# Patient Record
Sex: Female | Born: 1935
Health system: Southern US, Community
[De-identification: ages and names within clinical notes are randomized; demographics above are authoritative.]

## PROBLEM LIST (undated history)

## (undated) ENCOUNTER — Emergency Department (HOSPITAL_COMMUNITY): Admission: EM | Payer: Medicare Other | Source: Home / Self Care

## (undated) DIAGNOSIS — C801 Malignant (primary) neoplasm, unspecified: Secondary | ICD-10-CM

## (undated) DIAGNOSIS — I639 Cerebral infarction, unspecified: Secondary | ICD-10-CM

## (undated) DIAGNOSIS — K219 Gastro-esophageal reflux disease without esophagitis: Secondary | ICD-10-CM

## (undated) DIAGNOSIS — F419 Anxiety disorder, unspecified: Secondary | ICD-10-CM

## (undated) DIAGNOSIS — I10 Essential (primary) hypertension: Secondary | ICD-10-CM

## (undated) DIAGNOSIS — E079 Disorder of thyroid, unspecified: Secondary | ICD-10-CM

## (undated) HISTORY — PX: CHOLECYSTECTOMY: SHX55

## (undated) HISTORY — PX: MASTECTOMY: SHX3

---

## 1997-10-22 ENCOUNTER — Ambulatory Visit (HOSPITAL_COMMUNITY): Admission: RE | Admit: 1997-10-22 | Discharge: 1997-10-22 | Payer: Self-pay | Admitting: *Deleted

## 1998-02-27 ENCOUNTER — Other Ambulatory Visit: Admission: RE | Admit: 1998-02-27 | Discharge: 1998-02-27 | Payer: Self-pay | Admitting: *Deleted

## 1998-06-30 ENCOUNTER — Emergency Department (HOSPITAL_COMMUNITY): Admission: EM | Admit: 1998-06-30 | Discharge: 1998-06-30 | Payer: Self-pay | Admitting: Emergency Medicine

## 1998-06-30 ENCOUNTER — Encounter: Payer: Self-pay | Admitting: Emergency Medicine

## 2000-09-10 ENCOUNTER — Emergency Department (HOSPITAL_COMMUNITY): Admission: EM | Admit: 2000-09-10 | Discharge: 2000-09-10 | Payer: Self-pay | Admitting: Emergency Medicine

## 2000-10-14 ENCOUNTER — Encounter: Payer: Self-pay | Admitting: Specialist

## 2000-10-14 ENCOUNTER — Ambulatory Visit (HOSPITAL_COMMUNITY): Admission: RE | Admit: 2000-10-14 | Discharge: 2000-10-14 | Payer: Self-pay | Admitting: Specialist

## 2000-10-29 ENCOUNTER — Ambulatory Visit (HOSPITAL_COMMUNITY): Admission: RE | Admit: 2000-10-29 | Discharge: 2000-10-29 | Payer: Self-pay | Admitting: Specialist

## 2000-10-29 ENCOUNTER — Encounter: Payer: Self-pay | Admitting: Specialist

## 2003-10-29 ENCOUNTER — Encounter: Admission: RE | Admit: 2003-10-29 | Discharge: 2003-12-31 | Payer: Self-pay | Admitting: Orthopaedic Surgery

## 2003-11-14 ENCOUNTER — Emergency Department (HOSPITAL_COMMUNITY): Admission: EM | Admit: 2003-11-14 | Discharge: 2003-11-14 | Payer: Self-pay | Admitting: Emergency Medicine

## 2004-04-22 ENCOUNTER — Encounter: Admission: RE | Admit: 2004-04-22 | Discharge: 2004-06-18 | Payer: Self-pay | Admitting: Orthopaedic Surgery

## 2004-05-29 ENCOUNTER — Ambulatory Visit: Payer: Self-pay | Admitting: Psychiatry

## 2004-07-09 ENCOUNTER — Emergency Department (HOSPITAL_COMMUNITY): Admission: EM | Admit: 2004-07-09 | Discharge: 2004-07-09 | Payer: Self-pay | Admitting: Emergency Medicine

## 2004-07-24 ENCOUNTER — Ambulatory Visit: Payer: Self-pay | Admitting: Psychiatry

## 2004-09-09 ENCOUNTER — Encounter: Admission: RE | Admit: 2004-09-09 | Discharge: 2004-09-09 | Payer: Self-pay | Admitting: Family Medicine

## 2004-09-30 ENCOUNTER — Ambulatory Visit (HOSPITAL_COMMUNITY): Admission: RE | Admit: 2004-09-30 | Discharge: 2004-09-30 | Payer: Self-pay | Admitting: Gastroenterology

## 2004-10-21 ENCOUNTER — Ambulatory Visit: Payer: Self-pay | Admitting: Psychiatry

## 2004-12-18 ENCOUNTER — Ambulatory Visit: Payer: Self-pay | Admitting: Psychiatry

## 2005-02-19 ENCOUNTER — Ambulatory Visit: Payer: Self-pay | Admitting: Psychiatry

## 2005-04-29 ENCOUNTER — Emergency Department (HOSPITAL_COMMUNITY): Admission: EM | Admit: 2005-04-29 | Discharge: 2005-04-29 | Payer: Self-pay | Admitting: Emergency Medicine

## 2005-05-24 ENCOUNTER — Ambulatory Visit: Payer: Self-pay | Admitting: Psychiatry

## 2005-07-21 ENCOUNTER — Ambulatory Visit (HOSPITAL_COMMUNITY): Payer: Self-pay | Admitting: Psychiatry

## 2005-08-20 ENCOUNTER — Ambulatory Visit (HOSPITAL_COMMUNITY): Payer: Self-pay | Admitting: Psychiatry

## 2005-10-20 ENCOUNTER — Ambulatory Visit (HOSPITAL_COMMUNITY): Payer: Self-pay | Admitting: Psychiatry

## 2005-12-17 ENCOUNTER — Ambulatory Visit (HOSPITAL_COMMUNITY): Payer: Self-pay | Admitting: Psychiatry

## 2006-01-19 ENCOUNTER — Ambulatory Visit (HOSPITAL_COMMUNITY): Payer: Self-pay | Admitting: Psychiatry

## 2006-06-24 ENCOUNTER — Encounter: Admission: RE | Admit: 2006-06-24 | Discharge: 2006-06-24 | Payer: Self-pay | Admitting: Family Medicine

## 2006-07-06 ENCOUNTER — Ambulatory Visit (HOSPITAL_COMMUNITY): Payer: Self-pay | Admitting: Psychiatry

## 2006-10-07 ENCOUNTER — Ambulatory Visit (HOSPITAL_COMMUNITY): Payer: Self-pay | Admitting: Psychiatry

## 2006-10-25 ENCOUNTER — Encounter: Admission: RE | Admit: 2006-10-25 | Discharge: 2006-10-25 | Payer: Self-pay | Admitting: Orthopedic Surgery

## 2006-11-19 ENCOUNTER — Inpatient Hospital Stay (HOSPITAL_COMMUNITY): Admission: RE | Admit: 2006-11-19 | Discharge: 2006-11-25 | Payer: Self-pay | Admitting: Orthopedic Surgery

## 2007-01-06 ENCOUNTER — Encounter: Admission: RE | Admit: 2007-01-06 | Discharge: 2007-03-01 | Payer: Self-pay | Admitting: Orthopedic Surgery

## 2007-01-11 ENCOUNTER — Ambulatory Visit (HOSPITAL_COMMUNITY): Payer: Self-pay | Admitting: Psychiatry

## 2007-08-05 ENCOUNTER — Encounter
Admission: RE | Admit: 2007-08-05 | Discharge: 2007-11-03 | Payer: Self-pay | Admitting: Physical Medicine & Rehabilitation

## 2007-08-09 ENCOUNTER — Ambulatory Visit: Payer: Self-pay | Admitting: Physical Medicine & Rehabilitation

## 2007-08-09 ENCOUNTER — Ambulatory Visit (HOSPITAL_COMMUNITY)
Admission: RE | Admit: 2007-08-09 | Discharge: 2007-08-09 | Payer: Self-pay | Admitting: Physical Medicine & Rehabilitation

## 2007-09-27 ENCOUNTER — Ambulatory Visit: Payer: Self-pay | Admitting: Physical Medicine & Rehabilitation

## 2007-10-24 ENCOUNTER — Ambulatory Visit: Payer: Self-pay | Admitting: Physical Medicine & Rehabilitation

## 2007-11-22 ENCOUNTER — Encounter
Admission: RE | Admit: 2007-11-22 | Discharge: 2008-02-20 | Payer: Self-pay | Admitting: Physical Medicine & Rehabilitation

## 2007-11-24 ENCOUNTER — Ambulatory Visit: Payer: Self-pay | Admitting: Physical Medicine & Rehabilitation

## 2007-12-22 ENCOUNTER — Ambulatory Visit: Payer: Self-pay | Admitting: Physical Medicine & Rehabilitation

## 2008-01-19 ENCOUNTER — Ambulatory Visit: Payer: Self-pay | Admitting: Physical Medicine & Rehabilitation

## 2008-01-24 ENCOUNTER — Ambulatory Visit (HOSPITAL_COMMUNITY)
Admission: RE | Admit: 2008-01-24 | Discharge: 2008-01-24 | Payer: Self-pay | Admitting: Physical Medicine & Rehabilitation

## 2008-02-29 ENCOUNTER — Encounter
Admission: RE | Admit: 2008-02-29 | Discharge: 2008-04-09 | Payer: Self-pay | Admitting: Physical Medicine & Rehabilitation

## 2008-03-05 ENCOUNTER — Ambulatory Visit: Payer: Self-pay | Admitting: Physical Medicine & Rehabilitation

## 2008-04-09 ENCOUNTER — Encounter
Admission: RE | Admit: 2008-04-09 | Discharge: 2008-04-09 | Payer: Self-pay | Admitting: Physical Medicine & Rehabilitation

## 2008-06-21 ENCOUNTER — Emergency Department (HOSPITAL_COMMUNITY): Admission: EM | Admit: 2008-06-21 | Discharge: 2008-06-21 | Payer: Self-pay | Admitting: Emergency Medicine

## 2008-06-30 ENCOUNTER — Emergency Department (HOSPITAL_COMMUNITY): Admission: EM | Admit: 2008-06-30 | Discharge: 2008-06-30 | Payer: Self-pay | Admitting: Emergency Medicine

## 2008-11-05 ENCOUNTER — Emergency Department (HOSPITAL_COMMUNITY): Admission: EM | Admit: 2008-11-05 | Discharge: 2008-11-05 | Payer: Self-pay | Admitting: Emergency Medicine

## 2009-06-06 ENCOUNTER — Ambulatory Visit (HOSPITAL_COMMUNITY): Admission: RE | Admit: 2009-06-06 | Discharge: 2009-06-06 | Payer: Self-pay | Admitting: Family Medicine

## 2009-06-14 ENCOUNTER — Ambulatory Visit (HOSPITAL_COMMUNITY): Admission: RE | Admit: 2009-06-14 | Discharge: 2009-06-15 | Payer: Self-pay | Admitting: General Surgery

## 2009-06-14 ENCOUNTER — Encounter (INDEPENDENT_AMBULATORY_CARE_PROVIDER_SITE_OTHER): Payer: Self-pay | Admitting: General Surgery

## 2009-10-12 ENCOUNTER — Inpatient Hospital Stay (HOSPITAL_COMMUNITY): Admission: EM | Admit: 2009-10-12 | Discharge: 2009-10-17 | Payer: Self-pay | Admitting: Emergency Medicine

## 2009-10-15 ENCOUNTER — Ambulatory Visit: Payer: Self-pay | Admitting: Psychiatry

## 2009-10-16 ENCOUNTER — Ambulatory Visit: Payer: Self-pay | Admitting: Psychiatry

## 2010-04-27 ENCOUNTER — Encounter: Payer: Self-pay | Admitting: Orthopedic Surgery

## 2010-04-27 ENCOUNTER — Encounter: Payer: Self-pay | Admitting: Family Medicine

## 2010-05-20 ENCOUNTER — Emergency Department (HOSPITAL_COMMUNITY)
Admission: EM | Admit: 2010-05-20 | Discharge: 2010-05-20 | Disposition: A | Discharge status: Home / Self Care | Payer: Medicare Other | Attending: Emergency Medicine | Admitting: Emergency Medicine

## 2010-05-20 DIAGNOSIS — Z79899 Other long term (current) drug therapy: Secondary | ICD-10-CM | POA: Insufficient documentation

## 2010-05-20 DIAGNOSIS — F411 Generalized anxiety disorder: Secondary | ICD-10-CM | POA: Insufficient documentation

## 2010-05-20 DIAGNOSIS — M549 Dorsalgia, unspecified: Secondary | ICD-10-CM | POA: Insufficient documentation

## 2010-05-20 DIAGNOSIS — L089 Local infection of the skin and subcutaneous tissue, unspecified: Secondary | ICD-10-CM | POA: Insufficient documentation

## 2010-05-20 DIAGNOSIS — G8929 Other chronic pain: Secondary | ICD-10-CM | POA: Insufficient documentation

## 2010-06-22 LAB — URINALYSIS, ROUTINE W REFLEX MICROSCOPIC
Glucose, UA: NEGATIVE mg/dL
Hgb urine dipstick: NEGATIVE
Nitrite: NEGATIVE
Protein, ur: NEGATIVE mg/dL
Specific Gravity, Urine: 1.006 (ref 1.005–1.030)

## 2010-06-22 LAB — CARDIAC PANEL(CRET KIN+CKTOT+MB+TROPI)
CK, MB: 0.9 ng/mL (ref 0.3–4.0)
Relative Index: INVALID (ref 0.0–2.5)
Relative Index: INVALID (ref 0.0–2.5)
Total CK: 63 U/L (ref 7–177)
Total CK: 69 U/L (ref 7–177)
Troponin I: <0.01 ng/mL (ref 0.00–0.06)

## 2010-06-22 LAB — POCT I-STAT, CHEM 8
Chloride: 100 mEq/L (ref 96–112)
HCT: 44 % (ref 36.0–46.0)
Hemoglobin: 15 g/dL (ref 12.0–15.0)
Potassium: 3.9 mEq/L (ref 3.5–5.1)
Sodium: 137 mEq/L (ref 135–145)

## 2010-06-22 LAB — BASIC METABOLIC PANEL
Calcium: 9.4 mg/dL (ref 8.4–10.5)
GFR calc non Af Amer: >60 mL/min (ref >60)
Glucose, Bld: 109 mg/dL — ABNORMAL HIGH (ref 70–99)
Sodium: 136 mEq/L (ref 135–145)

## 2010-06-22 LAB — COMPREHENSIVE METABOLIC PANEL
ALT: 13 U/L (ref 0–35)
Albumin: 3.7 g/dL (ref 3.5–5.2)
Alkaline Phosphatase: 50 U/L (ref 39–117)
BUN: 6 mg/dL (ref 6–23)
GFR calc Af Amer: >60 mL/min (ref >60)
Potassium: 3.6 mEq/L (ref 3.5–5.1)
Sodium: 137 mEq/L (ref 135–145)
Total Protein: 6.6 g/dL (ref 6.0–8.3)

## 2010-06-22 LAB — CBC
HCT: 41 % (ref 36.0–46.0)
Hemoglobin: 14 g/dL (ref 12.0–15.0)
Hemoglobin: 14.1 g/dL (ref 12.0–15.0)
MCH: 31.7 pg (ref 26.0–34.0)
MCHC: 33.7 g/dL (ref 30.0–36.0)
MCV: 92.5 fL (ref 78.0–100.0)
Platelets: 170 10*3/uL (ref 150–400)
Platelets: 170 10*3/uL (ref 150–400)
RBC: 4.43 MIL/uL (ref 3.87–5.11)
RDW: 13.1 % (ref 11.5–15.5)
RDW: 13.9 % (ref 11.5–15.5)
WBC: 3.3 10*3/uL — ABNORMAL LOW (ref 4.0–10.5)
WBC: 4.2 10*3/uL (ref 4.0–10.5)

## 2010-06-22 LAB — T3, FREE: T3, Free: 2.8 pg/mL (ref 2.3–4.2)

## 2010-06-22 LAB — DIFFERENTIAL
Basophils Relative: 1 % (ref 0–1)
Eosinophils Absolute: 0.1 10*3/uL (ref 0.0–0.7)
Lymphs Abs: 0.7 10*3/uL (ref 0.7–4.0)
Neutro Abs: 2.3 10*3/uL (ref 1.7–7.7)
Neutrophils Relative %: 70 % (ref 43–77)

## 2010-06-22 LAB — URINE CULTURE: Colony Count: 35000

## 2010-06-22 LAB — POCT CARDIAC MARKERS
CKMB, poc: <1 ng/mL — ABNORMAL LOW (ref 1.0–8.0)
Myoglobin, poc: 68.2 ng/mL (ref 12–200)

## 2010-06-27 LAB — URINALYSIS, ROUTINE W REFLEX MICROSCOPIC
Glucose, UA: NEGATIVE mg/dL
Nitrite: NEGATIVE
pH: 8 (ref 5.0–8.0)

## 2010-06-27 LAB — URINE MICROSCOPIC-ADD ON

## 2010-06-27 LAB — CBC
RBC: 3.76 MIL/uL — ABNORMAL LOW (ref 3.87–5.11)
WBC: 3.3 10*3/uL — ABNORMAL LOW (ref 4.0–10.5)

## 2010-06-27 LAB — GLUCOSE, CAPILLARY
Glucose-Capillary: 156 mg/dL — ABNORMAL HIGH (ref 70–99)
Glucose-Capillary: 88 mg/dL (ref 70–99)
Glucose-Capillary: 91 mg/dL (ref 70–99)

## 2010-06-27 LAB — BASIC METABOLIC PANEL
Calcium: 9.4 mg/dL (ref 8.4–10.5)
Creatinine, Ser: 0.94 mg/dL (ref 0.4–1.2)
GFR calc Af Amer: >60 mL/min (ref >60)
GFR calc non Af Amer: 59 mL/min — ABNORMAL LOW (ref >60)

## 2010-07-12 LAB — BASIC METABOLIC PANEL
CO2: 30 mEq/L (ref 19–32)
Chloride: 98 mEq/L (ref 96–112)
GFR calc Af Amer: >60 mL/min (ref >60)
Sodium: 136 mEq/L (ref 135–145)

## 2010-07-12 LAB — DIFFERENTIAL
Basophils Relative: 1 % (ref 0–1)
Eosinophils Absolute: 0.1 10*3/uL (ref 0.0–0.7)
Eosinophils Relative: 2 % (ref 0–5)
Monocytes Absolute: 0.3 10*3/uL (ref 0.1–1.0)
Monocytes Relative: 8 % (ref 3–12)

## 2010-07-12 LAB — URINALYSIS, ROUTINE W REFLEX MICROSCOPIC
Bilirubin Urine: NEGATIVE
Hgb urine dipstick: NEGATIVE
Protein, ur: NEGATIVE mg/dL
Urobilinogen, UA: 1 mg/dL (ref 0.0–1.0)

## 2010-07-12 LAB — CBC
Hemoglobin: 12 g/dL (ref 12.0–15.0)
MCHC: 32.4 g/dL (ref 30.0–36.0)
MCV: 91.8 fL (ref 78.0–100.0)
RBC: 4.03 MIL/uL (ref 3.87–5.11)

## 2010-07-17 LAB — LIPASE, BLOOD: Lipase: 23 U/L (ref 11–59)

## 2010-07-17 LAB — URINE MICROSCOPIC-ADD ON

## 2010-07-17 LAB — URINALYSIS, ROUTINE W REFLEX MICROSCOPIC
Leukocytes, UA: NEGATIVE
Nitrite: NEGATIVE
Nitrite: POSITIVE — AB
Specific Gravity, Urine: <1.005 — ABNORMAL LOW (ref 1.005–1.030)
Specific Gravity, Urine: <1.005 — ABNORMAL LOW (ref 1.005–1.030)
Urobilinogen, UA: 0.2 mg/dL (ref 0.0–1.0)
pH: 5.5 (ref 5.0–8.0)
pH: 7 (ref 5.0–8.0)

## 2010-07-17 LAB — COMPREHENSIVE METABOLIC PANEL
AST: 19 U/L (ref 0–37)
CO2: 25 mEq/L (ref 19–32)
Calcium: 8.6 mg/dL (ref 8.4–10.5)
Creatinine, Ser: 0.6 mg/dL (ref 0.4–1.2)
GFR calc Af Amer: >60 mL/min (ref >60)
GFR calc non Af Amer: >60 mL/min (ref >60)

## 2010-07-17 LAB — CBC
MCHC: 33.8 g/dL (ref 30.0–36.0)
MCV: 94.3 fL (ref 78.0–100.0)
RBC: 3.34 MIL/uL — ABNORMAL LOW (ref 3.87–5.11)

## 2010-07-17 LAB — DIFFERENTIAL
Eosinophils Relative: 0 % (ref 0–5)
Lymphocytes Relative: 15 % (ref 12–46)
Lymphs Abs: 0.7 10*3/uL (ref 0.7–4.0)
Neutro Abs: 3.5 10*3/uL (ref 1.7–7.7)
Neutrophils Relative %: 78 % — ABNORMAL HIGH (ref 43–77)

## 2010-07-17 LAB — URINE CULTURE

## 2010-07-17 LAB — AMMONIA: Ammonia: 10 umol/L — ABNORMAL LOW (ref 11–35)

## 2010-08-19 NOTE — Procedures (Signed)
Alexis Reese, SAYED                ACCOUNT NO.:  [id number]   MEDICAL RECORD NO.:  [id number]         PATIENT TYPE:  AECP   LOCATION:                                 FACILITY:   PHYSICIAN:  Erick Colace, M.D.DATE OF BIRTH:  April 04, 1935   DATE OF PROCEDURE:  DATE OF DISCHARGE:                               OPERATIVE REPORT   This is a left L3-4 transforaminal lumbar epidural steroid injection  under fluoroscopic guidance.   INDICATIONS:  Left greater than right-sided back, hip, and lower  extremity pain.  Pain is only partially responsive to medication  management and other conservative care.  Has only had partial response  to sacroiliac injection as well as with trochanteric bursa injection.  Pain interferes with self-care mobility.  She had no significant relief  with facet injections either.  Last lumbar imaging study did show  lateral recess stenosis at L2-3, right greater than left, bilateral at  L3-4 and L5-S1   Informed consent was obtained after describing the risks and benefits of  the procedure with the patient.  These include bleeding, bruising, and  infection.  She elects to proceed and has given written consent.  The  patient placed prone on fluoroscopy table.  Betadine prep, sterilely  draped.  A 25-gauge, 1-1/2 inch needle was used to anesthetize the skin  and subcutaneous tissue with 1% lidocaine x2 mL, and 22-gauge 3-1/2-inch  spinal needle was inserted at left L3-4 intervertebral foramen, AP,  lateral, and oblique imaging utilized.  Initially used the more superior  approach, but excess of osteophytes therefore made foraminal approach.  After appropriate fluoroscopic images were obtained, Omnipaque 180  showed good nerve roots as well as epidural spread, followed by  injection of 1 mL of 10 mg/mL dexamethasone and 1.5 mL of 1% MPF  lidocaine.  The patient tolerated the procedure well.  Post-injection  instructions given.      Erick Colace, M.D.  Electronically Signed     AEK/MEDQ  D:  12/22/2007 12:32:51  T:  12/23/2007 00:29:16  Job:  [id number]   cc:   Ursula Beath, MD  Fax: [phone]

## 2010-08-19 NOTE — Discharge Summary (Signed)
NAMELIBORIA, PUTNAM                ACCOUNT NO.:  [id number]   MEDICAL RECORD NO.:  [id number]          PATIENT TYPE:  INP   LOCATION:  1616                         FACILITY:  [facility]   PHYSICIAN:  Marlowe Kays, M.D.  DATE OF BIRTH:  [birth year]   DATE OF ADMISSION:  11/19/2006  DATE OF DISCHARGE:  11/25/2006                               DISCHARGE SUMMARY   PREOPERATIVE DIAGNOSIS:  Spinal and foraminal stenosis, L2-3 to sacrum,  with possible disk herniation at L2-3 left.   POSTOPERATIVE DIAGNOSIS:  Spinal stenosis, L2 to sacrum.   OTHER DIAGNOSIS:  Psychosocial disorder with depression and anxiety.   OPERATION:  On November 19, 2006, central and foraminal decompression, L2  to sacrum, with inspection of L2-3 disk left.   HOSPITAL COURSE:  This 76 year old female who lives with her brother was  admitted at this time following the above surgery for back and bilateral  leg pain.  The surgery went uneventfully; however, she has been very  slow to ambulate and has become a management problem here in the  hospital because of inability to be rehabbed.  She lives with her  brother, who will be unable to take care of her.  Because of this, we  are making arrangements for her to spend 30 days or less at a skilled  nursing facility until she is independent enough to return home with her  brother.   DISCHARGE MEDICATIONS:  Basically those that she came in on:   1. Hydrocodone one to two q.4h. p.r.n. pain.  2. Simvastatin 40 mg each morning.  3. Avandamet 07/998 mg a.m. and p.m.  4. Gabapentin 300 mg t.i.d.  5. Clonazepam 0.5 mg t.i.d.  6. Paroxetine HCl 30 mg a.m. and p.m.  7. Seroquel 200 mg h.s.  8. Seroquel 50 mg one-half to one tablet once during day if needed.  9. Benazepril HCl 20 mg at noon.  10.Etodolac 500 mg noon and h.s.  11.Alphagan P 0.15% one drop each eye a.m. and p.m.  12.Lumigan 0.03% eye drops one drop each eye at bedtime.   DISCHARGE PLAN:  Return to my office 2  weeks from surgery.  She may be  showering at this time and ambulatory as tolerated.   CONDITION AT DISCHARGE:  Stable.           ______________________________  Marlowe Kays, M.D.     JA/MEDQ  D:  11/25/2006  T:  11/25/2006  Job:  [id number]

## 2010-08-19 NOTE — Assessment & Plan Note (Signed)
HISTORY:  A 76 year old female with history of lumbar pain.  She has had  central and foraminal decompression in 2008.  She has had pain post  procedure.  She has had mainly left-sided lumbar and left hip pain.  She  did have left hip x-ray that I ordered performed on Aug 09, 2007, which  was normal.  She has had lumbar spine imaging demonstrating old T8  compression fracture and old L2 compression fracture.  She has  degenerative changes at L5-S1 level with anterior slip of L5 on S1.  She  has some osteopenia noted.   She had only about one day relief with the trochanteric bursa injection,  left hip.   She has had no new problems in the interval time.  Taking some Tylenol.  Her sleep is good.  She does need a walker to ambulate, needs some help  with shopping, meal prep, household duties, bathing, toileting, and  dressing.   REVIEW OF SYSTEMS:  Positive for constipation.   SOCIAL HISTORY:  Widow, lives with her brother.   PHYSICAL EXAMINATION:  VITALS:  Blood pressure is 133/63, pulse 99,  respirations 18, and O2 sat 96% on room air.   On reviewing her x-ray of the lumbar spine, she does have wide  laminectomy noted posteriorly at L2, L3, L4, and L5 levels.  She has  facet arthropathy.  SI joints appear sclerotic, right greater than left.   On examination, she has tenderness to palpation at lumbar paraspinals  and over the left trochanteric bursa area.  She has 74% forward flexion  and 50% extension pain with extension on bending towards the left side.  The lower extremity strength is 4/5 bilaterally and symmetric.  She has  some proximal hip weakness, unable to climb a step.   IMPRESSION:  1. Lumbar spondylosis without myelopathy.  2. Lumbar post laminectomy pain syndrome.   PLAN:  1. We will go ahead and do lumbar medial branch blocks at L4-L5 and L5-      S1 levels.  2. Increase tramadol to q.i.d. 50 mg.  She may take up to 500 mg      Tylenol with liquids per  day.      Erick Colace, M.D.  Electronically Signed     AEK/MedQ  D:  09/27/2007 12:18:36  T:  09/29/2007 06:02:37  Job #:  [id number]   cc:   Ursula Beath, MD  Fax: [phone]

## 2010-08-19 NOTE — Group Therapy Note (Signed)
REASON FOR CONSULTATION:  Lumbar disc disease, osteopenia.   HISTORY:  Patient is a 76 year old female who is a rather poor  historian.  She gets off track and I need to ask her repeated questions.  She had her brother help her with the health and history form.  He  indicates the onset of patient's current complaints which are back pain  and left lower extremity pain starting in 2004.  However, Ms. Alexis Reese  stated it started just prior to her lumbar surgery in 2008.  In either  case, she denies a case of trauma to her spine, no falls, no motor  vehicle accidents.  Her primary complaint, as noted above, is back pain,  left hip as well as left lower extremity pain.  She describes the pain  as an aching pain, does not really put a number on it in terms of  severity.  She could walk 5-10 minutes at a time.  She does not climb  steps since her surgery.  She has been using a walker since her surgery  and she does not drive.  She is retired.  She indicates  disability  since 2004 which is basically at age 75.  She needs assistance with  household duties.   REVIEW OF SYSTEMS:  Positive for trouble walking, depression, anxiety,  constipation, poor appetite.   PRIOR STUDIES:  Nerve study, x-ray, CT, MRI, bone scan.  I do not have  copies of all this.  I was able to look some up in the [facility]-chart.   She has an operative port.  Aug 19, 2006, procedure central and  foraminal decompression L2 through the sacrum, Gill procedure at L5.  This is per Dr. Simonne Come with assistance from Dr. Darrelyn Hillock.   Imaging studies reviewed included diagnostic lumbar spine from the OR  November 19, 2006.  She has had a CT of her lumbar spine of her contrast  which is preoperative showing lateral recess stenosis right greater than  left, at L2-3 retrolisthesis 3-4 mm, at L3-4 mild narrowing of both  lateral recesses without definite neural compression at L4-5, mild  narrowing of lateral  recesses but no definite compression, in L5-S1  anterolisthesis a 12 mm stenosis canal and lateral recesses.  She had  compression fractures L2 and L4.  Prior studies also include MRI of the  right hip and chondroma present, no left hip MRI.  She has mild facet  arthropathy bilaterally noted on MRI of lumbar spine in 2002.  She has  had bone density showing osteopenia in lumbar and left hip.  She has had  no fractures in the hip area but as noted per CT, what appeared to be  old fractures at L2 and L4.   CURRENT MEDICATIONS:  1. Amitriptyline 30 nightly.  2. Darvocet-N 100 four tablets per day which helps for 4-5 hours at a      time.  3. Zocor for cholesterol.  4. Avandamet for diabetes.  5. Seroquel for psychiatric issues.  6. Clonazepam for anxiety.  7. Fluoxetine for anxiety/depression.  8. Benazepril for hypertension.   ALLERGIES:  None known.   SOCIAL HISTORY:  Single, lives with her brother.  She is actually  widowed per her report.   FAMILY HISTORY:  Diabetes in father.  Cancer in brother.   PHYSICAL EXAMINATION:  VITAL SIGNS:  Blood pressure 128/58, pulse 100,  respiratory rate 18, O2 saturation 97% on room air.  GENERAL APPEARANCE:  Patient  is an elderly female, appearing somewhat  older than her stated age.  NEUROLOGIC:  Slow to respond to questions, need to repeat but does not  appear to be hard of hearing.  She is anxious.  Her gait is with a  walker but she was able to without the walker within the exam room short  distance, short step length, widened base of support.  Motor strength is  5/5 bilateral deltoid, biceps, triceps, grip as well as hip flexors,  knee extensors, ankle dorsiflexor.  Lumbar range of motion is  essentially nil in terms of extension and 50% flexion.  Her sensation is  normal bilateral upper and lower extremities.  Deep tendon reflexes are  normal bilateral upper and lower extremities.  Strength is normal  bilateral upper and lower  extremities.  She has no pain with hip range  of motion bilaterally, internal and external rotation only mildly  reduced bilaterally.  She has tenderness over the left greater  trochanter but not on the right side.   IMPRESSION:  History of lumbar stenosis with chronic left lower  extremity postoperative pain.  Her exam shows no focal neurologic  deficits.  She does have tenderness to palpation over the trochanteric  bursa and she may have trochanteric bursitis which we will inject today,  which will also serve as a diagnostic clue as well.  Check hip x-ray on  the left as well as low back x-ray to see if she has any new compression  fractures.  Need MRI to further assess age of fractures should pain  persist.   I will see her back in about a month.  We will change her from  amitriptyline to nortriptyline to reduce anticholinergic side effects.  Bump it up to 50 mg.  Change Darvocet to tramadol 50 t.i.d.   Thank you for this interesting consultation.  Will keep you apprised of  her situation.  Additional considerations would be to do a sacroiliac  injection to see if this may be a pain generator versus facet injections  which did show some arthropathy on CT scan.      Erick Colace, M.D.  Electronically Signed     AEK/MedQ  D:  08/09/2007 16:25:01  T:  08/09/2007 16:54:46  Job #:  [id number]   cc:   Ursula Beath, MD  Fax: [phone]   Marlowe Kays, M.D.  Fax: [phone]

## 2010-08-19 NOTE — Procedures (Signed)
[name], Alexis Reese                ACCOUNT NO.:  [id number]   MEDICAL RECORD NO.:  [id number]           PATIENT TYPE:   LOCATION:                                 FACILITY:   PHYSICIAN:  Erick Colace, M.D.DATE OF BIRTH:  09-19-1934   DATE OF PROCEDURE:  02/16/2008  DATE OF DISCHARGE:                               OPERATIVE REPORT   This is an S1 transforaminal lumbar epidural steroid injection under  fluoroscopic guidance.   INDICATIONS:  Lumbosacral radiculitis.  History of postlaminectomy  syndrome with left lower extremity pain and foraminal narrowing at L5  nerve root.   On fluoroscopy view, appears that the foramen would be non-accessible by  transforaminal route.  She has scarring from laminectomy midline, and  therefore S1 route was selected.   Informed consent was obtained after describing risks and benefits of the  procedure with the patient.  These include bleeding, bruising, and  infection.  Her pain is only partially relieved by medication management  including narcotic analgesics and interferes with self-care and  mobility.   The patient placed prone on fluoroscopy table.  Betadine prep, sterile  drape.  A 25-gauge inch and a half needle was used to anesthetize the  skin and subcutaneous tissue with 1% lidocaine x2 mL.  Then, a 22-gauge  3-1/2-inch spinal needle was inserted in the left S1 foramen.  AP and  lateral imaging utilized.  Omnipaque 180 under live fluoro demonstrated  good nerve root spread as well as foraminal spread followed by injection  of 1 mL of 1% MPF lidocaine and 1 mL of 10 mg/mL dexamethasone.  The  patient tolerated the procedure well.  Pre and postinjection vitals  stable.  Good post injection relief ~50%.  Postinjection instructions  given.  She will see me in follow up in 1 month.      Erick Colace, M.D.  Electronically Signed     AEK/MEDQ  D:  03/05/2008 13:45:27  T:  03/06/2008 03:37:45  Job:  [id number]

## 2010-08-19 NOTE — Procedures (Signed)
[name], Alexis Reese                ACCOUNT NO.:  [id number]   MEDICAL RECORD NO.:  [id number]         PATIENT TYPE:  AECP   LOCATION:                                 FACILITY:   PHYSICIAN:  Erick Colace, M.D.DATE OF BIRTH:  1934/06/26   DATE OF PROCEDURE:  01/19/2008  DATE OF DISCHARGE:                               OPERATIVE REPORT   PROCEDURE:  Left L2-L3 transforaminal lumbar epidural steroid injection  under fluoroscopic guidance.   INDICATIONS:  Left lumbar radiculitis with back and left hip pain.  X-  ray of the left hip is negative.   Informed consent was obtained after describing risks and benefits of the  procedure with the patient.  These include bleeding, bruising, and  infection.  She elects to proceed and has given written consent.  The  patient placed prone on fluoroscopy table.  Betadine prep, sterile  drape.  A 25-gauge 1-1/2 inch needle was used to anesthetize skin and  subcu tissue, 1% lidocaine x2 mL.  Then, a 22-gauge 3-1/2-inch spinal  needle was inserted in left L2-3 intervertebral foramen retroneural  approach.  AP, lateral, and oblique images were utilized.  Omnipaque 180  under live fluoro demonstrated good nerve root as well as medial spread.  Then, a solution containing 1 mL of 10 mg/mL dexamethasone and 1 mL of  1% lidocaine injected MPF.  The patient tolerated the procedure well.  Pre- and postinjection vitals stable.  Postinjection instructions given.      Erick Colace, M.D.  Electronically Signed     AEK/MEDQ  D:  01/19/2008 12:10:12  T:  01/20/2008 01:01:23  Job:  [id number]

## 2010-08-19 NOTE — Procedures (Signed)
NAMENOVEMBER, SYPHER                ACCOUNT NO.:  [id number]   MEDICAL RECORD NO.:  [id number]          PATIENT TYPE:  REC   LOCATION:  TPC                          FACILITY:  [facility]   PHYSICIAN:  Erick Colace, M.D.DATE OF BIRTH:  08/07/1934   DATE OF PROCEDURE:  08/09/2007  DATE OF DISCHARGE:                               OPERATIVE REPORT   This is left hip trochanteric bursa injection.   INDICATION:  Trochanteric bursitis with pain only partial responsive to  medication management.  She is diabetic and elderly and poor candidate  for nonsteroidals.   Informed consent was obtained after describing risks and benefits of the  procedure to the patient.  These include bleeding, bruising or  infection.  She elects to proceed and has given written consent.  The  patient placed in the right lateral decubitus position.  Area marked and  prepped with Betadine with assistance of nursing, entered with a 25-  gauge 1-1/2-inch needle inserted to bone, contact slightly withdrawn.  Then, a solution containing 1 mL of 40 mL per mL Depo-Medrol and 4 mL of  1% lidocaine injected.  The patient tolerated the procedure well.  Post  injection structures given.  I will see her back in approximately one  month to see corticosteroid effect.  However, immediate post injection  affect is minimal in terms of alleviating the left hip pain which leads  me to believe it may be more sacroiliac in nature.      Erick Colace, M.D.  Electronically Signed     AEK/MEDQ  D:  08/09/2007 16:27:57  T:  08/09/2007 17:46:44  Job:  [id number]

## 2010-08-19 NOTE — Procedures (Signed)
Alexis Reese, CHAI                ACCOUNT NO.:  [id number]   MEDICAL RECORD NO.:  [id number]           PATIENT TYPE:   LOCATION:                                 FACILITY:   PHYSICIAN:  Erick Colace, M.D.DATE OF BIRTH:  03-27-1935   DATE OF PROCEDURE:  10/24/2007  DATE OF DISCHARGE:                               OPERATIVE REPORT   PROCEDURE:  Left L5 dorsal ramus injection, left L4 medial branch block,  left L3 medial branch block, under fluoroscopic guidance.   INDICATION:  Lumbar post-laminectomy pain syndrome with facet  arthropathy.  Medial branch blocks performed to further assess pain.  She has only had 1-day of relief with trochanteric bursa injection.  She  takes medications for analgesia with only partial relief.  Pain  interferes with self-care mobility.   Informed consent was obtained after describing risks and benefits of the  procedure to the patient.  These include bleeding, bruising, and  infection.  She elects to proceed and has given written consent.  The  patient placed prone on fluoroscopy table.  Betadine prep, sterile  draped.  A 25-gauge 1-1/2-inch needle was used to anesthetize skin and  subcu tissue with 1% lidocaine x2 mL at each of 3 sites.  Then, a 22-  gauge 3-1/2-inch spinal needle was inserted under fluoroscopic guidance  targeting first at the left S1 SAP sacroiliac junction.  Bone contact  made and confirmed with lateral imaging.  Omnipaque 180 x 0.5 mL  demonstrated no intravascular uptake and 0.5 mL of 2% MPF lidocaine  injected.  Then, the left L5 SAP transverse process junction was  targeted.  Bone contact made and confirmed with lateral imaging.  Omnipaque 180 x 0.5 mL demonstrated no intravascular uptake and 0.5 mL  of 2% lidocaine solution injected.  Then, the left L4 SAP transverse  process junction was targeted.  Bone contact made and confirmed with  lateral imaging.  Omnipaque 180 x 0.5 mL demonstrated no intravascular  uptake, 0.5  mL of the 2% lidocaine solution was injected.  The patient  tolerated the procedure well.  Pre- and post-injection, vitals stable.  Post-injection instructions given.  We will schedule for repeat  injection.  If this is not particularly helpful, consider sacroiliac.      Erick Colace, M.D.  Electronically Signed     AEK/MEDQ  D:  10/24/2007 14:24:41  T:  10/25/2007 05:49:38  Job:  [id number]

## 2010-08-19 NOTE — H&P (Signed)
NAMEZAKYA, HALABI                ACCOUNT NO.:  [id number]   MEDICAL RECORD NO.:  [id number]          PATIENT TYPE:  INP   LOCATION:  NA                           FACILITY:  [facility]   PHYSICIAN:  Marlowe Kays, M.D.  DATE OF BIRTH:  Mar 03, 1935   DATE OF ADMISSION:  11/19/2006  DATE OF DISCHARGE:                              HISTORY & PHYSICAL   CHIEF COMPLAINT:  Pain in my legs and back.   PRESENT ILLNESS:  A 76 year old white female seen by [place] for continued  progressive problems concerning pain into her lumbar spine and lower  extremities.  We have been treating her conservatively, but her pain is  so interfering with her lifestyle.  She has difficulty sleeping,  difficulty getting about.  CT scan done on October 25, 2006 shows spinal  stenosis at all levels from L2-3 all the way to the sacrum.  After much  discussion to the risks and benefits with the patient and her brother,  who accompanies her in all visits, it was decided that she would benefit  from surgical intervention with a decompressive foraminotomy and a  lumbar laminectomy from L2-3 to the sacrum.   PAST MEDICAL HISTORY:  This lady has a mild psychosocial disorder, has a  considerable amount of depression as well as anxiety.  With the upcoming  surgery, of course, her anxiety is enhanced.  Dr. Raquel James in  [place] is her family physician.  She had no medical allergies.   CURRENT MEDICATIONS:  1. Simvastatin 40 mg 1 daily.  2. Avandamet 4 mg/1000 mg tab b.i.d.  3. Gabapentin 300 mg caps b.i.d.,  4. Clonazepam 0.5 mg t.i.d.  5. Paroxetine hydrochloride 30 mg tabs b.i.d.  6. Seroquel 200 mg tab at bedtime daily.  7. Benazepril hydrochloride 20 mg daily.  8. Etodolac 500 mg b.i.d., which she will stop prior to surgery.  9. She also has eye drops which presumably are for some mild glaucoma:      Alphagan 15% 1 drop each eye at 12 hours; Lumigan  0.03%, 1 drop      each eye daily.   PAST SURGERIES:  Breast surgery  in 1985 for cancer, which has no  sequelae.   FAMILY HISTORY:  Positive for diabetes in the father, pancreatic cancer  in the brother.   SOCIAL HISTORY:  She is widowed, retired.  No intake of alcohol or  tobacco products.  She lives at home, and the [employer] [facility] will tend for her after surgery.   REVIEW OF SYSTEMS:  CNS: No seizures or paralysis, numbness, or double  vision.  RESPIRATORY:  No productive cough, no hemoptysis, no shortness  of breath.  CARDIOVASCULAR:  No chest pain, no angina or orthopnea.  GASTROINTESTINAL: No nausea, vomiting, melena, or bloody stools.  She  does have intermittent diarrhea.  GENITOURINARY:  No discharge, dysuria,  or hematuria.  MUSCULOSKELETAL:  Primarily in present illness.   PHYSICAL EXAMINATION:  GENERAL:  Alert, cooperative, friendly, very  anxious and tearful 76 year old white female, who is accompanied by her  brother.  VITAL SIGNS: Blood  pressure 140/86, pulse 80, respirations 12.  HEENT:  Normocephalic.  PERRLA.  EOM intact.  Oropharynx is clear.  Pupils are very small.  NECK:  Supple, no lymphadenopathy.  CHEST:  Clear to auscultation.  No rhonchi, no rales.  HEART:  Regular rate and rhythm.  No murmurs are heard.  ABDOMEN:  Soft, nontender.  Liver and spleen not felt.  GENITALIA/RECTAL/PELVIC/BREASTS:  Not done, not pertinent to present  illness.  EXTREMITIES:  Primarily as in present illness.  There are no  deformities.  Negative straight-leg raise.  Minimal numbness on the left  calf area.   ADMISSION DIAGNOSES:  1. Spinal stenosis L2-S1.  2. Hyperlipidemia.  3. Hypertension.  4. Anxiety and depression.  5. Diabetes (?).   PLAN:  The patient will be admitted for central and foraminal  decompressive lumbar laminectomy from L2-S1.      Dooley L. Cherlynn June.    ______________________________  Marlowe Kays, M.D.    DLU/MEDQ  D:  11/12/2006  T:  11/13/2006  Job:  [id number]   cc:   Ursula Beath,  MD  Fax: [phone]

## 2010-08-19 NOTE — Op Note (Signed)
NAMEJAMIELYNN, WIGLEY                ACCOUNT NO.:  [id number]   MEDICAL RECORD NO.:  [id number]          PATIENT TYPE:  INP   LOCATION:  0002                         FACILITY:  [facility]   PHYSICIAN:  Marlowe Kays, M.D.  DATE OF BIRTH:  May 12, 1934   DATE OF PROCEDURE:  11/19/2006  DATE OF DISCHARGE:                               OPERATIVE REPORT   PREOPERATIVE DIAGNOSES:  1. Spinal and foraminal stenosis L2-3, L3-4, L4-5, and L5-S1.  2. Suspected disk herniation with free fragment L2-3 left.  3. Pars defect spondylolisthesis L5 and S1.   POSTOPERATIVE DIAGNOSES:  1. Spinal and foraminal stenosis L2-3, L3-4, L4-5, and L5-S1.  2. No herniated disk found at L2-3.  3. Pars defect spondylolisthesis L5 and S1.   OPERATION:  Central and foraminal decompression L2 to the sacrum with  Gill procedure at L5 and inspection of L2-3 disk left with no disk  herniation or free fragment found.   SURGEON:  Marlowe Kays, M.D.   ASSISTANT:  Georges Lynch. Darrelyn Hillock, M.D.   ANESTHESIA:  General.   INDICATIONS FOR PROCEDURE:  She was having back and bilateral leg pain,  left greater than right.  Myelogram CT scan has demonstrated  particularly lateral recess stenosis at every level from L2 to the  sacrum but also central stenosis as well and what was felt to be on CAT  scan a disk herniation with free fragment caudally at L2-3 on the left.  She was having more left leg pain than right.   PROCEDURE:  Prophylactic antibiotics, satisfactory general anesthesia,  Foley catheter inserted, prone position on the Wilson frame.  Back was  prepped with DuraPrep, draped in sterile field.  IV employed.  Vertical  midline incision down to the spinous processes with initial localizing x-  ray showing the Kocher clamps, apparently beyond L2 and L4.  Based on  this, we continued dissecting soft tissue off the neural arches going  proximal ward and distal ward over the sacrum.  Two self-retaining  McCullough retractors  were placed.  I then began decompressive  procedure, removing the spinous processes and neural arches at L3, 4 and  5.  At L5, we had to perform a Gill procedure because the neural arch  was very loose.  Working proximally, we took a third x-ray with a second  x-ray in between during the case for localization to attempt to localize  the L2-3 disk which was located based on the x-ray with a probe at L3-4,  and we moved more cephalad to the next interspace, removing additional  bone, essentially all of L2.  The L3 nerve root was freed up and the  foramen identified and was easily movable.  There was no encumbrance on  it.  The L2-3 disk appeared to be firm, and there was no free fragment  noted.  At this point, wound was irrigated with sterile saline, Gelfoam  soaked in thrombin was placed over the dura and self-retaining  retractors removed.  I then placed a quarter inch Penrose drain with a  safety pin to the right posterior buttock area and carefully  closed the  wound around it with interrupted #1 Vicryl in the fascia and deep  subcutaneous tissue with 2-0 Vicryl superficial subcutaneous tissue  staples in the skin.  Betadine and dry sterile dressing were applied.  She was  given 15 mg of Toradol IV and was taken to recovery in satisfactory  condition with no known complications.  Estimated blood loss was between  900 and 1000 mL, and she started with a hemoglobin of 11.3, and we  planned to give her 2 units of blood.           ______________________________  Marlowe Kays, M.D.     JA/MEDQ  D:  11/19/2006  T:  11/20/2006  Job:  [id number]

## 2010-08-19 NOTE — Procedures (Signed)
NAMEWILENE, Alexis Reese                ACCOUNT NO.:  [id number]   MEDICAL RECORD NO.:  [id number]          PATIENT TYPE:  REC   LOCATION:  TPC                          FACILITY:  [facility]   PHYSICIAN:  Erick Colace, M.D.DATE OF BIRTH:  1935/04/06   DATE OF PROCEDURE:  DATE OF DISCHARGE:                               OPERATIVE REPORT   PROCEDURE:  This is a left sacroiliac injection under fluoroscopic  guidance.   INDICATIONS:  Left hip and buttock pain did not have much relief with  left L5 dorsal ramus, L4 medial branch, and L3 medial branch performed  October 24, 2007.  Pain persists despite medication management.   Informed consent was obtained after describing risks and benefits of the  procedure with the patient.  These include bleeding, bruising, and  infection.  She elects to proceed and has given written consent.  The  patient placed prone on the fluoroscopy table.  Betadine prep, sterile  drape.  A 25-gauge 1-1/2-inch needle was used to anesthetize the skin  and subcutaneous tissue with 1% lidocaine x2 mL.  Then, a 25-gauge 3-  inch spinal needle was inserted under fluoroscopic guidance starting at  the left sacroiliac joint.  AP, lateral, and oblique imaging utilized.  Omnipaque 180 on live fluoro demonstrated good intravascular uptake.  Then, a solution containing 1 mL of 2% lidocaine and 0.5 mL of 40 mg/mL  Depo-Medrol were injected.  The patient tolerated the procedure well.  Pre and post injection vitals stable.  Post injection instructions  given.  We will see her back in 1 month for re-injection.      Erick Colace, M.D.  Electronically Signed     AEK/MEDQ  D:  11/24/2007 13:33:46  T:  11/25/2007 04:33:33  Job:  [id number]

## 2010-08-22 NOTE — Consult Note (Signed)
Alexis Reese, IVENS NO.:  [id number]   MEDICAL RECORD NO.:  [id number]                   PATIENT TYPE:  EMS   LOCATION:  ED                                   FACILITY:  [facility]   PHYSICIAN:  Vania Rea, M.D.              DATE OF BIRTH:  May 21, 1934   DATE OF CONSULTATION:  11/14/2003  DATE OF DISCHARGE:                                   CONSULTATION   PHYSICIANS:  Primary care physician:  Teena Irani. Arlyce Dice, M.D.  Requesting physician:  Hilario Quarry, M.D.  Consulting physician:  Vania Rea, M.D.   REASON FOR CONSULTATION:  Garbled speech since this morning.   HISTORY OF PRESENT ILLNESS:  This is a 76 year old lady with a history of  unclear psychiatric issues that involves depression.  She is on Seroquel and  Paxil.  She is disabled because of psychiatric reasons.  She is a widow with  two children, lives at home with her brother, and requires a sitter for four  hours every day.  The patient was apparently able to self care until this  morning when she woke up at 10 o'clock, her usual time, and exhibited  garbled speech.  The patient was also confused about what to do, was unable  to wipe herself after using the bathroom.  The patient is prone to attacks  of anxiety by her own admission and also according to her brother.  Currently, in attempting to interview the patient, she does not make sense  most of the time, and she is using either neologisms or is just confused or  is unable to get out the words she really needs to get out.   It is to be noted that, when the nurse was placing a Foley catheter in the  patient, under the stress of the placement of the Foley catheter, her speech  became perfectly clear, and she expressed her desires and feelings with  extreme clarity.  There is no history of fever, cough, cold, or chest pains.  There is no history of focal weakness, syncope, or seizures.  She does have  episodic headaches.  She has no  past history of strokes.   PAST MEDICAL HISTORY:  1. Cancer of the breast in 1995 with no history of recurrence.  2. Chronic depression.  3. Recurrent anxiety at times.   MEDICATIONS:  Neurontin, Fosamax, Paxil, Seroquel, and Xanax.   ALLERGIES:  No known drug allergies.   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  Lives at home with her  brother here in [place].  Denies any history of tobacco, alcohol, or drug  use.   FAMILY HISTORY:  Significant for mother who died at the age of 70 from  Alzheimer's, a father who died at 96 from a stroke.  Eight siblings, five  sisters and three brothers.  A sister died of esophageal cancer, brother  died of pancreatic cancer.  One brother is diabetic.  No psychiatric  problems in the family.   REVIEW OF SYMPTOMS:  Significant only headaches and constipation.  Her  brother denies any abnormality in any other system.   PHYSICAL EXAMINATION:  GENERAL:  Obese, agitated, elderly lady sitting up in  the stretcher.  VITAL SIGNS:  Temperature 99.5, pulse 97, respirations 24, blood pressure  124/90.  Saturating at 98%.  HEENT:  Pupils round, equal, and reactive.  NECK:  There is no lymphadenopathy.  She has a thick neck.  CHEST:  Clear to auscultation bilaterally.  CARDIOVASCULAR:  Regular rhythm without murmur.  ABDOMEN:  Obese, soft, and nontender.  There are no masses.  EXTREMITIES:  Without edema.  She has 2+ pulses bilaterally.  NEUROLOGIC:  She has grade 5 power throughout, and there is loss of  sensation.  She has no focal neurologic deficits.  Her speech remains  garbled.  Her swallowing and gag reflexes are intact.   LABORATORY AND X-RAY DATA:  CBC is completely normal with a white count of  5.5, hematocrit 41.2, and MCV 91, platelets 202.  Likewise, her serum  chemistries are also completely normal with potassium 3.7, sodium 137, BUN  9, creatinine  0.7.  Glucose is somewhat elevated at 188.  Liver function  tests are normal.  Acetaminophen, alcohol, and salicylate were undetectable.   IMPRESSION:  This represents probably an acute anxiety attack.   PLAN:  1. Will get an ABG and give her some Ativan.  2. However, we will check her thyroid function with free T4 and TSH in case     of some abnormality there.  3. We will also do a B12.  4. We will also try to get a STAT MRI to be sure there is no problem there.  5. CT scan of her brain ordered and revealed no acute abnormality.  6. Will also get a STAT consult from the ACT team.  If they consider to be     an acute psychiatric problem, then we will clear her for transfer to a     psychiatric facility.      ___________________________________________                                            Vania Rea, M.D.   LC/MEDQ  D:  11/14/2003  T:  11/14/2003  Job:  [id number]   cc:   Teena Irani. Arlyce Dice, M.D.  [address]  [place]  [place] [address]  Fax: [phone]   Hilario Quarry, M.D.  [address]  [place]  [place] [address]  Fax: [phone]

## 2010-08-22 NOTE — Procedures (Signed)
[name], Alexis Reese                          ACCOUNT NO.:  [id number]   MEDICAL RECORD NO.:  [id number]                   PATIENT TYPE:  EMS   LOCATION:  ED                                   FACILITY:  [facility]   PHYSICIAN:  Edward L. Juanetta Gosling, M.D.             DATE OF BIRTH:  [birth year]   DATE OF PROCEDURE:  DATE OF DISCHARGE:                                EKG INTERPRETATION   DATE AND TIME OF PROCEDURE:  At 1519 on November 14, 2003.   The rhythm is sinus rhythm with a rate in the 90s.  There is a great deal of  artifact in the tracing.  The computer has read PACs and PVCs which I do not  see.  There is an intraventricular conduction delay.  The computer has also  read right and left atrial enlargement.  I can not see the P waves well  enough to be certain about that.  Abnormal electrocardiogram.      ___________________________________________                                            Oneal Deputy. Juanetta Gosling, M.D.   ELH/MEDQ  D:  11/14/2003  T:  11/14/2003  Job:  [id number]

## 2011-01-03 ENCOUNTER — Encounter: Payer: Self-pay | Admitting: *Deleted

## 2011-01-03 ENCOUNTER — Emergency Department (HOSPITAL_COMMUNITY): Payer: Medicare Other

## 2011-01-03 ENCOUNTER — Emergency Department (HOSPITAL_COMMUNITY)
Admission: EM | Admit: 2011-01-03 | Discharge: 2011-01-03 | Disposition: A | Discharge status: Home / Self Care | Payer: Medicare Other | Attending: Emergency Medicine | Admitting: Emergency Medicine

## 2011-01-03 DIAGNOSIS — N760 Acute vaginitis: Secondary | ICD-10-CM | POA: Insufficient documentation

## 2011-01-03 DIAGNOSIS — R1032 Left lower quadrant pain: Secondary | ICD-10-CM | POA: Insufficient documentation

## 2011-01-03 HISTORY — DX: Essential (primary) hypertension: I10

## 2011-01-03 HISTORY — DX: Gastro-esophageal reflux disease without esophagitis: K21.9

## 2011-01-03 HISTORY — DX: Malignant (primary) neoplasm, unspecified: C80.1

## 2011-01-03 LAB — URINALYSIS, ROUTINE W REFLEX MICROSCOPIC
Bilirubin Urine: NEGATIVE
Ketones, ur: NEGATIVE mg/dL
Nitrite: NEGATIVE
Protein, ur: NEGATIVE mg/dL
Urobilinogen, UA: 0.2 mg/dL (ref 0.0–1.0)

## 2011-01-03 LAB — COMPREHENSIVE METABOLIC PANEL
ALT: 14 U/L (ref 0–35)
Alkaline Phosphatase: 76 U/L (ref 39–117)
BUN: 11 mg/dL (ref 6–23)
CO2: 29 mEq/L (ref 19–32)
Chloride: 96 mEq/L (ref 96–112)
GFR calc Af Amer: >60 mL/min (ref >60)
GFR calc non Af Amer: >60 mL/min (ref >60)
Glucose, Bld: 104 mg/dL — ABNORMAL HIGH (ref 70–99)
Potassium: 4.2 mEq/L (ref 3.5–5.1)
Sodium: 134 mEq/L — ABNORMAL LOW (ref 135–145)
Total Bilirubin: 0.4 mg/dL (ref 0.3–1.2)
Total Protein: 7.4 g/dL (ref 6.0–8.3)

## 2011-01-03 LAB — DIFFERENTIAL
Eosinophils Absolute: 0 10*3/uL (ref 0.0–0.7)
Lymphocytes Relative: 18 % (ref 12–46)
Lymphs Abs: 1 10*3/uL (ref 0.7–4.0)
Monocytes Relative: 7 % (ref 3–12)
Neutrophils Relative %: 74 % (ref 43–77)

## 2011-01-03 LAB — CBC
Hemoglobin: 14.7 g/dL (ref 12.0–15.0)
MCH: 30.6 pg (ref 26.0–34.0)
RBC: 4.81 MIL/uL (ref 3.87–5.11)
WBC: 5.5 10*3/uL (ref 4.0–10.5)

## 2011-01-03 LAB — URINE MICROSCOPIC-ADD ON

## 2011-01-03 LAB — LIPASE, BLOOD: Lipase: 33 U/L (ref 11–59)

## 2011-01-03 MED ORDER — SODIUM CHLORIDE 0.9 % IV SOLN
999.0000 mL | INTRAVENOUS | Status: DC
  Administered 2011-01-03: 1000 mL via INTRAVENOUS

## 2011-01-03 MED ORDER — ONDANSETRON HCL 4 MG/2ML IJ SOLN
4.0000 mg | Freq: Once | INTRAMUSCULAR | Status: AC
  Administered 2011-01-03: 4 mg via INTRAVENOUS
  Filled 2011-01-03: qty 2

## 2011-01-03 MED ORDER — FENTANYL CITRATE 0.05 MG/ML IJ SOLN
50.0000 ug | Freq: Once | INTRAMUSCULAR | Status: AC
  Administered 2011-01-03: 50 ug via INTRAVENOUS
  Filled 2011-01-03: qty 2

## 2011-01-03 MED ORDER — HYDROCODONE-ACETAMINOPHEN 5-325 MG PO TABS
2.0000 | ORAL_TABLET | ORAL | Status: AC | PRN
Start: 2011-01-03 — End: 2011-01-13

## 2011-01-03 MED ORDER — HYDROCORTISONE 1 % EX CREA: TOPICAL_CREAM | CUTANEOUS | Status: AC

## 2011-01-03 MED ORDER — LORAZEPAM 1 MG PO TABS
2.0000 mg | ORAL_TABLET | Freq: Once | ORAL | Status: AC
  Administered 2011-01-03: 2 mg via ORAL

## 2011-01-03 MED ORDER — HYDROMORPHONE HCL 1 MG/ML IJ SOLN: 1.0000 mg | Freq: Once | INTRAMUSCULAR | Status: DC

## 2011-01-03 MED ORDER — IOHEXOL 300 MG/ML  SOLN
100.0000 mL | Freq: Once | INTRAMUSCULAR | Status: AC | PRN
  Administered 2011-01-03: 100 mL via INTRAVENOUS

## 2011-01-03 NOTE — ED Notes (Addendum)
Pt c/o lower abd pain described as aching and has been off and on for over a week. Pt denies bowel and urinary difficulties. Pt has belched a couple of times while in triage. Pt also c/o vaginal itching and burning.

## 2011-01-03 NOTE — ED Notes (Signed)
Pt becomes tearful easily, stating she is on nerve pills "but they don't seem to be helping" per pt. Pt states intermittent abdominal pain x 1 week. Brother states she c/o of discomfort when wiping after urinating. NAD at this time.

## 2011-01-03 NOTE — ED Notes (Signed)
Pt left er  No noted distress no stated needs

## 2011-01-03 NOTE — ED Notes (Signed)
Redness noted to vaginal area during in and out cath procedure. No discharge noted. Dr. Radford Pax aware.

## 2011-01-03 NOTE — ED Provider Notes (Addendum)
History     CSN: [id number] Arrival date & time: 01/03/2011  5:57 PM  Chief Complaint  Patient presents with  . Abdominal Pain     HPI 76 year old female comes in with complaints to the left lower abdomen which been off and on for over a week. Denies bowel or urinary difficulties. Denies fever. Has had increased belching. Also  had some vaginal itching and burning. Patient has long history of back in the hip difficulties. Has had numerous MRI CT scans done.   Past Medical History  Diagnosis Date  . Cancer   . Hypertension   . Diabetes mellitus   . GERD (gastroesophageal reflux disease)     Past Surgical History  Procedure Date  . Mastectomy   . Cholecystectomy     History reviewed. No pertinent family history.  History  Substance Use Topics  . Smoking status: Never Smoker   . Smokeless tobacco: Not on file  . Alcohol Use: No    OB History    Grav Para Term Preterm Abortions TAB SAB Ect Mult Living                  Review of Systems  Constitutional: Negative for fever and chills.  All other systems reviewed and are negative.    Allergies  Review of patient's allergies indicates no known allergies.  Home Medications  No current outpatient prescriptions on file.  BP 127/101  Pulse 106  Temp(Src) 98 F (36.7 C) (Oral)  Resp 18  SpO2 98%  Physical Exam  Nursing note and vitals reviewed. Constitutional: She is oriented to person, place, and time. She appears well-developed and well-nourished. No distress.  HENT:  Head: Normocephalic and atraumatic.  Eyes: Pupils are equal, round, and reactive to light.  Neck: Normal range of motion.  Cardiovascular: Normal rate and intact distal pulses.   Pulmonary/Chest: No respiratory distress.  Abdominal: Soft. Normal appearance and bowel sounds are normal. She exhibits no mass. There is no tenderness. There is no rebound and no guarding.  Musculoskeletal: Normal range of motion.  Neurological: She is alert and  oriented to person, place, and time. No cranial nerve deficit.  Skin: Skin is warm and dry. No rash noted.  Psychiatric: She has a normal mood and affect. Her behavior is normal.      ED Course  Procedures (including critical care time)  Labs Reviewed  COMPREHENSIVE METABOLIC PANEL - Abnormal; Notable for the following:    Sodium 134 (*)    Glucose, Bld 104 (*)    All other components within normal limits  URINALYSIS, ROUTINE W REFLEX MICROSCOPIC - Abnormal; Notable for the following:    Hgb urine dipstick TRACE (*)    All other components within normal limits  CBC  DIFFERENTIAL  LIPASE, BLOOD  URINE MICROSCOPIC-ADD ON   Results for orders placed during the hospital encounter of 01/03/11  CBC      Component Value Range   WBC 5.5  4.0 - 10.5 (K/uL)   RBC 4.81  3.87 - 5.11 (MIL/uL)   Hemoglobin 14.7  12.0 - 15.0 (g/dL)   HCT 62.9  52.8 - 41.3 (%)   MCV 91.1  78.0 - 100.0 (fL)   MCH 30.6  26.0 - 34.0 (pg)   MCHC 33.6  30.0 - 36.0 (g/dL)   RDW 24.4  01.0 - 27.2 (%)   Platelets 168  150 - 400 (K/uL)  DIFFERENTIAL      Component Value Range   Neutrophils Relative 74  43 - 77 (%)   Neutro Abs 4.0  1.7 - 7.7 (K/uL)   Lymphocytes Relative 18  12 - 46 (%)   Lymphs Abs 1.0  0.7 - 4.0 (K/uL)   Monocytes Relative 7  3 - 12 (%)   Monocytes Absolute 0.4  0.1 - 1.0 (K/uL)   Eosinophils Relative 1  0 - 5 (%)   Eosinophils Absolute 0.0  0.0 - 0.7 (K/uL)   Basophils Relative 1  0 - 1 (%)   Basophils Absolute 0.0  0.0 - 0.1 (K/uL)  COMPREHENSIVE METABOLIC PANEL      Component Value Range   Sodium 134 (*) 135 - 145 (mEq/L)   Potassium 4.2  3.5 - 5.1 (mEq/L)   Chloride 96  96 - 112 (mEq/L)   CO2 29  19 - 32 (mEq/L)   Glucose, Bld 104 (*) 70 - 99 (mg/dL)   BUN 11  6 - 23 (mg/dL)   Creatinine, Ser 9.56  0.50 - 1.10 (mg/dL)   Calcium 9.8  8.4 - 21.3 (mg/dL)   Total Protein 7.4  6.0 - 8.3 (g/dL)   Albumin 4.1  3.5 - 5.2 (g/dL)   AST 16  0 - 37 (U/L)   ALT 14  0 - 35 (U/L)    Alkaline Phosphatase 76  39 - 117 (U/L)   Total Bilirubin 0.4  0.3 - 1.2 (mg/dL)   GFR calc non Af Amer >60  >60 (mL/min)   GFR calc Af Amer >60  >60 (mL/min)  LIPASE, BLOOD      Component Value Range   Lipase 33  11 - 59 (U/L)  URINALYSIS, ROUTINE W REFLEX MICROSCOPIC      Component Value Range   Color, Urine YELLOW  YELLOW    Appearance CLEAR  CLEAR    Specific Gravity, Urine 1.015  1.005 - 1.030    pH 7.5  5.0 - 8.0    Glucose, UA NEGATIVE  NEGATIVE (mg/dL)   Hgb urine dipstick TRACE (*) NEGATIVE    Bilirubin Urine NEGATIVE  NEGATIVE    Ketones, ur NEGATIVE  NEGATIVE (mg/dL)   Protein, ur NEGATIVE  NEGATIVE (mg/dL)   Urobilinogen, UA 0.2  0.0 - 1.0 (mg/dL)   Nitrite NEGATIVE  NEGATIVE    Leukocytes, UA NEGATIVE  NEGATIVE   URINE MICROSCOPIC-ADD ON      Component Value Range   WBC, UA 3-6  <3 (WBC/hpf)   RBC / HPF 3-6  <3 (RBC/hpf)   Ct Abdomen Pelvis W Contrast  01/03/2011  *RADIOLOGY REPORT*  Clinical Data: Abdominal pain, prior appendectomy, history of breast cancer  CT ABDOMEN AND PELVIS WITH CONTRAST  Technique:  Multidetector CT imaging of the abdomen and pelvis was performed following the standard protocol during bolus administration of intravenous contrast.  Contrast: 100 ml Omnipaque-300 IV  Comparison: Lumbar spine MRI dated 10/12/2009  Findings: Lung bases are essentially clear.  Coronary atherosclerosis.  Liver is notable for a 10 mm probable cyst in the posterior segment right hepatic lobe (series 2/image 25).  Spleen, pancreas, and adrenal glands are within normal limits.  Status post hysterectomy.  No intrahepatic ductal dilatation. Common bile duct tapers normally at the level of the pancreatic head.  Kidneys are notable for small bilateral renal cysts.  No hydronephrosis.  No evidence of bowel obstruction.  No evidence of abdominal aortic aneurysm.  No abdominopelvic ascites.  No suspicious abdominopelvic lymphadenopathy.  Uterus is unremarkable.  No adnexal masses.   Bladder is within normal limits.  Extensive degenerative changes of the visualized thoracolumbar spine.  Stable complete compression at T12 with retropulsion. Stable mild compression at L2 and mild to moderate compression at L4.  Stable grade I retrolisthesis of L2 on L3.  Stable grade III anterolisthesis of L5 on S1 with bilateral pars defects.  IMPRESSION: No evidence of bowel obstruction.  No evidence of metastatic disease in the abdomen/pelvis.  Extensive degenerative changes in the thoracolumbar spine, as described above, stable from prior MRI.  Original Report Authenticated By: Charline Bills, M.D.     Diagnoses:  #1 abdominal pain unknown etiology   MDM          Nelia Shi, MD 01/03/11 2018  Nelia Shi, MD 01/03/11 2020  Nelia Shi, MD 01/03/11 1610  Nelia Shi, MD 01/28/11 [phone]

## 2011-01-16 LAB — TYPE AND SCREEN

## 2011-01-16 LAB — ABO/RH: ABO/RH(D): A NEG

## 2011-01-19 LAB — COMPREHENSIVE METABOLIC PANEL
BUN: 7
CO2: 32
Calcium: 9.3
Chloride: 104
Creatinine, Ser: 0.71
GFR calc non Af Amer: >60
Glucose, Bld: 122 — ABNORMAL HIGH
Total Bilirubin: 0.4

## 2011-01-19 LAB — URINALYSIS, ROUTINE W REFLEX MICROSCOPIC
Glucose, UA: NEGATIVE
Hgb urine dipstick: NEGATIVE
Ketones, ur: NEGATIVE
Protein, ur: NEGATIVE
Urobilinogen, UA: 1

## 2011-01-19 LAB — DIFFERENTIAL
Basophils Absolute: 0
Lymphocytes Relative: 26
Lymphs Abs: 0.8
Neutrophils Relative %: 63

## 2011-01-19 LAB — PROTIME-INR
INR: 0.9
Prothrombin Time: 12.3

## 2011-01-19 LAB — CBC
HCT: 33.4 — ABNORMAL LOW
Hemoglobin: 11.3 — ABNORMAL LOW
MCHC: 33.9
MCV: 93.6
RBC: 3.56 — ABNORMAL LOW

## 2011-10-16 ENCOUNTER — Other Ambulatory Visit (HOSPITAL_COMMUNITY): Payer: Self-pay | Admitting: Adult Health Nurse Practitioner

## 2011-10-16 ENCOUNTER — Ambulatory Visit (HOSPITAL_COMMUNITY)
Admission: RE | Admit: 2011-10-16 | Discharge: 2011-10-16 | Disposition: A | Discharge status: Home / Self Care | Payer: Medicare Other | Source: Ambulatory Visit | Attending: Adult Health Nurse Practitioner | Admitting: Adult Health Nurse Practitioner

## 2011-10-16 DIAGNOSIS — M79609 Pain in unspecified limb: Secondary | ICD-10-CM | POA: Insufficient documentation

## 2011-10-16 DIAGNOSIS — M25569 Pain in unspecified knee: Secondary | ICD-10-CM

## 2012-05-24 ENCOUNTER — Encounter (HOSPITAL_COMMUNITY): Payer: Self-pay | Admitting: *Deleted

## 2012-05-24 ENCOUNTER — Inpatient Hospital Stay (HOSPITAL_COMMUNITY)
Admission: EM | Admit: 2012-05-24 | Discharge: 2012-05-27 | DRG: 690 | Disposition: A | Discharge status: Skilled Nursing Facility | Payer: Medicare Other | Attending: Internal Medicine | Admitting: Internal Medicine

## 2012-05-24 ENCOUNTER — Emergency Department (HOSPITAL_COMMUNITY): Payer: Medicare Other

## 2012-05-24 DIAGNOSIS — F418 Other specified anxiety disorders: Secondary | ICD-10-CM

## 2012-05-24 DIAGNOSIS — Z981 Arthrodesis status: Secondary | ICD-10-CM

## 2012-05-24 DIAGNOSIS — B9620 Unspecified Escherichia coli [E. coli] as the cause of diseases classified elsewhere: Secondary | ICD-10-CM | POA: Diagnosis present

## 2012-05-24 DIAGNOSIS — B999 Unspecified infectious disease: Secondary | ICD-10-CM | POA: Diagnosis present

## 2012-05-24 DIAGNOSIS — Z79899 Other long term (current) drug therapy: Secondary | ICD-10-CM

## 2012-05-24 DIAGNOSIS — E039 Hypothyroidism, unspecified: Secondary | ICD-10-CM | POA: Diagnosis present

## 2012-05-24 DIAGNOSIS — R159 Full incontinence of feces: Secondary | ICD-10-CM

## 2012-05-24 DIAGNOSIS — E119 Type 2 diabetes mellitus without complications: Secondary | ICD-10-CM

## 2012-05-24 DIAGNOSIS — M4150 Other secondary scoliosis, site unspecified: Secondary | ICD-10-CM | POA: Diagnosis present

## 2012-05-24 DIAGNOSIS — L538 Other specified erythematous conditions: Secondary | ICD-10-CM | POA: Diagnosis present

## 2012-05-24 DIAGNOSIS — F329 Major depressive disorder, single episode, unspecified: Secondary | ICD-10-CM | POA: Diagnosis present

## 2012-05-24 DIAGNOSIS — M47817 Spondylosis without myelopathy or radiculopathy, lumbosacral region: Secondary | ICD-10-CM | POA: Diagnosis present

## 2012-05-24 DIAGNOSIS — M47816 Spondylosis without myelopathy or radiculopathy, lumbar region: Secondary | ICD-10-CM

## 2012-05-24 DIAGNOSIS — S3730XA Unspecified injury of urethra, initial encounter: Secondary | ICD-10-CM

## 2012-05-24 DIAGNOSIS — H109 Unspecified conjunctivitis: Secondary | ICD-10-CM

## 2012-05-24 DIAGNOSIS — F3289 Other specified depressive episodes: Secondary | ICD-10-CM | POA: Diagnosis present

## 2012-05-24 DIAGNOSIS — R32 Unspecified urinary incontinence: Secondary | ICD-10-CM | POA: Diagnosis present

## 2012-05-24 DIAGNOSIS — M4156 Other secondary scoliosis, lumbar region: Secondary | ICD-10-CM

## 2012-05-24 DIAGNOSIS — A498 Other bacterial infections of unspecified site: Secondary | ICD-10-CM | POA: Diagnosis present

## 2012-05-24 DIAGNOSIS — F411 Generalized anxiety disorder: Secondary | ICD-10-CM | POA: Diagnosis present

## 2012-05-24 DIAGNOSIS — E669 Obesity, unspecified: Secondary | ICD-10-CM

## 2012-05-24 DIAGNOSIS — L304 Erythema intertrigo: Secondary | ICD-10-CM

## 2012-05-24 DIAGNOSIS — R5381 Other malaise: Secondary | ICD-10-CM | POA: Diagnosis present

## 2012-05-24 DIAGNOSIS — Z9010 Acquired absence of unspecified breast and nipple: Secondary | ICD-10-CM

## 2012-05-24 DIAGNOSIS — K5900 Constipation, unspecified: Secondary | ICD-10-CM

## 2012-05-24 DIAGNOSIS — N390 Urinary tract infection, site not specified: Principal | ICD-10-CM

## 2012-05-24 DIAGNOSIS — I10 Essential (primary) hypertension: Secondary | ICD-10-CM | POA: Diagnosis present

## 2012-05-24 DIAGNOSIS — M549 Dorsalgia, unspecified: Secondary | ICD-10-CM

## 2012-05-24 DIAGNOSIS — R9431 Abnormal electrocardiogram [ECG] [EKG]: Secondary | ICD-10-CM

## 2012-05-24 DIAGNOSIS — Z9181 History of falling: Secondary | ICD-10-CM

## 2012-05-24 DIAGNOSIS — Z6831 Body mass index (BMI) 31.0-31.9, adult: Secondary | ICD-10-CM

## 2012-05-24 DIAGNOSIS — R531 Weakness: Secondary | ICD-10-CM

## 2012-05-24 LAB — URINE MICROSCOPIC-ADD ON

## 2012-05-24 LAB — URINALYSIS, ROUTINE W REFLEX MICROSCOPIC
Glucose, UA: NEGATIVE mg/dL
Specific Gravity, Urine: 1.015 (ref 1.005–1.030)
pH: 8 (ref 5.0–8.0)

## 2012-05-24 LAB — CBC WITH DIFFERENTIAL/PLATELET
Basophils Absolute: 0 10*3/uL (ref 0.0–0.1)
Basophils Relative: 0 % (ref 0–1)
Eosinophils Relative: 1 % (ref 0–5)
HCT: 41.4 % (ref 36.0–46.0)
Lymphocytes Relative: 10 % — ABNORMAL LOW (ref 12–46)
MCHC: 33.6 g/dL (ref 30.0–36.0)
MCV: 93.7 fL (ref 78.0–100.0)
Monocytes Absolute: 0.4 10*3/uL (ref 0.1–1.0)
Platelets: 188 10*3/uL (ref 150–400)
RDW: 13 % (ref 11.5–15.5)
WBC: 7.5 10*3/uL (ref 4.0–10.5)

## 2012-05-24 LAB — COMPREHENSIVE METABOLIC PANEL
ALT: 13 U/L (ref 0–35)
AST: 14 U/L (ref 0–37)
Albumin: 3.5 g/dL (ref 3.5–5.2)
Calcium: 9.5 mg/dL (ref 8.4–10.5)
Creatinine, Ser: 0.8 mg/dL (ref 0.50–1.10)
Sodium: 139 mEq/L (ref 135–145)
Total Protein: 7 g/dL (ref 6.0–8.3)

## 2012-05-24 MED ORDER — ONDANSETRON HCL 4 MG PO TABS: 4.0000 mg | ORAL_TABLET | Freq: Four times a day (QID) | ORAL | Status: DC | PRN

## 2012-05-24 MED ORDER — ENOXAPARIN SODIUM 40 MG/0.4ML ~~LOC~~ SOLN
40.0000 mg | SUBCUTANEOUS | Status: DC
  Administered 2012-05-24 – 2012-05-26 (×3): 40 mg via SUBCUTANEOUS
  Filled 2012-05-24 (×3): qty 0.4

## 2012-05-24 MED ORDER — MORPHINE SULFATE 2 MG/ML IJ SOLN
2.0000 mg | Freq: Once | INTRAMUSCULAR | Status: AC
  Administered 2012-05-24: 2 mg via INTRAVENOUS
  Filled 2012-05-24: qty 1

## 2012-05-24 MED ORDER — ASPIRIN EC 81 MG PO TBEC
81.0000 mg | DELAYED_RELEASE_TABLET | Freq: Every day | ORAL | Status: DC
  Administered 2012-05-25 – 2012-05-27 (×3): 81 mg via ORAL
  Filled 2012-05-24 (×3): qty 1

## 2012-05-24 MED ORDER — PAROXETINE HCL 10 MG PO TABS
15.0000 mg | ORAL_TABLET | Freq: Two times a day (BID) | ORAL | Status: DC
  Administered 2012-05-25 – 2012-05-27 (×4): 15 mg via ORAL
  Filled 2012-05-24 (×10): qty 1.5
  Filled 2012-05-24 (×2): qty 0.5

## 2012-05-24 MED ORDER — BENAZEPRIL HCL 10 MG PO TABS
20.0000 mg | ORAL_TABLET | Freq: Every day | ORAL | Status: DC
  Administered 2012-05-24 – 2012-05-26 (×2): 20 mg via ORAL
  Filled 2012-05-24 (×3): qty 2

## 2012-05-24 MED ORDER — FLUTICASONE PROPIONATE 50 MCG/ACT NA SUSP
2.0000 | Freq: Every day | NASAL | Status: DC
  Administered 2012-05-25 – 2012-05-27 (×3): 2 via NASAL
  Filled 2012-05-24: qty 16

## 2012-05-24 MED ORDER — FLUCONAZOLE IN SODIUM CHLORIDE 200-0.9 MG/100ML-% IV SOLN
INTRAVENOUS | Status: AC
  Filled 2012-05-24: qty 100

## 2012-05-24 MED ORDER — SODIUM CHLORIDE 0.9 % IV BOLUS (SEPSIS)
1000.0000 mL | Freq: Once | INTRAVENOUS | Status: AC
  Administered 2012-05-24: 1000 mL via INTRAVENOUS

## 2012-05-24 MED ORDER — QUETIAPINE FUMARATE 25 MG PO TABS
25.0000 mg | ORAL_TABLET | Freq: Two times a day (BID) | ORAL | Status: DC
  Administered 2012-05-24 – 2012-05-26 (×3): 25 mg via ORAL
  Filled 2012-05-24 (×4): qty 1

## 2012-05-24 MED ORDER — RISPERIDONE 0.5 MG PO TABS
0.2500 mg | ORAL_TABLET | Freq: Three times a day (TID) | ORAL | Status: DC
  Administered 2012-05-24 – 2012-05-26 (×5): 0.25 mg via ORAL
  Filled 2012-05-24 (×6): qty 1

## 2012-05-24 MED ORDER — SORBITOL 70 % SOLN: 30.0000 mL | Freq: Every day | Status: DC | PRN

## 2012-05-24 MED ORDER — NYSTATIN-TRIAMCINOLONE 100000-0.1 UNIT/GM-% EX CREA
TOPICAL_CREAM | Freq: Two times a day (BID) | CUTANEOUS | Status: DC
  Administered 2012-05-25 – 2012-05-27 (×6): via TOPICAL
  Filled 2012-05-24 (×2): qty 15

## 2012-05-24 MED ORDER — FLUCONAZOLE 100 MG PO TABS: 100.0000 mg | ORAL_TABLET | Freq: Every day | ORAL | Status: DC

## 2012-05-24 MED ORDER — LORATADINE 10 MG PO TABS
10.0000 mg | ORAL_TABLET | Freq: Every morning | ORAL | Status: DC
  Administered 2012-05-25 – 2012-05-27 (×3): 10 mg via ORAL
  Filled 2012-05-24 (×3): qty 1

## 2012-05-24 MED ORDER — SENNA 8.6 MG PO TABS
1.0000 | ORAL_TABLET | Freq: Two times a day (BID) | ORAL | Status: DC
  Administered 2012-05-24 – 2012-05-27 (×5): 8.6 mg via ORAL
  Filled 2012-05-24 (×6): qty 1

## 2012-05-24 MED ORDER — SODIUM CHLORIDE 0.9 % IV SOLN
INTRAVENOUS | Status: AC
  Administered 2012-05-24: 20:00:00 via INTRAVENOUS

## 2012-05-24 MED ORDER — FLUCONAZOLE 100 MG PO TABS
100.0000 mg | ORAL_TABLET | Freq: Every day | ORAL | Status: DC
  Administered 2012-05-25 – 2012-05-27 (×3): 100 mg via ORAL
  Filled 2012-05-24 (×3): qty 1

## 2012-05-24 MED ORDER — DEXTROSE 5 % IV SOLN
1.0000 g | Freq: Once | INTRAVENOUS | Status: AC
  Administered 2012-05-24: 1 g via INTRAVENOUS
  Filled 2012-05-24: qty 10

## 2012-05-24 MED ORDER — HYDROCODONE-ACETAMINOPHEN 5-325 MG PO TABS
1.0000 | ORAL_TABLET | ORAL | Status: DC | PRN
  Administered 2012-05-24 – 2012-05-25 (×2): 1 via ORAL
  Administered 2012-05-25 (×2): 2 via ORAL
  Filled 2012-05-24: qty 2
  Filled 2012-05-24: qty 1
  Filled 2012-05-24: qty 2
  Filled 2012-05-24: qty 1

## 2012-05-24 MED ORDER — ALBUTEROL SULFATE (5 MG/ML) 0.5% IN NEBU: 2.5000 mg | INHALATION_SOLUTION | RESPIRATORY_TRACT | Status: DC | PRN

## 2012-05-24 MED ORDER — DEXTROSE 5 % IV SOLN
1.0000 g | INTRAVENOUS | Status: DC
  Administered 2012-05-25 – 2012-05-26 (×2): 1 g via INTRAVENOUS
  Filled 2012-05-24 (×2): qty 10

## 2012-05-24 MED ORDER — ALPRAZOLAM 0.5 MG PO TABS
0.5000 mg | ORAL_TABLET | Freq: Three times a day (TID) | ORAL | Status: DC | PRN
  Administered 2012-05-24 – 2012-05-25 (×2): 0.5 mg via ORAL
  Filled 2012-05-24 (×2): qty 1

## 2012-05-24 MED ORDER — ONDANSETRON HCL 4 MG/2ML IJ SOLN: 4.0000 mg | Freq: Four times a day (QID) | INTRAMUSCULAR | Status: DC | PRN

## 2012-05-24 MED ORDER — NORTRIPTYLINE HCL 25 MG PO CAPS
25.0000 mg | ORAL_CAPSULE | Freq: Every day | ORAL | Status: DC
  Administered 2012-05-24: 25 mg via ORAL
  Filled 2012-05-24 (×2): qty 1

## 2012-05-24 MED ORDER — LEVOTHYROXINE SODIUM 100 MCG PO TABS
100.0000 ug | ORAL_TABLET | Freq: Every evening | ORAL | Status: DC
  Administered 2012-05-25 – 2012-05-27 (×4): 100 ug via ORAL
  Filled 2012-05-24 (×5): qty 1

## 2012-05-24 MED ORDER — ALUM & MAG HYDROXIDE-SIMETH 200-200-20 MG/5ML PO SUSP: 30.0000 mL | Freq: Four times a day (QID) | ORAL | Status: DC | PRN

## 2012-05-24 MED ORDER — THIAMINE HCL 100 MG/ML IJ SOLN
Freq: Once | INTRAVENOUS | Status: AC
  Administered 2012-05-25: 01:00:00 via INTRAVENOUS
  Filled 2012-05-24: qty 1000

## 2012-05-24 MED ORDER — FLUCONAZOLE IN SODIUM CHLORIDE 200-0.9 MG/100ML-% IV SOLN
200.0000 mg | Freq: Once | INTRAVENOUS | Status: AC
  Administered 2012-05-24: 200 mg via INTRAVENOUS
  Filled 2012-05-24: qty 100

## 2012-05-24 NOTE — ED Notes (Signed)
Pt unable to ambulate. 

## 2012-05-24 NOTE — ED Provider Notes (Addendum)
History  This chart was scribed for Hilario Quarry, MD, by Candelaria Stagers, ED Scribe. This patient was seen in room APA12/APA12 and the patient's care was started at 3:15 PM   CSN: [id number]  Arrival date & time 05/24/12  1503   First MD Initiated Contact with Patient 05/24/12 1513      Chief Complaint  Patient presents with  . Diarrhea     The history is provided by the patient and the EMS personnel. No language interpreter was used.   Alexis Reese is a 78 y.o. female who presents to the Emergency Department, BIBA, complaining of sudden onset of diarrhea that started this morning after waking.  She reports she had one episode of diarrhea.  She denies blood in stool, fever, nausea, or vomiting.  Nurse reports that her family called her PCP who advised her to come to the ED.  She has not eaten today, but states that she ate yesterday.  She denies any current pain.  Pt uses a walker at home. She is unable to ambulate today due to generalized weakness.   She has h/o cancer, HTN, diabetes, GERD, mastectomy, and cholecystectomy.  Brother arrived and states he is unable to get her up today.  Her PCP is Dr. Lysbeth Galas.   Past Medical History  Diagnosis Date  . Cancer   . Hypertension   . Diabetes mellitus   . GERD (gastroesophageal reflux disease)     Past Surgical History  Procedure Laterality Date  . Mastectomy    . Cholecystectomy      No family history on file.  History  Substance Use Topics  . Smoking status: Never Smoker   . Smokeless tobacco: Not on file  . Alcohol Use: No    OB History   Grav Para Term Preterm Abortions TAB SAB Ect Mult Living                  Review of Systems  Constitutional: Negative for fever.  HENT: Negative for neck pain.   Cardiovascular: Negative for chest pain.  Gastrointestinal: Positive for diarrhea. Negative for nausea, vomiting and abdominal pain.  Musculoskeletal: Negative for back pain.  All other systems reviewed and are  negative.    Allergies  Review of patient's allergies indicates no known allergies.  Home Medications  No current outpatient prescriptions on file.  BP 155/99  Pulse 93  Temp(Src) 97.8 F (36.6 C) (Oral)  Resp 20  Wt 175 lb (79.379 kg)  SpO2 94%  Physical Exam  Nursing note and vitals reviewed. Constitutional: She appears well-developed and well-nourished. No distress.  Morbidly obese  HENT:  Head: Normocephalic and atraumatic.  Eyes: EOM are normal. Right eye exhibits no discharge. Left eye exhibits no discharge.  Neck: Normal range of motion. Neck supple.  Cardiovascular: Regular rhythm.   Heart rate 90  Pulmonary/Chest: Effort normal. She has no wheezes. She has no rales.  Right sided mastectomy,   Abdominal: There is no tenderness.  Musculoskeletal: Normal range of motion. She exhibits no tenderness.  Moves extremities equally.    Neurological: She is alert. She has normal reflexes. GCS eye subscore is 4. GCS verbal subscore is 5. GCS motor subscore is 6.  Oriented to person and month.   Strength is equal throughout.   Skin: Skin is warm and dry. She is not diaphoretic.  Skin break down lower abdomen with erythema well delineated borders, moist, under pannus  Psychiatric:       ED Course  Procedures  DIAGNOSTIC STUDIES: Oxygen Saturation is 94% on room air, normal by my interpretation.    COORDINATION OF CARE:  3:28 PM Ordered: CBC with Differential; Comprehensive metabolic panel; Lipase, blood, Urinalysis, routine w reflex microscopic 4:14 PM Recheck: Talked with pt's family members who states they are having difficulty caring for her at home due to immobility.  Family expresses desire for rehab.   Labs Reviewed  CBC WITH DIFFERENTIAL - Abnormal; Notable for the following:    Neutrophils Relative 84 (*)    Lymphocytes Relative 10 (*)    All other components within normal limits  COMPREHENSIVE METABOLIC PANEL - Abnormal; Notable for the following:     CO2 33 (*)    Glucose, Bld 151 (*)    GFR calc non Af Amer 70 (*)    GFR calc Af Amer 82 (*)    All other components within normal limits  URINALYSIS, ROUTINE W REFLEX MICROSCOPIC - Abnormal; Notable for the following:    APPearance CLOUDY (*)    Hgb urine dipstick MODERATE (*)    Protein, ur TRACE (*)    Leukocytes, UA SMALL (*)    All other components within normal limits  URINE MICROSCOPIC-ADD ON - Abnormal; Notable for the following:    Bacteria, UA MANY (*)    All other components within normal limits  URINE CULTURE  LIPASE, BLOOD   Dg Chest 1 View  05/24/2012  *RADIOLOGY REPORT*  Clinical Data: Weakness, diarrhea  CHEST - 1 VIEW  Comparison: 10/12/2009  Findings: Previous mastectomy and axillary lymph node dissection on the right.  Low lung volumes with an elevated right hemidiaphragm. Stable heart size and vascularity.  Negative for pneumonia, collapse, consolidation, effusion or pneumothorax.  IMPRESSION: Low volume exam and an elevated right hemidiaphragm.  No acute chest process.   Original Report Authenticated By: Judie Petit. Miles Costain, M.D.      No diagnosis found.    Date: 05/24/2012  Rate: 88  Rhythm: normal sinus rhythm  QRS Axis: left  Intervals: normal  ST/T Wave abnormalities: nsst  Conduction Disutrbances:inferior q waves c.w. prior infarct  Narrative Interpretation:   Old EKG Reviewed: changes noted I personally performed the services described in this documentation, which was scribed in my presence. The recorded information has been reviewed and considered.   MDM  Patient with uti here.  Urine cultured and iv rocephin one gram infusing.   Attempted to ambulate but patient unable to sit up in bed independently.      Discussed with Dr. Karilyn Cota and plan admit to medsurg bed for antibiotics and ambulation.   Hilario Quarry, MD 05/24/12 1610  Hilario Quarry, MD 05/24/12 9604

## 2012-05-24 NOTE — ED Notes (Signed)
Requests pain medications, MD made aware, no new orders received.

## 2012-05-24 NOTE — ED Notes (Signed)
Pt states that she woke up this morning with diarrhea, no other complaints.

## 2012-05-24 NOTE — ED Notes (Signed)
Pt c/o generalized abdominal pain.  Hospitalist at bedside.  No nausea or vomiting noted at present time.

## 2012-05-24 NOTE — ED Notes (Signed)
Brother states that he is no longer able to handle his sister at home, she is unable to walk by herself, states that she has fell multiple times in the last few months and he can not take care of her by himself.  Pt states that she has sore areas in her groin and on the right side of her abdomen.

## 2012-05-24 NOTE — ED Notes (Signed)
Attempted to call report to department.  RN unavailable.  Left message for return call.

## 2012-05-24 NOTE — ED Notes (Signed)
The patient is a poor historian regarding her past medical history.  States that she only had diarrhea 1 time today and reports that Dr. Aura Camps office told her to come to the emergency department.

## 2012-05-25 ENCOUNTER — Inpatient Hospital Stay (HOSPITAL_COMMUNITY): Payer: Medicare Other

## 2012-05-25 DIAGNOSIS — N390 Urinary tract infection, site not specified: Principal | ICD-10-CM

## 2012-05-25 DIAGNOSIS — M549 Dorsalgia, unspecified: Secondary | ICD-10-CM

## 2012-05-25 DIAGNOSIS — R9431 Abnormal electrocardiogram [ECG] [EKG]: Secondary | ICD-10-CM

## 2012-05-25 DIAGNOSIS — R5383 Other fatigue: Secondary | ICD-10-CM

## 2012-05-25 DIAGNOSIS — H109 Unspecified conjunctivitis: Secondary | ICD-10-CM

## 2012-05-25 DIAGNOSIS — R5381 Other malaise: Secondary | ICD-10-CM

## 2012-05-25 LAB — BLOOD GAS, ARTERIAL
Acid-Base Excess: 2.2 mmol/L — ABNORMAL HIGH (ref 0.0–2.0)
Drawn by: 21694
O2 Content: 2 L/min
TCO2: 25 mmol/L (ref 0–100)
pCO2 arterial: 52.6 mmHg — ABNORMAL HIGH (ref 35.0–45.0)
pH, Arterial: 7.338 — ABNORMAL LOW (ref 7.350–7.450)
pO2, Arterial: 103 mmHg — ABNORMAL HIGH (ref 80.0–100.0)

## 2012-05-25 LAB — CBC
HCT: 37.4 % (ref 36.0–46.0)
MCH: 31.2 pg (ref 26.0–34.0)
MCHC: 32.9 g/dL (ref 30.0–36.0)
MCV: 94.9 fL (ref 78.0–100.0)
Platelets: 188 10*3/uL (ref 150–400)
RDW: 13.2 % (ref 11.5–15.5)

## 2012-05-25 LAB — GLUCOSE, CAPILLARY
Glucose-Capillary: 149 mg/dL — ABNORMAL HIGH (ref 70–99)
Glucose-Capillary: 226 mg/dL — ABNORMAL HIGH (ref 70–99)

## 2012-05-25 LAB — BASIC METABOLIC PANEL
BUN: 9 mg/dL (ref 6–23)
CO2: 30 mEq/L (ref 19–32)
Calcium: 8.7 mg/dL (ref 8.4–10.5)
Chloride: 103 mEq/L (ref 96–112)
Creatinine, Ser: 0.79 mg/dL (ref 0.50–1.10)
Glucose, Bld: 131 mg/dL — ABNORMAL HIGH (ref 70–99)

## 2012-05-25 LAB — TSH: TSH: 0.265 u[IU]/mL — ABNORMAL LOW (ref 0.350–4.500)

## 2012-05-25 LAB — HEMOGLOBIN A1C: Mean Plasma Glucose: 171 mg/dL — ABNORMAL HIGH (ref <117)

## 2012-05-25 MED ORDER — INSULIN ASPART 100 UNIT/ML ~~LOC~~ SOLN
0.0000 [IU] | Freq: Three times a day (TID) | SUBCUTANEOUS | Status: DC
Start: 2012-05-26 — End: 2012-05-27
  Administered 2012-05-26: 3 [IU] via SUBCUTANEOUS
  Administered 2012-05-26: 2 [IU] via SUBCUTANEOUS
  Administered 2012-05-27 (×2): 3 [IU] via SUBCUTANEOUS
  Administered 2012-05-27: 2 [IU] via SUBCUTANEOUS

## 2012-05-25 MED ORDER — NALOXONE HCL 0.4 MG/ML IJ SOLN
0.2000 mg | INTRAMUSCULAR | Status: DC | PRN
  Administered 2012-05-25 – 2012-05-26 (×2): 0.2 mg via INTRAVENOUS

## 2012-05-25 MED ORDER — EYE WASH OPHTH SOLN
2.0000 [drp] | OPHTHALMIC | Status: DC
  Administered 2012-05-26 – 2012-05-27 (×2): 2 [drp] via OPHTHALMIC
  Filled 2012-05-25 (×2): qty 118

## 2012-05-25 MED ORDER — CIPROFLOXACIN HCL 0.3 % OP SOLN
2.0000 [drp] | OPHTHALMIC | Status: DC
  Administered 2012-05-25 – 2012-05-27 (×14): 2 [drp] via OPHTHALMIC
  Filled 2012-05-25: qty 2.5

## 2012-05-25 MED ORDER — M.V.I. ADULT IV INJ
INJECTION | INTRAVENOUS | Status: AC
  Filled 2012-05-25: qty 10

## 2012-05-25 MED ORDER — INSULIN ASPART 100 UNIT/ML ~~LOC~~ SOLN: 0.0000 [IU] | Freq: Every day | SUBCUTANEOUS | Status: DC

## 2012-05-25 MED ORDER — THIAMINE HCL 100 MG/ML IJ SOLN
INTRAMUSCULAR | Status: AC
  Filled 2012-05-25: qty 2

## 2012-05-25 MED ORDER — FOLIC ACID 5 MG/ML IJ SOLN
INTRAMUSCULAR | Status: AC
  Filled 2012-05-25: qty 0.2

## 2012-05-25 NOTE — Plan of Care (Signed)
Problem: Consults Goal: General Medical Patient Education See Patient Education Module for specific education.  Booklet given patient unable to educate

## 2012-05-25 NOTE — Clinical Social Work Placement (Signed)
Clinical Social Work Department CLINICAL SOCIAL WORK PLACEMENT NOTE 05/25/2012  Patient:  Alexis Reese, Alexis Reese  Account Number:  [id number] Admit date:  05/24/2012  Clinical Social Worker:  Derenda Fennel, LCSW  Date/time:  05/25/2012 10:15 AM  Clinical Social Work is seeking post-discharge placement for this patient at the following level of care:   SKILLED NURSING   (*CSW will update this form in Epic as items are completed)   05/25/2012  Patient/family provided with [facility] Department of Clinical Social Work's list of facilities offering this level of care within the geographic area requested by the patient (or if unable, by the patient's family).  05/25/2012  Patient/family informed of their freedom to choose among providers that offer the needed level of care, that participate in Medicare, Medicaid or managed care program needed by the patient, have an available bed and are willing to accept the patient.  05/25/2012  Patient/family informed of [facility]' ownership interest in [facility], as well as of the fact that they are under no obligation to receive care at this facility.  PASARR submitted to EDS on 05/25/2012 PASARR number received from EDS on   FL2 transmitted to all facilities in geographic area requested by pt/family on  05/25/2012 FL2 transmitted to all facilities within larger geographic area on   Patient informed that his/her managed care company has contracts with or will negotiate with  certain facilities, including the following:     Patient/family informed of bed offers received:   Patient chooses bed at  Physician recommends and patient chooses bed at    Patient to be transferred to  on   Patient to be transferred to facility by   The following physician request were entered in Epic:   Additional Comments:  Derenda Fennel, LCSW [phone]

## 2012-05-25 NOTE — Progress Notes (Signed)
[facility]                                                                                Patient Demographics  Alexis Reese, is a 78 y.o. female, DOB - [birth year], ZOX:[id number], WJX:[id number]  Admit date - 05/24/2012  Admitting Physician Wilson Singer, MD  Outpatient Primary MD for the patient is Josue Hector, MD  LOS - 1   Chief Complaint  Patient presents with  . Diarrhea        Assessment & Plan     #1 UTI, empiric ceftriaxone await cultures , clinically much better.   #2 conjunctivitis with green discharge from bilateral eyes, topical antibiotic eyedrops and eye wash , improving redness.   #3 depression anxiety, unspecified mental disorder, resumed her home meds but I also question if these are contributing to her generalized fatigue, will refer to her outpatient physician for guidance.     #4 of therapy evaluation for gait instability an assisted device needs and consider SNIF placement for rehabilitation . Requested social worker to also evaluate patient.   #5 her urinary incontinence and now fecal incontinence or a major management issue for her given her immobility, she may need long-term care that her brother and son cannot provide, she has had several falls recently.    #6 Will check a TSH it is pending, she has hypothyroidism continue home dose Synthroid.   #7 check a vitamin D level, she is on several doses of vitamins and vitamin D. she may potentially have vitamin D toxicity.     #8 polypharmacy if she goes home at discharge would recommend a home health nurse to evaluate her home medication and home evaluation for safety, her brother reports several duplicate pill bottles     #9 for her intertriginous fungal infection-seems to be very distressing for her and she states she has tried 100s of different creams prescribed by her primary care physician and it will not clear up, she has been counseled on the fact that if she  continues to wear briefs and they remain wet that it is like a severe diaper rash and it is very difficult to go away without very close attention, also puts her at risk for recurrent urinary tract infections which she is being admitted with-give her a one-time dose of IV fluconazole and start her on a 7 day course of oral fluconazole for this fungal infection we'll also continue to use a topical nystatin and steroid cream. Improving with intervention.      Code Status: Full  Family Communication: pantient  Disposition Plan: TBD   Procedures     Consults      DVT Prophylaxis  Lovenox    Lab Results  Component Value Date   PLT 188 05/25/2012    Medications  Scheduled Meds: . aspirin EC  81 mg Oral Daily  . benazepril  20 mg Oral QHS  . cefTRIAXone (ROCEPHIN)  IV  1 g Intravenous Q24H  . ciprofloxacin  2 drop Both Eyes Q4H while awake  . enoxaparin (LOVENOX) injection  40 mg Subcutaneous Q24H  . eye wash  2 drop Both Eyes BH-[id number]  . fluconazole  100 mg Oral Daily  . fluticasone  2 spray Each Nare Daily  . levothyroxine  100 mcg Oral QPM  . loratadine  10 mg Oral q morning - 10a  . nortriptyline  25 mg Oral QHS  . nystatin-triamcinolone   Topical BID  . PARoxetine  15 mg Oral BID  . QUEtiapine  25 mg Oral BID  . risperiDONE  0.25 mg Oral TID  . senna  1 tablet Oral BID   Continuous Infusions:  PRN Meds:.albuterol, ALPRAZolam, alum & mag hydroxide-simeth, HYDROcodone-acetaminophen, ondansetron (ZOFRAN) IV, ondansetron, sorbitol  Antibiotics     Anti-infectives   Start     Dose/Rate Route Frequency Ordered Stop   05/25/12 1800  cefTRIAXone (ROCEPHIN) 1 g in dextrose 5 % 50 mL IVPB     1 g 100 mL/hr over 30 Minutes Intravenous Every 24 hours 05/24/12 2139     05/25/12 1000  fluconazole (DIFLUCAN) tablet 100 mg     100 mg Oral Daily 05/24/12 2105 06/01/12 0959   05/24/12 2130  fluconazole (DIFLUCAN) IVPB 200 mg     200 mg 100 mL/hr over 60 Minutes Intravenous  Once  05/24/12 2101 05/25/12 0012   05/24/12 2115  fluconazole (DIFLUCAN) tablet 100 mg  Status:  Discontinued     100 mg Oral Daily 05/24/12 2101 05/24/12 2105   05/24/12 1715  cefTRIAXone (ROCEPHIN) 1 g in dextrose 5 % 50 mL IVPB     1 g 100 mL/hr over 30 Minutes Intravenous  Once 05/24/12 1712 05/24/12 1835       Time Spent in minutes   35   Nathalia Wismer K M.D on 05/25/2012 at 9:09 AM  Between 7am to 7pm - Pager - [phone]  After 7pm go to [email or web address] - password [email or web address]  And look for the night coverage person covering for me after hours  [facility] Office  [phone]    Subjective:   Alexis Reese today has, No headache, No chest pain, No abdominal pain - No Nausea, No new weakness tingling or numbness, No Cough - SOB. Generalized weakness.  Objective:   Filed Vitals:   05/24/12 1800 05/24/12 2010 05/24/12 2108 05/25/12 0532  BP: 142/103 116/87 138/95 114/75  Pulse: 86 91 86 81  Temp:  97.5 F (36.4 C) 98 F (36.7 C) 97.6 F (36.4 C)  TempSrc:  Oral    Resp:  18 20 20   Height:   5\' 3"  (1.6 m)   Weight:      SpO2: 94% 96% 96% 92%    Wt Readings from Last 3 Encounters:  05/24/12 79.379 kg (175 lb)     Intake/Output Summary (Last 24 hours) at 05/25/12 0909 Last data filed at 05/25/12 0032  Gross per 24 hour  Intake      0 ml  Output    350 ml  Net   -350 ml    Exam Awake Alert, Oriented X 3, No new F.N deficits, Normal affect [place].AT,PERRAL, mild conjunctivitis with minimal discharge Supple Neck,No JVD, No cervical lymphadenopathy appriciated.  Symmetrical Chest wall movement, Good air movement bilaterally, CTAB RRR,No Gallops,Rubs or new Murmurs, No Parasternal Heave +ve B.Sounds, Abd Soft, Non tender, No organomegaly appriciated, No rebound - guarding or rigidity. No Cyanosis, Clubbing or edema, No new Rash or bruise, except erythematous rash in her bilateral intertriginous areas    Data Review   Micro Results No results found for  this or any previous visit (from the past 240 hour(s)).  Radiology Reports Dg Chest  1 View  05/24/2012  *RADIOLOGY REPORT*  Clinical Data: Weakness, diarrhea  CHEST - 1 VIEW  Comparison: 10/12/2009  Findings: Previous mastectomy and axillary lymph node dissection on the right.  Low lung volumes with an elevated right hemidiaphragm. Stable heart size and vascularity.  Negative for pneumonia, collapse, consolidation, effusion or pneumothorax.  IMPRESSION: Low volume exam and an elevated right hemidiaphragm.  No acute chest process.   Original Report Authenticated By: Judie Petit. Shick, M.D.     [facility]  Recent Labs Lab 05/24/12 1613 05/25/12 0536  WBC 7.5 6.1  HGB 13.9 12.3  HCT 41.4 37.4  PLT 188 188  MCV 93.7 94.9  MCH 31.4 31.2  MCHC 33.6 32.9  RDW 13.0 13.2  LYMPHSABS 0.7  --   MONOABS 0.4  --   EOSABS 0.1  --   BASOSABS 0.0  --     Chemistries   Recent Labs Lab 05/24/12 1613 05/25/12 0536  NA 139 139  K 4.2 4.2  CL 98 103  CO2 33* 30  GLUCOSE 151* 131*  BUN 10 9  CREATININE 0.80 0.79  CALCIUM 9.5 8.7  AST 14  --   ALT 13  --   ALKPHOS 64  --   BILITOT 0.4  --    ------------------------------------------------------------------------------------------------------------------ estimated creatinine clearance is 60.6 ml/min (by C-G formula based on Cr of 0.79). ------------------------------------------------------------------------------------------------------------------ No results found for this basename: HGBA1C,  in the last 72 hours ------------------------------------------------------------------------------------------------------------------ No results found for this basename: CHOL, HDL, LDLCALC, TRIG, CHOLHDL, LDLDIRECT,  in the last 72 hours ------------------------------------------------------------------------------------------------------------------ No results found for this basename: TSH, T4TOTAL, FREET3, T3FREE, THYROIDAB,  in the last 72  hours ------------------------------------------------------------------------------------------------------------------ No results found for this basename: VITAMINB12, FOLATE, FERRITIN, TIBC, IRON, RETICCTPCT,  in the last 72 hours  Coagulation profile No results found for this basename: INR, PROTIME,  in the last 168 hours  No results found for this basename: DDIMER,  in the last 72 hours  Cardiac Enzymes  Recent Labs Lab 05/24/12 2127  TROPONINI <0.30   ------------------------------------------------------------------------------------------------------------------ No components found with this basename: POCBNP,

## 2012-05-25 NOTE — Progress Notes (Signed)
UR Chart Review Completed  

## 2012-05-25 NOTE — H&P (Signed)
[facility] History and Physical  [name]:[id number] DOB: 10-Aug-1934 DOA: 05/24/2012  Referring physician: Marice Potter PCP: Josue Hector, MD  Specialists: none  Chief Complaint: Generalized weakness, Dysuria, diarrhea  HPI: Alexis Reese is a 78 y.o. female a past medical history significant for chronic low back pain secondary to severe spinal stenosis s/p failed lumbar fusion resulting in scoliosis and radiculopathy, depression and anxiety, hypothyroidism, and hypertension who was brought to the emergency department by her brother who is her primary caregiver at home, with generalized weakness, fecal incontinence and inability to walk -she at baseline has a very limited functional status due to immobility and her unspecified mental disorder. In the ED the patient complains of lower abdominal pain, dysuria, a skin infection in her groin that is persistent and painful, chronic constipation with episode of fecal incontinence/loose stools today. She has new generalized weakness and is unable to bear weight without full assist, her brother reports that her mental status is confused. Her brother states that he can not care for her in this condition at home. He also states that she has bags of medication at home and he thinks her mental status and weakness is related to "too many pills". She has a history of recurrent urinary tract infections and requires the use of adult briefs for urination due to immobility. She has a chronic skin rash/redness in her groin and on her buttock.   Admission requested for treatment of AMS, UTI and possible skilled nursing facility placement and mobility evaluation.   Review of Systems: The patient denies any chest pain, nausea, vomiting, shortness of breath,or bloody stools. No fevers chills, or recent sick contacts.   Past Medical History  Diagnosis Date  . Cancer   . Hypertension   . Diabetes mellitus   . GERD (gastroesophageal reflux  disease)    Past Surgical History  Procedure Laterality Date  . Mastectomy    . Cholecystectomy     Social History:  reports that she has never smoked. She has never used smokeless tobacco. She reports that she does not drink alcohol or use illicit drugs. The patient lives at home with her brother and her son who also struggles with mental illness.  No Known Allergies  History reviewed. No pertinent family history. positive family history for psychiatric disease, coronary artery disease and hypertension Prior to Admission medications   Medication Sig Start Date End Date Taking? Authorizing Provider  benazepril (LOTENSIN) 20 MG tablet Take 20 mg by mouth at bedtime.   Yes Historical Provider, MD  calcitonin, salmon, (MIACALCIN/FORTICAL) 200 UNIT/ACT nasal spray Place 1 spray into the nose daily. Alternating sids of  Nose daily   Yes Historical Provider, MD  clonazePAM (KLONOPIN) 0.5 MG tablet Take 0.5 mg by mouth 3 (three) times daily as needed for anxiety.   Yes Historical Provider, MD  clotrimazole-betamethasone (LOTRISONE) cream Apply 1 application topically 2 (two) times daily.   Yes Historical Provider, MD  cyanocobalamin (,VITAMIN B-12,) 1000 MCG/ML injection Inject 1,000 mcg into the skin every 30 (thirty) days.   Yes Historical Provider, MD  fluticasone (FLONASE) 50 MCG/ACT nasal spray Place 2 sprays into the nose at bedtime.   Yes Historical Provider, MD  HYDROcodone-acetaminophen (NORCO/VICODIN) 5-325 MG per tablet Take 0.5-1 tablets by mouth every 6 (six) hours as needed for pain.   Yes Historical Provider, MD  levothyroxine (SYNTHROID, LEVOTHROID) 100 MCG tablet Take 100 mcg by mouth every evening.   Yes Historical Provider, MD  loratadine (CLARITIN)  10 MG tablet Take 10 mg by mouth every morning.   Yes Historical Provider, MD  nortriptyline (PAMELOR) 25 MG capsule Take 25 mg by mouth at bedtime.   Yes Historical Provider, MD  PARoxetine (PAXIL) 30 MG tablet Take 15 mg by mouth 2  (two) times daily. Given in the morning and at bedtime   Yes Historical Provider, MD  Pitavastatin Calcium (LIVALO) 2 MG TABS Take 1 mg by mouth every morning.   Yes Historical Provider, MD  QUEtiapine (SEROQUEL) 25 MG tablet Take 25 mg by mouth 2 (two) times daily. Given in the morning and at bedtime   Yes Historical Provider, MD  risperiDONE (RISPERDAL) 0.25 MG tablet Take 0.25 mg by mouth 3 (three) times daily.   Yes Historical Provider, MD  Vitamin D, Ergocalciferol, (DRISDOL) 50000 UNITS CAPS Take 50,000 Units by mouth every 7 (seven) days. Taken on Thursdays   Yes Historical Provider, MD   Physical Exam: Filed Vitals:   05/24/12 1800 05/24/12 2010 05/24/12 2108 05/25/12 0532  BP: 142/103 116/87 138/95 114/75  Pulse: 86 91 86 81  Temp:  97.5 F (36.4 C) 98 F (36.7 C) 97.6 F (36.4 C)  TempSrc:  Oral    Resp:  18 20 20   Height:   5\' 3"  (1.6 m)   Weight:      SpO2: 94% 96% 96% 92%     General:  Disheveled, obese, chronically ill-appearing 78 year old woman, she is tearful and distressed  Eyes: Normal  ENT: Edentulous, Ryan mucous membranes  Neck: Normal  Cardiovascular: Normal, regular rate and rhythm  Respiratory: Decreased breath sounds, no rhonchi or wheezing  Abdomen: Soft nontender  Skin: Red, weeping erythroderma rash in her bilateral intertriginous areas  Musculoskeletal: Significant thoracolumbar curvature and scoliosis, lower extremity muscle atrophy  Psychiatric: Tearful, dependent personality type affect, anxious  Neurologic: Nonfocal  Labs on Admission:  Basic Metabolic Panel:  Recent Labs Lab 05/24/12 1613  NA 139  K 4.2  CL 98  CO2 33*  GLUCOSE 151*  BUN 10  CREATININE 0.80  CALCIUM 9.5   Liver Function Tests:  Recent Labs Lab 05/24/12 1613  AST 14  ALT 13  ALKPHOS 64  BILITOT 0.4  PROT 7.0  ALBUMIN 3.5    Recent Labs Lab 05/24/12 1613  LIPASE 12   No results found for this basename: AMMONIA,  in the last 168  hours CBC:  Recent Labs Lab 05/24/12 1613  WBC 7.5  NEUTROABS 6.3  HGB 13.9  HCT 41.4  MCV 93.7  PLT 188   Cardiac Enzymes:  Recent Labs Lab 05/24/12 2127  TROPONINI <0.30    BNP (last 3 results) No results found for this basename: PROBNP,  in the last 8760 hours CBG: No results found for this basename: GLUCAP,  in the last 168 hours  Radiological Exams on Admission: Dg Chest 1 View  05/24/2012  *RADIOLOGY REPORT*  Clinical Data: Weakness, diarrhea  CHEST - 1 VIEW  Comparison: 10/12/2009  Findings: Previous mastectomy and axillary lymph node dissection on the right.  Low lung volumes with an elevated right hemidiaphragm. Stable heart size and vascularity.  Negative for pneumonia, collapse, consolidation, effusion or pneumothorax.  IMPRESSION: Low volume exam and an elevated right hemidiaphragm.  No acute chest process.   Original Report Authenticated By: Judie Petit. Miles Costain, M.D.     EKG: Independently reviewed.   Assessment/Plan Active Problems:   UTI (lower urinary tract infection)   Conjunctivitis   Back pain   Generalized weakness  Constipation   Urinary and fecal incontinence   Abnormal EKG   Degenerative arthritis of lumbar spine   Other secondary scoliosis, lumbar region   Polypharmacy   Intertrigo   Depression with anxiety   Urethral injury  Plan: #1 UTI, empiric ceftriaxone await cultures #2 conjunctivitis with green discharge from bilateral eyes, topical antibiotic eyedrops and eye wash #3 depression anxiety, unspecified mental disorder, resumed her home meds but I also question if these are contributing to her generalized fatigue, will refer to her outpatient physician for guidance. #4 of therapy evaluation for gait instability an assisted device needs and consider SNIF placement for rehabilitation #5 her urinary incontinence and now fecal incontinence or a major management issue for her given her immobility, she may need long-term care that her brother and son  cannot provide, she has had several falls recently. #6 Will check a TSH, she has hypothyroidism #7 check a vitamin D level, she is on several doses of vitamins and vitamin D. she may potentially have vitamin D toxicity. #8 polypharmacy if she goes home at discharge would recommend a home health nurse to evaluate her home medication and home evaluation for safety, her brother reports several duplicate pill bottles #9 for her intertriginous fungal infection-seems to be very distressing for her and she states she has tried 100s of different creams prescribed by her primary care physician and it will not clear up, I educated her on the fact that if she continues to wear briefs and they remain wet that it is like a severe diaper rash and it is very difficult to go away without very close attention, also puts her at risk for recurrent urinary tract infections which she is being admitted with-give her a one-time dose of IV fluconazole and start her on a 7 day course of oral fluconazole for this fungal infection we'll also continue to use a topical nystatin and steroid cream.  Disposition: Await physical therapy recommendations for rehabilitation placement, and likely discharge in the next 24 hours with oral antibiotics.  Time spent: 70 minutes  [employer] [facility] Pager [phone]  If 7PM-7AM, please contact night-coverage [email or web address] Password [facility] 05/25/2012, 6:05 AM

## 2012-05-25 NOTE — Clinical Social Work Note (Signed)
Clinical Social Work Department BRIEF PSYCHOSOCIAL ASSESSMENT 05/25/2012  Patient:  Alexis Reese, Alexis Reese     Account Number:  [id number]     Admit date:  05/24/2012  Clinical Social Worker:  Nancie Neas  Date/Time:  05/25/2012 10:15 AM  Referred by:  Physician  Date Referred:  05/25/2012 Referred for  SNF Placement   Other Referral:   Interview type:  Patient Other interview type:   brother- John    PSYCHOSOCIAL DATA Living Status:  FAMILY Admitted from facility:   Level of care:   Primary support name:  John Primary support relationship to patient:  SIBLING Degree of support available:   supportive    CURRENT CONCERNS Current Concerns  Post-Acute Placement   Other Concerns:    SOCIAL WORK ASSESSMENT / PLAN CSW met with pt at bedside. Pt alert, but oriented to self only. CSW spoke with pt's brother Jonny Ruiz who reports pt lives with him. He is primary caregiver and recently has had to provide a lot of pt's care. Pt has had more difficulty ambulating and needs assistance with transfers. She has a walker with a seat that her brother uses to transfer her from room to room. John feels he is unable to continue to provide care for pt in current condition. CSW discussed placement and need for PT evaluation that has been ordered. Pt has been to [facility] in the past. CSW to initiate bed search in anticipation of SNF and will follow up with PT recommendation and bed offers.   Assessment/plan status:  Psychosocial Support/Ongoing Assessment of Needs Other assessment/ plan:   Information/referral to community resources:   SNF list    PATIENT'S/FAMILY'S RESPONSE TO PLAN OF CARE: Pt unable to discuss plan of care. Pt's brother is agreeable to SNF.        Derenda Fennel, [place] [phone]

## 2012-05-25 NOTE — Evaluation (Signed)
Physical Therapy Evaluation Patient Details Name: Alexis Reese MRN: [id number] DOB: 1934-08-26 Today's Date: 05/25/2012 Time: 5621-3086 PT Time Calculation (min): 34 min  PT Assessment / Plan / Recommendation Clinical Impression  Pt with decreased mobility and activity tolerance who will benefit from skilled PT to improve I and safety.  Pt will benefit from SNF prior to D/C to return pt to previous functional abilityl    PT Assessment  Patient needs continued PT services    Follow Up Recommendations  SNF    Does the patient have the potential to tolerate intense rehabilitation    no  Barriers to Discharge Decreased caregiver support      Equipment Recommendations  None recommended by PT    Recommendations for Other Services     Frequency Min 3X/week    Precautions / Restrictions Precautions Precautions: Fall Restrictions Weight Bearing Restrictions: No   Pertinent Vitals/Pain 7/10 B legs; nurse notified      Mobility  Bed Mobility Bed Mobility: Rolling Right;Rolling Left;Left Sidelying to Sit Rolling Right: 3: Mod assist Rolling Left: 3: Mod assist Left Sidelying to Sit: 3: Mod assist Transfers Transfers: Sit to Stand;Stand to Sit;Stand Pivot Transfers Sit to Stand: 3: Mod assist Stand to Sit: 4: Min assist;With upper extremity assist Stand Pivot Transfers: 2: Max assist;With armrests Ambulation/Gait Ambulation Distance (Feet): 0 Feet Assistive device: Rolling walker        PT Diagnosis: Difficulty walking;Generalized weakness  PT Problem List: Decreased strength;Decreased activity tolerance;Decreased balance;Decreased mobility;Obesity;Pain PT Treatment Interventions: Gait training;Therapeutic exercise;Therapeutic activities   PT Goals Acute Rehab PT Goals PT Goal Formulation: With patient Time For Goal Achievement: 05/27/12 Potential to Achieve Goals: Fair Pt will Roll Supine to Left Side: with min assist PT Goal: Rolling Supine to Left Side -  Progress: Goal set today Pt will go Supine/Side to Sit: with min assist PT Goal: Supine/Side to Sit - Progress: Goal set today Pt will go Sit to Stand: with min assist PT Goal: Sit to Stand - Progress: Goal set today Pt will Ambulate: 1 - 15 feet Pt will Perform Home Exercise Program: with min assist PT Goal: Perform Home Exercise Program - Progress: Goal set today  Visit Information  Last PT Received On: 05/25/12 Assistance Needed: +1    Subjective Data  Subjective: Pt states that she does not want to go to a SNF. I tried to explain to the pt that she is becoming weaker and weaker and her 3 yo brother does not have the strength to take care of her any longer.  Explained that the patient is going to have to become stronger so she is able to walk on her own again to go home. Patient Stated Goal: Pt wants to go home   Prior Functioning  Home Living Lives With: Other (Comment) (brother) Available Help at Discharge: Skilled Nursing Facility Type of Home: House Home Access: Stairs to enter [employer] of Steps: 2 Entrance Stairs-Rails: Right Home Layout: One level Bathroom Shower/Tub: Engineer, manufacturing systems: Standard Home Adaptive Equipment: Walker - rolling Prior Function Level of Independence: Needs assistance Needs Assistance: Bathing;Dressing;Feeding;Grooming;Toileting;Gait;Transfers;Meal Prep;Light Housekeeping Bath: Maximal Dressing: Maximal Feeding: Minimal Grooming: Moderate Toileting: Maximal Meal Prep: Maximal Light Housekeeping: Maximal Gait Assistance: Per brother pt has needed greater and greater assistance. Driving: No Vocation: Retired Musician: No difficulties    Copywriter, advertising Overall Cognitive Status: Appears within functional limits for tasks assessed/performed Arousal/Alertness: Awake/alert Orientation Level: Appears intact for tasks assessed Behavior During Session: [facility] for  tasks performed     Extremity/Trunk Assessment Right Lower Extremity Assessment RLE ROM/Strength/Tone: Deficits RLE ROM/Strength/Tone Deficits: strength generally 2/5 Left Lower Extremity Assessment LLE ROM/Strength/Tone: Deficits LLE ROM/Strength/Tone Deficits: strength generally 2/5   Balance    End of Session PT - End of Session Equipment Utilized During Treatment: Gait belt Patient left: in chair;with call bell/phone within reach;with family/visitor present  GP     Alexis Reese,CINDY 05/25/2012, 12:21 PM

## 2012-05-26 ENCOUNTER — Inpatient Hospital Stay (HOSPITAL_COMMUNITY): Payer: Medicare Other

## 2012-05-26 DIAGNOSIS — E119 Type 2 diabetes mellitus without complications: Secondary | ICD-10-CM | POA: Insufficient documentation

## 2012-05-26 DIAGNOSIS — R5381 Other malaise: Secondary | ICD-10-CM

## 2012-05-26 DIAGNOSIS — Z79899 Other long term (current) drug therapy: Secondary | ICD-10-CM

## 2012-05-26 LAB — GLUCOSE, CAPILLARY
Glucose-Capillary: 122 mg/dL — ABNORMAL HIGH (ref 70–99)
Glucose-Capillary: 142 mg/dL — ABNORMAL HIGH (ref 70–99)
Glucose-Capillary: 135 mg/dL — ABNORMAL HIGH (ref 70–99)

## 2012-05-26 LAB — URINE CULTURE: Colony Count: >=100000

## 2012-05-26 LAB — VITAMIN D 25 HYDROXY (VIT D DEFICIENCY, FRACTURES): Vit D, 25-Hydroxy: 43 ng/mL (ref 30–89)

## 2012-05-26 MED ORDER — NORTRIPTYLINE HCL 10 MG PO CAPS
ORAL_CAPSULE | ORAL | Status: AC
  Filled 2012-05-26: qty 1

## 2012-05-26 MED ORDER — CEPHALEXIN 500 MG PO CAPS
500.0000 mg | ORAL_CAPSULE | Freq: Two times a day (BID) | ORAL | Status: DC
  Administered 2012-05-27: 500 mg via ORAL
  Filled 2012-05-26: qty 1

## 2012-05-26 MED ORDER — RISPERIDONE 0.5 MG PO TABS: 0.2500 mg | ORAL_TABLET | Freq: Every day | ORAL | Status: DC

## 2012-05-26 MED ORDER — QUETIAPINE FUMARATE 25 MG PO TABS: 25.0000 mg | ORAL_TABLET | Freq: Every day | ORAL | Status: DC

## 2012-05-26 MED ORDER — NORTRIPTYLINE HCL 10 MG PO CAPS
10.0000 mg | ORAL_CAPSULE | Freq: Every day | ORAL | Status: DC
  Administered 2012-05-26: 10 mg via ORAL
  Filled 2012-05-26 (×4): qty 1

## 2012-05-26 MED ORDER — NALOXONE HCL 0.4 MG/ML IJ SOLN
INTRAMUSCULAR | Status: AC
  Filled 2012-05-26: qty 1

## 2012-05-26 MED ORDER — HYDROCODONE-ACETAMINOPHEN 5-325 MG PO TABS: 1.0000 | ORAL_TABLET | Freq: Four times a day (QID) | ORAL | Status: DC | PRN

## 2012-05-26 MED ORDER — ALPRAZOLAM 0.5 MG PO TABS: 0.5000 mg | ORAL_TABLET | Freq: Every evening | ORAL | Status: DC | PRN

## 2012-05-26 NOTE — Progress Notes (Signed)
RN came into patient's room to give night time medications at 2210. RN tried for 20 minutes to try to get patient awake enough to take them, but patient would only open her eyes for a few seconds and patient would only mumble what she was trying to say. Patient's VS were stable 146/86 and HR 85, temp 98.6, oxygen was a little low at 89% RA. 2L were put on and doctor was called. New orders were given for a chest xray and ABG.

## 2012-05-26 NOTE — Progress Notes (Signed)
Physical Therapy Treatment Patient Details Name: Alexis Reese MRN: [id number] DOB: 12-18-1934 Today's Date: 05/26/2012 Time: 5621-3086 PT Time Calculation (min): 37 min  PT Assessment / Plan / Recommendation Comments on Treatment Session  Pt was tearful throughout the entire tx, upset about going to SNF.  She adamantly refused to try to get OOB and would only agree to do ther ex in the bed.  I tried to explain the benefits that she would get from SNF, but no amount of encouragement helped.    Follow Up Recommendations        Does the patient have the potential to tolerate intense rehabilitation     Barriers to Discharge        Equipment Recommendations       Recommendations for Other Services    Frequency     Plan Discharge plan remains appropriate;Frequency remains appropriate    Precautions / Restrictions Precautions Precautions: Fall   Pertinent Vitals/Pain     Mobility  Bed Mobility Rolling Right: Not tested (comment) Rolling Left: Not tested (comment) Left Sidelying to Sit: Not tested (comment) Transfers Transfers: Not assessed    Exercises General Exercises - Lower Extremity Ankle Circles/Pumps: Strengthening;Both;10 reps;Supine Short Arc Quad: Strengthening;Both;10 reps;Supine Heel Slides: AROM;Both;10 reps;Supine Hip ABduction/ADduction: AROM;Both;10 reps;Supine   PT Diagnosis:    PT Problem List:   PT Treatment Interventions:     PT Goals Acute Rehab PT Goals PT Goal: Rolling Supine to Left Side - Progress: Not progressing PT Goal: Supine/Side to Sit - Progress: Not progressing PT Goal: Sit to Stand - Progress: Not progressing PT Goal: Ambulate - Progress: Not progressing PT Goal: Perform Home Exercise Program - Progress: Progressing toward goal  Visit Information  Last PT Received On: 05/26/12    Subjective Data  Subjective: pt is very tearful about going to SNF   Cognition       Balance     End of Session PT - End of Session Activity  Tolerance: Treatment limited secondary to agitation Patient left: in bed;with call bell/phone within reach;with family/visitor present   GP     Konrad Penta 05/26/2012, 4:15 PM

## 2012-05-26 NOTE — Progress Notes (Signed)
RN called doctor back about stating the results from the ABG and chest xray came back, and doctor ordered Narcan to be given. RN gave 0.2mg  and then waited 5 minutes later to give 0.2mg  more. RN saw no change in patient, and so called doctor back. Doctor came up to the floor to see patient and ordered a CT of her head with contrast. RN will bring her down to CT. Will continue to monitor patient.

## 2012-05-26 NOTE — Progress Notes (Signed)
[facility] PROGRESS NOTE  Alexis Reese WUJ:[id number] DOB: [birth year] DOA: 05/24/2012 PCP: Josue Hector, MD  Assessment/Plan: #1UTI, empiric ceftriaxone day #2. Cultures with gm neg rods >100,00. Pt reports recent frequency.  #2 conjunctivitis with green discharge from bilateral eyes, topical antibiotic eyedrops and eye wash. Improved somewhat this am. Continue antibiotic drops.    #3 depression anxiety, unspecified mental disorder. Pt suddenly obtunded last night. CT head neg. ABG's with CO2 54 O2 103. Narcan given. No events on tele. VS remained stable.  This am on rounds sitting up in bed eating breakfast alert oriented to self and place. Home meds adjusted on admission as some concern polypharmacy as well as Vit D toxicity. Vit D hidroxy WNL, HgA1c 7.6. Vit D dihydroxy pending. Monitor    #4 gait instability: brother reports recent decline. Pt evaluated and recommending SNF. Brother agreeable. SW initiating search.    #5 Recent falls. See #3 and #4.    #6 Hypothyroidism: TSH 0.265. Continue synthroid    #8 polypharmacy if she goes home at discharge would recommend a home health nurse to evaluate her home medication and home evaluation for safety, her brother reports several duplicate pill bottles   #9 fungal infection-related to her recent incontinence/UTI and wearing of briefs. Given IV fluconazole on 2/19. Continue  7 day course of oral fluconazole as well as  topical nystatin and steroid cream.   #10. Diabetes: pt reports diet controlled. hgA1c 7.6. CBG range 116-226. Will adjust diet and continue to monitor.   #11. Anxiety/depression: appears stable at baseline. Pt currently on paxil, pamilor and seroquel.    Code Status: full Family Communication: pt at bedside Disposition Plan: facility when ready likely today or tomorrow   Consultants:  none  Procedures: none Antibiotics:  Rocephin 05/25/12>>>  Fluconazole 05/25/12>>>  HPI/Subjective: Awake  alert eating breakfast. Denies pain/discomfort. Complains frequent urination  Objective: Filed Vitals:   05/25/12 0532 05/25/12 1349 05/25/12 2122 05/26/12 0610  BP: 114/75 132/83 137/75 153/90  Pulse: 81 81 80 88  Temp: 97.6 F (36.4 C) 97.9 F (36.6 C) 98.4 F (36.9 C) 97.8 F (36.6 C)  TempSrc:      Resp: 20 18 20 20   Height:      Weight:      SpO2: 92% 94% 92% 97%    Intake/Output Summary (Last 24 hours) at 05/26/12 0937 Last data filed at 05/26/12 0505  Gross per 24 hour  Intake    480 ml  Output    400 ml  Net     80 ml   Filed Weights   05/24/12 1507  Weight: 79.379 kg (175 lb)    Exam:   General:  Awake alert NAD  Cardiovascular: RRR No MGR No LEE PPP  Respiratory: normal effort BSCTAB No wheeze/rhonci  Abdomen: obese soft +BS non-tender to palpation  Head: eyes with small amount thick drainage. Mild conjunctivitis. Poor dentition. Mucus membranes mouth moist pink  Data Reviewed: Basic Metabolic Panel:  Recent Labs Lab 05/24/12 1613 05/25/12 0536  NA 139 139  K 4.2 4.2  CL 98 103  CO2 33* 30  GLUCOSE 151* 131*  BUN 10 9  CREATININE 0.80 0.79  CALCIUM 9.5 8.7   Liver Function Tests:  Recent Labs Lab 05/24/12 1613  AST 14  ALT 13  ALKPHOS 64  BILITOT 0.4  PROT 7.0  ALBUMIN 3.5    Recent Labs Lab 05/24/12 1613  LIPASE 12   No results found for this basename: AMMONIA,  in the last 168 hours CBC:  Recent Labs Lab 05/24/12 1613 05/25/12 0536  WBC 7.5 6.1  NEUTROABS 6.3  --   HGB 13.9 12.3  HCT 41.4 37.4  MCV 93.7 94.9  PLT 188 188   Cardiac Enzymes:  Recent Labs Lab 05/24/12 2127  TROPONINI <0.30   BNP (last 3 results) No results found for this basename: PROBNP,  in the last 8760 hours CBG:  Recent Labs Lab 05/25/12 1134 05/25/12 1703 05/25/12 2040 05/25/12 2230 05/26/12 0730  GLUCAP 149* 226* 116* 122* 142*    Recent Results (from the past 240 hour(s))  URINE CULTURE     Status: None   Collection  Time    05/24/12  3:40 PM      Result Value Range Status   Specimen Description URINE, CATHETERIZED   Final   Special Requests NONE   Final   Culture  Setup Time 05/25/2012 01:21   Final   Colony Count >=100,000 COLONIES/ML   Final   Culture GRAM NEGATIVE RODS   Final   Report Status PENDING   Incomplete     Studies: Dg Chest 1 View  05/24/2012  *RADIOLOGY REPORT*  Clinical Data: Weakness, diarrhea  CHEST - 1 VIEW  Comparison: 10/12/2009  Findings: Previous mastectomy and axillary lymph node dissection on the right.  Low lung volumes with an elevated right hemidiaphragm. Stable heart size and vascularity.  Negative for pneumonia, collapse, consolidation, effusion or pneumothorax.  IMPRESSION: Low volume exam and an elevated right hemidiaphragm.  No acute chest process.   Original Report Authenticated By: Judie Petit. Miles Costain, M.D.    Ct Head Wo Contrast  05/26/2012  *RADIOLOGY REPORT*  Clinical Data: Lethargic  CT HEAD WITHOUT CONTRAST  Technique:  Contiguous axial images were obtained from the base of the skull through the vertex without contrast.  Comparison: 10/11/2009  Findings: Degraded by unconventional positioning. Prominence of the sulci, cisterns, and ventricles, in keeping with volume loss. There are subcortical and periventricular white matter hypodensities, a nonspecific finding most often seen with chronic microangiopathic changes.  There is no evidence for acute hemorrhage, overt hydrocephalus, mass lesion, or abnormal extra-axial fluid collection.  No definite CT evidence for acute cortical based (large artery) infarction.  The visualized paranasal sinuses and mastoid air cells are predominately clear.  IMPRESSION: Degraded by unconventional positioning.  Allowing for this, volume loss and white matter changes are similar to prior.  No definitive CT evidence for acute intracranial abnormality.   Original Report Authenticated By: Jearld Lesch, M.D.    Dg Chest Port 1 View  05/25/2012   *RADIOLOGY REPORT*  Clinical Data: Lethargic  PORTABLE CHEST - 1 VIEW  Comparison: 05/25/2012  Findings: Prominent cardiomediastinal contours, the increased from yesterday may be accentuated by rotation.  Surgical clips right axilla.  Hypoaerated lungs.  No pneumothorax or confluent airspace opacity.  No pleural effusion or pneumothorax.  Surgical clips right upper quadrant.  No acute osseous finding.  IMPRESSION: Prominent cardiomediastinal contours. Aortic pathology not excluded and could be further evaluated with CTA.  Hypoaerated lungs without focal consolidation.   Original Report Authenticated By: Jearld Lesch, M.D.     Scheduled Meds: . aspirin EC  81 mg Oral Daily  . benazepril  20 mg Oral QHS  . cefTRIAXone (ROCEPHIN)  IV  1 g Intravenous Q24H  . ciprofloxacin  2 drop Both Eyes Q4H while awake  . enoxaparin (LOVENOX) injection  40 mg Subcutaneous Q24H  . eye wash  2 drop Both  Eyes BH-[id number]  . fluconazole  100 mg Oral Daily  . fluticasone  2 spray Each Nare Daily  . insulin aspart  0-15 Units Subcutaneous TID WC  . insulin aspart  0-5 Units Subcutaneous QHS  . levothyroxine  100 mcg Oral QPM  . loratadine  10 mg Oral q morning - 10a  . nortriptyline  25 mg Oral QHS  . nystatin-triamcinolone   Topical BID  . PARoxetine  15 mg Oral BID  . QUEtiapine  25 mg Oral BID  . risperiDONE  0.25 mg Oral TID  . senna  1 tablet Oral BID   Continuous Infusions:   Active Problems:   UTI (lower urinary tract infection)   Conjunctivitis   Back pain   Generalized weakness   Constipation   Urinary and fecal incontinence   Abnormal EKG   Degenerative arthritis of lumbar spine   Other secondary scoliosis, lumbar region   Polypharmacy   Intertrigo   Depression with anxiety   Urethral injury    Time spent: 40 minutes  [facility] M  [facility]  If 8PM-8AM, please contact night-coverage at [email or web address], password [facility] 05/26/2012, 9:37 AM  LOS: 2 days   Attending note: Patient  interviewed and examined independently. Agree with above. Will decrease sedating medications in case this contributed to her episode last night. Currently alert, oriented and appropriate. Will change antibiotics to by mouth.  Crista Curb, MD

## 2012-05-26 NOTE — Progress Notes (Signed)
CSW met with patient and her brother today to give bed offers. Brother wants placement at the [facility].  Patient was very tearful during visit and stated that she wants to go home- not to a SNF. She is able to verbalize understanding that she cannot care for herself at home adequately at this time and her brother stated that he cannot manage her either.  Patient reluctantly agreed to short term SNF.  She has been placed in the past at [facility] but does not wish to return there.  Patient's insurance - [employer] requires pre-authorization.  CSW contacted [facility] for [facility] and referral made. Will fax referral information in the am.  Fl2 is on chart and has been signed by MD.  Possible d/c to SNF tomorrow if medically stable per MD and insurance auth can be obtained.  CSW will monitor.  Lorri Frederick. [place]  [phone]

## 2012-05-26 NOTE — Progress Notes (Signed)
UR Chart Review Completed  

## 2012-05-27 ENCOUNTER — Inpatient Hospital Stay
Admission: RE | Admit: 2012-05-27 | Discharge: 2012-07-02 | Disposition: A | Discharge status: Home / Self Care | Payer: Medicare Other | Source: Ambulatory Visit | Attending: Internal Medicine | Admitting: Internal Medicine

## 2012-05-27 DIAGNOSIS — A498 Other bacterial infections of unspecified site: Secondary | ICD-10-CM

## 2012-05-27 DIAGNOSIS — E669 Obesity, unspecified: Secondary | ICD-10-CM

## 2012-05-27 DIAGNOSIS — E119 Type 2 diabetes mellitus without complications: Secondary | ICD-10-CM | POA: Diagnosis present

## 2012-05-27 DIAGNOSIS — L538 Other specified erythematous conditions: Secondary | ICD-10-CM

## 2012-05-27 MED ORDER — SENNA 8.6 MG PO TABS: 1.0000 | ORAL_TABLET | Freq: Two times a day (BID) | ORAL | Status: DC

## 2012-05-27 MED ORDER — BENAZEPRIL HCL 10 MG PO TABS: 40.0000 mg | ORAL_TABLET | Freq: Every day | ORAL | Status: DC

## 2012-05-27 MED ORDER — CLONAZEPAM 0.5 MG PO TABS: 0.5000 mg | ORAL_TABLET | Freq: Three times a day (TID) | ORAL | Status: DC | PRN

## 2012-05-27 MED ORDER — BENAZEPRIL HCL 20 MG PO TABS: 40.0000 mg | ORAL_TABLET | Freq: Every day | ORAL | Status: DC

## 2012-05-27 MED ORDER — NORTRIPTYLINE HCL 10 MG PO CAPS: 10.0000 mg | ORAL_CAPSULE | Freq: Every day | ORAL | Status: DC

## 2012-05-27 MED ORDER — QUETIAPINE FUMARATE 25 MG PO TABS: 25.0000 mg | ORAL_TABLET | Freq: Every day | ORAL | Status: DC

## 2012-05-27 MED ORDER — MAGNESIUM HYDROXIDE 400 MG/5ML PO SUSP
15.0000 mL | Freq: Once | ORAL | Status: AC
  Administered 2012-05-27: 15 mL via ORAL
  Filled 2012-05-27: qty 30

## 2012-05-27 MED ORDER — HYDROCODONE-ACETAMINOPHEN 5-325 MG PO TABS
0.5000 | ORAL_TABLET | Freq: Four times a day (QID) | ORAL | Status: DC | PRN
Start: 2012-05-27 — End: 2012-06-28

## 2012-05-27 MED ORDER — CEPHALEXIN 500 MG PO CAPS: 500.0000 mg | ORAL_CAPSULE | Freq: Two times a day (BID) | ORAL | Status: DC

## 2012-05-27 MED ORDER — NYSTATIN-TRIAMCINOLONE 100000-0.1 UNIT/GM-% EX CREA: TOPICAL_CREAM | Freq: Two times a day (BID) | CUTANEOUS | Status: DC

## 2012-05-27 MED ORDER — RISPERIDONE 0.25 MG PO TABS: 0.2500 mg | ORAL_TABLET | Freq: Every day | ORAL | Status: DC

## 2012-05-27 NOTE — Progress Notes (Signed)
Physical Therapy Treatment Patient Details Name: ODESTER NILSON MRN: [id number] DOB: [birth year] Today's Date: 05/27/2012 Time: 7425-9563 PT Time Calculation (min): 33 min  PT Assessment / Plan / Recommendation Comments on Treatment Session  Pt was very tearful initially upon my arrival, c/o constipation and general malaise.  She did agree to work with me, however and was able to transfer OOB with mod assist.   She ambulated 8' with a walker, very flexed posture.  We initiated ther ex in the chair, but had to stop as she felt the urge to have a BM.  She was instructed in chair to [facility] transfer.  Pt needs a lot of encouragement and praise and she does respond to this.  SNF will provide and excellent environment to help this pt achieve max functional independence.    Follow Up Recommendations        Does the patient have the potential to tolerate intense rehabilitation     Barriers to Discharge        Equipment Recommendations       Recommendations for Other Services    Frequency     Plan Discharge plan remains appropriate;Frequency remains appropriate    Precautions / Restrictions Restrictions Weight Bearing Restrictions: No   Pertinent Vitals/Pain     Mobility  Bed Mobility Rolling Left: 6: Modified independent (Device/Increase time) Left Sidelying to Sit: 4: Min assist Transfers Sit to Stand: 3: Mod assist;With upper extremity assist;From bed Stand to Sit: 4: Min assist;To chair/3-in-1;With upper extremity assist Stand Pivot Transfers: 4: Min assist Ambulation/Gait Ambulation/Gait Assistance: 4: Min assist Ambulation Distance (Feet): 8 Feet Assistive device: Rolling walker Gait Pattern: Trunk flexed;Shuffle Gait velocity: slow and labored General Gait Details: pt had back surgery in 1997 and has ambulated with trunk flexion since , per brother's report Stairs: No Wheelchair Mobility Wheelchair Mobility: No    Exercises General Exercises - Upper Extremity Shoulder  Flexion: AROM;Both;5 reps;Seated General Exercises - Lower Extremity Long Arc Quad: AROM;Both;10 reps;Seated Hip Flexion/Marching: AROM;Both;10 reps;Seated   PT Diagnosis:    PT Problem List:   PT Treatment Interventions:     PT Goals Acute Rehab PT Goals PT Goal: Rolling Supine to Left Side - Progress: Met PT Goal: Supine/Side to Sit - Progress: Progressing toward goal PT Goal: Sit to Stand - Progress: Progressing toward goal PT Goal: Ambulate - Progress: Progressing toward goal PT Goal: Perform Home Exercise Program - Progress: Progressing toward goal  Visit Information  Last PT Received On: 05/27/12    Subjective Data  Subjective: pt tearful, c/o not feeling well, being constipated   Cognition       Balance  Balance Balance Assessed: Yes Static Sitting Balance Static Sitting - Balance Support: No upper extremity supported;Feet supported Static Sitting - Level of Assistance: 7: Independent Dynamic Sitting Balance Dynamic Sitting - Balance Support: No upper extremity supported;Feet supported Dynamic Sitting - Level of Assistance: 7: Independent Static Standing Balance Static Standing - Balance Support: Bilateral upper extremity supported;During functional activity Static Standing - Level of Assistance: 5: Stand by assistance  End of Session PT - End of Session Equipment Utilized During Treatment: Gait belt Activity Tolerance: Patient tolerated treatment well Patient left: in chair;with family/visitor present Nurse Communication: Mobility status   GP     Myrlene Broker L 05/27/2012, 11:18 AM

## 2012-05-27 NOTE — Clinical Social Work Note (Signed)
Call from patient insurance, wanted updated PT notes from today indicating current level of participation in PT.  Notes faxed, B Johnson from [employer] called.  CSW awaiting decision from insurance company on SNF authorization.  Santa Genera, LCSW Clinical Social Worker [phone])

## 2012-05-27 NOTE — Care Management Note (Unsigned)
    Page 1 of 1   05/27/2012     1:46:03 PM   CARE MANAGEMENT NOTE 05/27/2012  Patient:  Alexis Reese, Alexis Reese   Account Number:  [id number]  Date Initiated:  05/27/2012  Documentation initiated by:  Rosemary Holms  Subjective/Objective Assessment:   Pt admitted with UTI. PT has recommended SNF for pt's discharge disposition. Pt is now participating with PT and now awaiting authorization from Ins.     Action/Plan:   Anticipated DC Date:  05/27/2012   Anticipated DC Plan:  SKILLED NURSING FACILITY  In-house referral  Clinical Social Worker      DC Planning Services  CM consult      Choice offered to / List presented to:             Status of service:  In process, will continue to follow Medicare Important Message given?   (If response is "NO", the following Medicare IM given date fields will be blank) Date Medicare IM given:   Date Additional Medicare IM given:    Discharge Disposition:    Per UR Regulation:    If discussed at Long Length of Stay Meetings, dates discussed:    Comments:  05/27/12 Rosemary Holms RN BSN CM Called brother and left message that pt was not participating with PT and arrangements need to be made for DC home. Asked pt to call. Pt now participating with PT and awaiting authorization for SNF placement. have not heard back from brother.

## 2012-05-27 NOTE — Discharge Summary (Addendum)
Physician Discharge Summary  Patient ID: Alexis Reese MRN: [id number] DOB/AGE: 09/17/1934 78 y.o.  Admit date: 05/24/2012 Discharge date: 05/27/2012  Discharge Diagnoses:  Active Problems:   E. coli UTI   Generalized weakness/ deconditioning   DM type 2 (diabetes mellitus, type 2), diet controlled   Conjunctivitis   Constipation   Degenerative arthritis of lumbar spine   Polypharmacy   Intertrigo   Depression with anxiety   Obesity, unspecified   diet should be diabetic, low-salt     Medication List    TAKE these medications       benazepril 20 MG tablet  Commonly known as:  LOTENSIN  Take 2 tablets (40 mg total) by mouth at bedtime.     calcitonin (salmon) 200 UNIT/ACT nasal spray  Commonly known as:  MIACALCIN/FORTICAL  Place 1 spray into the nose daily. Alternating sids of  Nose daily     cephALEXin 500 MG capsule  Commonly known as:  KEFLEX  Take 1 capsule (500 mg total) by mouth every 12 (twelve) hours. Until 2/25, then stop     clonazePAM 0.5 MG tablet  Commonly known as:  KLONOPIN  Take 1 tablet (0.5 mg total) by mouth 3 (three) times daily as needed for anxiety.     clotrimazole-betamethasone cream  Commonly known as:  LOTRISONE  Apply 1 application topically 2 (two) times daily.     cyanocobalamin 1000 MCG/ML injection  Commonly known as:  (VITAMIN B-12)  Inject 1,000 mcg into the skin every 30 (thirty) days.     fluticasone 50 MCG/ACT nasal spray  Commonly known as:  FLONASE  Place 2 sprays into the nose at bedtime.     HYDROcodone-acetaminophen 5-325 MG per tablet  Commonly known as:  NORCO/VICODIN  Take 0.5-1 tablets by mouth every 6 (six) hours as needed for pain.     levothyroxine 100 MCG tablet  Commonly known as:  SYNTHROID, LEVOTHROID  Take 100 mcg by mouth every evening.     LIVALO 2 MG Tabs  Generic drug:  Pitavastatin Calcium  Take 1 mg by mouth every morning.     loratadine 10 MG tablet  Commonly known as:  CLARITIN  Take 10 mg  by mouth every morning.     nortriptyline 10 MG capsule  Commonly known as:  PAMELOR  Take 1 capsule (10 mg total) by mouth at bedtime.     nystatin-triamcinolone cream  Commonly known as:  MYCOLOG II  Apply topically 2 (two) times daily. For 4 days, then prn     PARoxetine 30 MG tablet  Commonly known as:  PAXIL  Take 15 mg by mouth 2 (two) times daily. Given in the morning and at bedtime     QUEtiapine 25 MG tablet  Commonly known as:  SEROQUEL  Take 1 tablet (25 mg total) by mouth at bedtime. Given in the morning and at bedtime     risperiDONE 0.25 MG tablet  Commonly known as:  RISPERDAL  Take 1 tablet (0.25 mg total) by mouth at bedtime.     senna 8.6 MG Tabs  Commonly known as:  SENOKOT  Take 1 tablet (8.6 mg total) by mouth 2 (two) times daily.     Vitamin D (Ergocalciferol) 50000 UNITS Caps  Commonly known as:  DRISDOL  Take 50,000 Units by mouth every 7 (seven) days. Taken on Thursdays              Follow-up Information   Follow up with Josue Hector, MD  In 4 weeks.   Contact information:   [address] [facility] [address] [phone]       Disposition:  SNF  Discharged Condition: stable  Consults:  PT  Labs:    Sample type          ARTERIAL   Sample type   Delivery systems          NASAL CANNULA   Delivery systems   O2 Content          2.0   O2 Content   pH, Arterial          7.338   pH, Arterial   pCO2 arterial          52.6   pCO2 arterial   pO2, Arterial          103.0   pO2, Arterial   Bicarbonate          27.5   Bicarbonate   TCO2          25.0   TCO2   Acid-Base Excess          2.2   Acid-Base Excess   O2 Saturation          98.8   O2 Saturation   Patient temperature          37.0   Patient temperature   Collection site          LEFT RADIAL   Collection site   Allens test (pass/fail)          PASS   Allens test (pass/fail)    CHEM PROFILE     Sodium         139    Sodium   Potassium         4.2    Potassium   Chloride          103    Chloride   CO2         30    CO2   Mean Plasma Glucose         171    Mean Plasma Glucose   BUN         9    BUN   Creatinine, Ser         0.79    Creatinine, Ser   Calcium         8.7    Calcium   GFR calc non Af Amer         79    GFR calc non Af Amer   GFR calc Af Amer         90 mL/min  The eGFR has been calculated using the CKD EPI equation. This calculation has not been validated in all clinical situations. eGFR's persistently <90 mL/min signify possible Chronic Kidney Disease.">9090 mL/min  The eGFR has been calculated using the CKD EPI equation. This calculation has not been validated in all clinical situations. eGFR's persistently <90 mL/min signify possible Chronic Kidney Disease." border=0 src="file:///C:/PROGRAM%20FILES%20(X86)/EPIC/V7.9/EN-US/Images/IP_COMMENT_EXIST.gif" width=5 height=10    GFR calc Af Amer   Glucose, Bld         131    Glucose, Bld   Alkaline Phosphatase         64    Alkaline Phosphatase   Albumin         3.5    Albumin   Lipase         12    Lipase   AST  14    AST   ALT         13    ALT   Total Protein         7.0    Total Protein   Total Bilirubin         0.4    Total Bilirubin    CARDIAC PROFILE     Troponin I         <0.30     Troponin I    OTHER CHEM     Vit D, 25-Hydroxy         43     Vit D, 25-Hydroxy    CBC     WBC         6.1    WBC   RBC         3.94    RBC   Hemoglobin         12.3    Hemoglobin   HCT         37.4    HCT   MCV         94.9    MCV   MCH         31.2    MCH   MCHC         32.9    MCHC   RDW         13.2    RDW   Platelets         188    Platelets    DIFFERENTIAL     Neutrophils Relative         84    Neutrophils Relative   Lymphocytes Relative         10    Lymphocytes Relative   Monocytes Relative         5    Monocytes Relative   Eosinophils Relative         1    Eosinophils Relative   Basophils Relative         0    Basophils Relative   Neutro Abs         6.3    Neutro Abs   Lymphs  Abs         0.7    Lymphs Abs   Monocytes Absolute         0.4    Monocytes Absolute   Eosinophils Absolute         0.1    Eosinophils Absolute   Basophils Absolute         0.0    Basophils Absolute    DIABETES     Hemoglobin A1C         =6.5% Diagnostic of Diabetes Mellitus (if abnormal result is confirmed) 5.7-6.4% Increased risk of developing Diabetes Mellitus References:Diagnosis and Classification of Diabetes Mellitus,Diabetes Care,2011,34(Suppl 1):S62-S69 and Standards of Medical Care in ..."7.6 =6.5% Diagnostic of Diabetes Mellitus (if abnormal result is confirmed) 5.7-6.4% Increased risk of developing Diabetes Mellitus References:Diagnosis and Classification of Diabetes Mellitus,Diabetes Care,2011,34(Suppl 1):S62-S69 and Standards of Medical Care in ..." border=0 src="file:///C:/PROGRAM%20FILES%20(X86)/EPIC/V7.9/EN-US/Images/IP_COMMENT_EXIST.gif" width=5 height=10    Hemoglobin A1C   Glucose, Bld         131    Glucose, Bld    THYROID     TSH         0.265    TSH    URINALYSIS     Color, Urine         YELLOW  Color, Urine   APPearance         CLOUDY    APPearance   Specific Gravity, Urine         1.015    Specific Gravity, Urine   pH         8.0    pH   Glucose, UA         NEGATIVE    Glucose, UA   Bilirubin Urine         NEGATIVE    Bilirubin Urine   Ketones, ur         NEGATIVE    Ketones, ur   Protein, ur         TRACE    Protein, ur   Urobilinogen, UA         0.2    Urobilinogen, UA   Nitrite         NEGATIVE    Nitrite   Leukocytes, UA         SMALL    Leukocytes, UA   Hgb urine dipstick         MODERATE    Hgb urine dipstick   WBC, UA         21-50    WBC, UA   RBC / HPF         3-6    RBC / HPF   Bacteria, UA         MANY    Bacteria, UA    MICROBIOLOGY     Organism ID, Bacteria         ESCHERICHIA COLI   Diagnostics:  Dg Chest 1 View  05/24/2012  *RADIOLOGY REPORT*  Clinical Data: Weakness, diarrhea  CHEST - 1 VIEW  Comparison: 10/12/2009  Findings: Previous  mastectomy and axillary lymph node dissection on the right.  Low lung volumes with an elevated right hemidiaphragm. Stable heart size and vascularity.  Negative for pneumonia, collapse, consolidation, effusion or pneumothorax.  IMPRESSION: Low volume exam and an elevated right hemidiaphragm.  No acute chest process.   Original Report Authenticated By: Judie Petit. Miles Costain, M.D.    Ct Head Wo Contrast  05/26/2012  *RADIOLOGY REPORT*  Clinical Data: Lethargic  CT HEAD WITHOUT CONTRAST  Technique:  Contiguous axial images were obtained from the base of the skull through the vertex without contrast.  Comparison: 10/11/2009  Findings: Degraded by unconventional positioning. Prominence of the sulci, cisterns, and ventricles, in keeping with volume loss. There are subcortical and periventricular white matter hypodensities, a nonspecific finding most often seen with chronic microangiopathic changes.  There is no evidence for acute hemorrhage, overt hydrocephalus, mass lesion, or abnormal extra-axial fluid collection.  No definite CT evidence for acute cortical based (large artery) infarction.  The visualized paranasal sinuses and mastoid air cells are predominately clear.  IMPRESSION: Degraded by unconventional positioning.  Allowing for this, volume loss and white matter changes are similar to prior.  No definitive CT evidence for acute intracranial abnormality.   Original Report Authenticated By: Jearld Lesch, M.D.    Dg Chest Port 1 View  05/25/2012  *RADIOLOGY REPORT*  Clinical Data: Lethargic  PORTABLE CHEST - 1 VIEW  Comparison: 05/25/2012  Findings: Prominent cardiomediastinal contours, the increased from yesterday may be accentuated by rotation.  Surgical clips right axilla.  Hypoaerated lungs.  No pneumothorax or confluent airspace opacity.  No pleural effusion or pneumothorax.  Surgical clips right upper quadrant.  No acute osseous finding.  IMPRESSION: Prominent cardiomediastinal contours. Aortic pathology not  excluded and could be further evaluated with CTA.  Hypoaerated lungs without focal consolidation.   Original Report Authenticated By: Jearld Lesch, M.D.    EKG: Normal sinus rhythm Inferior infarct , age undetermined  Full Code   Hospital Course: See H&P for complete admission details. The patient is a 78 year old white female who presented with weakness, dysuria and loose stools after laxatives. Her brother is her primary caregiver and felt that she was too weak for him to care for at home. He also had complaints of a skin rash in her perineal area. She had normal vital signs on admission. She was noted to be obese with intertrigo in the groin area. She was tearful and anxious. Workup was significant for urinary tract infection. She was admitted and started on Rocephin. She also had evidence of conjunctivitis and was given eyedrops. She was started on Diflucan and topical therapy for her intertrigo. Physical therapy was consulted and recommended skilled nursing facility. She feels stronger and has been working with physical therapy but would benefit from short-term skilled nursing facility. She has been transitioned to oral antibiotics. The Escherichia coli was resistant to fluoroquinolones but sensitive to cephalosporins. She did have an episode of unresponsiveness which was likely secondary to medications. Many of her sedating medications have been decreased. These may need to be adjusted further. Also, her blood pressure was high and her ACE inhibitor was increased from 20 mg to 40 mg. Total time on the day of discharge greater than 30 minutes.  Discharge Exam:  Blood pressure 156/114, pulse 119, temperature 97.3 F (36.3 C), temperature source Oral, resp. rate 20, height 5\' 3"  (1.6 m), weight 79.379 kg (175 lb), SpO2 100.00%.  General: Comfortable. Bright. Working with physical therapy. Exam otherwise unchanged from 2/20  Signed: Sonia Bromell L 05/27/2012, 11:13 AM

## 2012-05-27 NOTE — Clinical Social Work Placement (Signed)
    Clinical Social Work Department CLINICAL SOCIAL WORK PLACEMENT NOTE 05/27/2012  Patient:  Alexis Reese, Alexis Reese  Account Number:  [id number] Admit date:  05/24/2012  Clinical Social Worker:  Sherrlyn Hock  Date/time:  05/25/2012 10:15 AM  Clinical Social Work is seeking post-discharge placement for this patient at the following level of care:   SKILLED NURSING   (*CSW will update this form in Epic as items are completed)   05/25/2012  Patient/family provided with [facility] Department of Clinical Social Work's list of facilities offering this level of care within the geographic area requested by the patient (or if unable, by the patient's family).  05/25/2012  Patient/family informed of their freedom to choose among providers that offer the needed level of care, that participate in Medicare, Medicaid or managed care program needed by the patient, have an available bed and are willing to accept the patient.  05/25/2012  Patient/family informed of [facility]' ownership interest in [facility], as well as of the fact that they are under no obligation to receive care at this facility.  PASARR submitted to [employer] on 05/25/2012 PASARR number received from [employer] on   FL2 transmitted to all facilities in geographic area requested by pt/family on  05/25/2012 FL2 transmitted to all facilities within larger geographic area on   Patient informed that his/her managed care company has contracts with or will negotiate with  certain facilities, including the following:     Patient/family informed of bed offers received:  05/27/2012 Patient chooses bed at [facility] Physician recommends and patient chooses bed at    Patient to be transferred to [facility] on  05/27/2012 Patient to be transferred to facility by [facility] staff via tunnel  The following physician request were entered in Epic:   Additional Comments: [facility] received insurance authorization,  informed that patient has E PASARR and knows they need to submit new screen within 30 days.  Santa Genera, LCSW Clinical Social Worker [phone])

## 2012-05-27 NOTE — Clinical Social Work Note (Signed)
Patient ready for discharge today to [facility].  [facility] received insurance authorization and is agreeable to receiving patient today, family agreeable to placement at [facility] and asked to go to facility to sign preadmission paperwork.  Patient tearful about prospect of SNF placement, but willing to do.  Discharge summary faxed to facility via [facility], FL2 reviewed w RN and updated.  Discharge packet prepared and placed in shadow chart for transport.    Santa Genera, LCSW Clinical Social Worker [phone])

## 2012-05-27 NOTE — Progress Notes (Signed)
Patient report called to [facility]. Patient discharged to [facility] in stable condition.

## 2012-05-30 LAB — GLUCOSE, CAPILLARY
Glucose-Capillary: 142 mg/dL — ABNORMAL HIGH (ref 70–99)
Glucose-Capillary: 204 mg/dL — ABNORMAL HIGH (ref 70–99)
Glucose-Capillary: 226 mg/dL — ABNORMAL HIGH (ref 70–99)
Glucose-Capillary: 135 mg/dL — ABNORMAL HIGH (ref 70–99)
Glucose-Capillary: 148 mg/dL — ABNORMAL HIGH (ref 70–99)
Glucose-Capillary: 147 mg/dL — ABNORMAL HIGH (ref 70–99)
Glucose-Capillary: 151 mg/dL — ABNORMAL HIGH (ref 70–99)

## 2012-05-31 LAB — GLUCOSE, CAPILLARY
Glucose-Capillary: 161 mg/dL — ABNORMAL HIGH (ref 70–99)
Glucose-Capillary: 177 mg/dL — ABNORMAL HIGH (ref 70–99)

## 2012-05-31 LAB — VITAMIN D 1,25 DIHYDROXY
Vitamin D 1, 25 (OH)2 Total: 25 pg/mL (ref 18–72)
Vitamin D2 1, 25 (OH)2: 25 pg/mL
Vitamin D3 1, 25 (OH)2: <8 pg/mL

## 2012-06-01 LAB — GLUCOSE, CAPILLARY
Glucose-Capillary: 173 mg/dL — ABNORMAL HIGH (ref 70–99)
Glucose-Capillary: 138 mg/dL — ABNORMAL HIGH (ref 70–99)

## 2012-06-02 LAB — GLUCOSE, CAPILLARY: Glucose-Capillary: 306 mg/dL — ABNORMAL HIGH (ref 70–99)

## 2012-06-03 LAB — GLUCOSE, CAPILLARY: Glucose-Capillary: 206 mg/dL — ABNORMAL HIGH (ref 70–99)

## 2012-06-04 LAB — GLUCOSE, CAPILLARY
Glucose-Capillary: 159 mg/dL — ABNORMAL HIGH (ref 70–99)
Glucose-Capillary: 249 mg/dL — ABNORMAL HIGH (ref 70–99)

## 2012-06-05 LAB — GLUCOSE, CAPILLARY: Glucose-Capillary: 139 mg/dL — ABNORMAL HIGH (ref 70–99)

## 2012-06-07 LAB — GLUCOSE, CAPILLARY: Glucose-Capillary: 274 mg/dL — ABNORMAL HIGH (ref 70–99)

## 2012-06-08 LAB — GLUCOSE, CAPILLARY
Glucose-Capillary: 276 mg/dL — ABNORMAL HIGH (ref 70–99)
Glucose-Capillary: 147 mg/dL — ABNORMAL HIGH (ref 70–99)

## 2012-06-10 LAB — GLUCOSE, CAPILLARY: Glucose-Capillary: 241 mg/dL — ABNORMAL HIGH (ref 70–99)

## 2012-06-11 LAB — GLUCOSE, CAPILLARY: Glucose-Capillary: 148 mg/dL — ABNORMAL HIGH (ref 70–99)

## 2012-06-13 LAB — GLUCOSE, CAPILLARY
Glucose-Capillary: 143 mg/dL — ABNORMAL HIGH (ref 70–99)
Glucose-Capillary: 156 mg/dL — ABNORMAL HIGH (ref 70–99)
Glucose-Capillary: 263 mg/dL — ABNORMAL HIGH (ref 70–99)

## 2012-06-14 LAB — GLUCOSE, CAPILLARY: Glucose-Capillary: 156 mg/dL — ABNORMAL HIGH (ref 70–99)

## 2012-06-15 LAB — GLUCOSE, CAPILLARY
Glucose-Capillary: 143 mg/dL — ABNORMAL HIGH (ref 70–99)
Glucose-Capillary: 192 mg/dL — ABNORMAL HIGH (ref 70–99)

## 2012-06-16 LAB — GLUCOSE, CAPILLARY: Glucose-Capillary: 153 mg/dL — ABNORMAL HIGH (ref 70–99)

## 2012-06-17 LAB — GLUCOSE, CAPILLARY: Glucose-Capillary: 122 mg/dL — ABNORMAL HIGH (ref 70–99)

## 2012-06-18 LAB — GLUCOSE, CAPILLARY

## 2012-06-19 LAB — GLUCOSE, CAPILLARY: Glucose-Capillary: 172 mg/dL — ABNORMAL HIGH (ref 70–99)

## 2012-06-20 LAB — GLUCOSE, CAPILLARY
Glucose-Capillary: 154 mg/dL — ABNORMAL HIGH (ref 70–99)
Glucose-Capillary: 164 mg/dL — ABNORMAL HIGH (ref 70–99)

## 2012-06-22 LAB — GLUCOSE, CAPILLARY: Glucose-Capillary: 134 mg/dL — ABNORMAL HIGH (ref 70–99)

## 2012-06-23 LAB — GLUCOSE, CAPILLARY: Glucose-Capillary: 160 mg/dL — ABNORMAL HIGH (ref 70–99)

## 2012-06-25 LAB — GLUCOSE, CAPILLARY: Glucose-Capillary: 212 mg/dL — ABNORMAL HIGH (ref 70–99)

## 2012-06-26 LAB — GLUCOSE, CAPILLARY
Glucose-Capillary: 286 mg/dL — ABNORMAL HIGH (ref 70–99)
Glucose-Capillary: 146 mg/dL — ABNORMAL HIGH (ref 70–99)

## 2012-06-28 ENCOUNTER — Other Ambulatory Visit: Payer: Self-pay | Admitting: *Deleted

## 2012-06-28 LAB — GLUCOSE, CAPILLARY: Glucose-Capillary: 159 mg/dL — ABNORMAL HIGH (ref 70–99)

## 2012-06-28 MED ORDER — HYDROCODONE-ACETAMINOPHEN 5-325 MG PO TABS: ORAL_TABLET | ORAL | Status: DC

## 2012-06-30 LAB — GLUCOSE, CAPILLARY: Glucose-Capillary: 139 mg/dL — ABNORMAL HIGH (ref 70–99)

## 2012-07-01 ENCOUNTER — Non-Acute Institutional Stay (SKILLED_NURSING_FACILITY): Payer: Medicare Other | Admitting: Internal Medicine

## 2012-07-01 DIAGNOSIS — N390 Urinary tract infection, site not specified: Secondary | ICD-10-CM

## 2012-07-01 DIAGNOSIS — F418 Other specified anxiety disorders: Secondary | ICD-10-CM

## 2012-07-01 DIAGNOSIS — M47817 Spondylosis without myelopathy or radiculopathy, lumbosacral region: Secondary | ICD-10-CM

## 2012-07-01 DIAGNOSIS — M549 Dorsalgia, unspecified: Secondary | ICD-10-CM

## 2012-07-01 DIAGNOSIS — R531 Weakness: Secondary | ICD-10-CM

## 2012-07-01 DIAGNOSIS — F341 Dysthymic disorder: Secondary | ICD-10-CM

## 2012-07-01 DIAGNOSIS — E119 Type 2 diabetes mellitus without complications: Secondary | ICD-10-CM

## 2012-07-01 DIAGNOSIS — R5383 Other fatigue: Secondary | ICD-10-CM

## 2012-07-01 DIAGNOSIS — M47816 Spondylosis without myelopathy or radiculopathy, lumbar region: Secondary | ICD-10-CM

## 2012-07-01 DIAGNOSIS — I10 Essential (primary) hypertension: Secondary | ICD-10-CM

## 2012-07-01 DIAGNOSIS — A498 Other bacterial infections of unspecified site: Secondary | ICD-10-CM

## 2012-07-01 LAB — GLUCOSE, CAPILLARY: Glucose-Capillary: 131 mg/dL — ABNORMAL HIGH (ref 70–99)

## 2012-07-02 ENCOUNTER — Encounter: Payer: Self-pay | Admitting: Internal Medicine

## 2012-07-02 DIAGNOSIS — I10 Essential (primary) hypertension: Secondary | ICD-10-CM | POA: Insufficient documentation

## 2012-07-02 LAB — GLUCOSE, CAPILLARY: Glucose-Capillary: 150 mg/dL — ABNORMAL HIGH (ref 70–99)

## 2012-07-02 NOTE — Progress Notes (Signed)
Patient ID: Alexis Reese, female   DOB: [birth year], 78 y.o.   MRN: [id number] Chief complaint-discharge note.  History of present illness.  Patient is a pleasant elderly resident who is here secondary to apparently weakness-.  She lives with her brother and son.  Apparently she was brought to the emergency room by her brother with generalized weakness.  Also apparently had significant depression and anxiety.  Her brother felt he could not take care of her at that time.  She apparently had a short hospitalization.  And was discharged here for rehabilitation.  she has gained strength and is able to transfer.apparently  Her other issues appear to be relatively stable--she does have significant anxiety this appears to be controlled on clonazepam.  She also continues on Seroquel.  In regards to depression she does continue on Cymbalta.  Patient also has a history of diabetes type 2-her blood sugars appear well controlled largely in the mid 100s occasional lower 200s later in the day.  She also has a listed history of hypertension she is on them as a preop exam blood pressure is 109/70-122/79 this range.  She also has a history of hypothyroidism she's on Synthroid we will recheck this before discharge.  Currently she has no acute complaints she she really wants to go home--her brother was in the room and he is quite supportive of this-.  Family medical social history reviewed per history and physical 05/30/2010.  Medications have been reviewed.  Review of systems.  Gen. she denies fever or chills.  Head ears eyes nose mouth and throat-denies visual changes nasal discharge or sore throat.  Respiratory denies shortness of breath or cough.  Cardiac-denies chest pain.  GI-complaining of any abdominal pain nausea or vomiting or constipation.  GU-apparently has some history of urinary incontinence denies any dysuria.  Muscle skeletal-did not really complain of back pain or  joint pain today she is receiving Vicodin for pain relief.  Neurologic-denies any dizziness or headache.  Psych-as stated in history of present illness significant history of depression anxiety-this appears to be relatively well controlled although she is in somewhat of a persistent anxious state.  Physical exam.  Temperature is 97.4 pulse95 respirations 20 blood pressure 109/70.  In general this is a pleasant elderly female in no distress sitting comfortably in her wheelchair.  Her skin is warm and dry.  Eyes-pupils equal round reactive light she does have prescription lenses visual acuity appears grossly intact.  Oropharynx is clear mucous membranes are moist  Chest is clear to auscultation without rhonchi rales or wheezes.  Heart regular rate and rhythm actually I did get a pulse in the 90s   Without murmur gallop or rub.  Abdomen soft obese soft nontender positive bowel sounds.  Extremities does ambulate in a wheelchair she has osteoarthritic changes of her knees bilaterally.--Apparently is able to transfer now-and her brother is comfortable with her status in this regard.  Neurologic-grossly intact no focal weaknesses speech is clear.  Psychiatric does appear to be somewhat anxious-but pleasant and appropriate.  Marland Kitchen  05/24/2012.  Sodium 139 potassium 4.2 BUN 10 creatinine 0.8.  Liver function tests within normal limits.  WBC 7.5 hemoglobin 13.9 platelets 188.  Assessment and plan.  #1-weakness-this appears to have improved somewhat-although she will need continued therapy certainly-her brother who had initial concerns prompting hospital admission appears to be much more comfortable with her status he is very supportive-continue to monitor.  #2-diabetes type 2-this appears stable currently diet controlled.  #3-back pain-this  appears stable currently receiving Vicodin as needed which appears to be effective she is also on Cymbalta.  #4-hypertension-this appears  stable she is on benazepril.  #5-hypothyroidism-continues on Synthroid Will update TSH before discharge.  #6-history of anxiety-continues on clonazepam this appears to be fairly effective.  #7-history depression-continue on Cymbalta.--At this point appears relatively stable-I suspect going home will help her mood as well.    Of not update CBC and metabolic panel before discharge as well  [id number]-of note  greater than 30 minutes spent preparing this discharge summary  [phone]    .

## 2012-12-05 ENCOUNTER — Emergency Department (HOSPITAL_COMMUNITY): Payer: Medicare Other

## 2012-12-05 ENCOUNTER — Emergency Department (HOSPITAL_COMMUNITY)
Admission: EM | Admit: 2012-12-05 | Discharge: 2012-12-06 | Disposition: A | Discharge status: Home / Self Care | Payer: Medicare Other | Attending: Emergency Medicine | Admitting: Emergency Medicine

## 2012-12-05 ENCOUNTER — Encounter (HOSPITAL_COMMUNITY): Payer: Self-pay | Admitting: Emergency Medicine

## 2012-12-05 DIAGNOSIS — I10 Essential (primary) hypertension: Secondary | ICD-10-CM | POA: Insufficient documentation

## 2012-12-05 DIAGNOSIS — Z79899 Other long term (current) drug therapy: Secondary | ICD-10-CM | POA: Insufficient documentation

## 2012-12-05 DIAGNOSIS — Z9010 Acquired absence of unspecified breast and nipple: Secondary | ICD-10-CM | POA: Insufficient documentation

## 2012-12-05 DIAGNOSIS — R3915 Urgency of urination: Secondary | ICD-10-CM | POA: Insufficient documentation

## 2012-12-05 DIAGNOSIS — M545 Low back pain, unspecified: Secondary | ICD-10-CM | POA: Insufficient documentation

## 2012-12-05 DIAGNOSIS — IMO0002 Reserved for concepts with insufficient information to code with codable children: Secondary | ICD-10-CM | POA: Insufficient documentation

## 2012-12-05 DIAGNOSIS — G8929 Other chronic pain: Secondary | ICD-10-CM | POA: Insufficient documentation

## 2012-12-05 DIAGNOSIS — Z8719 Personal history of other diseases of the digestive system: Secondary | ICD-10-CM | POA: Insufficient documentation

## 2012-12-05 DIAGNOSIS — Z853 Personal history of malignant neoplasm of breast: Secondary | ICD-10-CM | POA: Insufficient documentation

## 2012-12-05 DIAGNOSIS — E119 Type 2 diabetes mellitus without complications: Secondary | ICD-10-CM | POA: Insufficient documentation

## 2012-12-05 DIAGNOSIS — M549 Dorsalgia, unspecified: Secondary | ICD-10-CM

## 2012-12-05 DIAGNOSIS — Z9889 Other specified postprocedural states: Secondary | ICD-10-CM | POA: Insufficient documentation

## 2012-12-05 DIAGNOSIS — M25559 Pain in unspecified hip: Secondary | ICD-10-CM | POA: Insufficient documentation

## 2012-12-05 LAB — URINALYSIS, ROUTINE W REFLEX MICROSCOPIC
Bilirubin Urine: NEGATIVE
Specific Gravity, Urine: 1.018 (ref 1.005–1.030)
Urobilinogen, UA: 0.2 mg/dL (ref 0.0–1.0)

## 2012-12-05 LAB — URINE MICROSCOPIC-ADD ON

## 2012-12-05 MED ORDER — HYDROCODONE-ACETAMINOPHEN 5-325 MG PO TABS: 2.0000 | ORAL_TABLET | Freq: Four times a day (QID) | ORAL | Status: DC | PRN

## 2012-12-05 MED ORDER — HYDROMORPHONE HCL PF 1 MG/ML IJ SOLN
1.0000 mg | Freq: Once | INTRAMUSCULAR | Status: AC
  Administered 2012-12-05: 1 mg via INTRAMUSCULAR
  Filled 2012-12-05: qty 1

## 2012-12-05 NOTE — ED Notes (Signed)
Pt states that she has lt hip pain that radates to lt leg and hip. Started this am,

## 2012-12-05 NOTE — ED Provider Notes (Signed)
CSN: [id number]     Arrival date & time 12/05/12  1736 History   First MD Initiated Contact with Patient 12/05/12 1844     Chief Complaint  Patient presents with  . Back Pain   (Consider location/radiation/quality/duration/timing/severity/associated sxs/prior Treatment) HPI Comments: Patient presents to emergency department with the chief complaint of back pain and hip pain, which is chronic in nature. She states that she has been doing with these symptoms for the past 7 years, but they have been progressively worsening over the past several months. She denies any recent falls, or mechanism of injury. She states that she is normally seen by her primary care doctor and gets a shot in her hip.  She states the pain as moderate to severe. Her at home pain medicines are no longer controlling her symptoms. She denies any bowel or bladder incontinence, but it states that she has urinary urgency. She denies any saddle anesthesia. She has a past medical history remarkable for multiple back surgeries.   The history is provided by the patient. No language interpreter was used.    Past Medical History  Diagnosis Date  . Cancer   . Hypertension   . Diabetes mellitus   . GERD (gastroesophageal reflux disease)    Past Surgical History  Procedure Laterality Date  . Mastectomy    . Cholecystectomy     No family history on file. History  Substance Use Topics  . Smoking status: Never Smoker   . Smokeless tobacco: Never Used  . Alcohol Use: No   OB History   Grav Para Term Preterm Abortions TAB SAB Ect Mult Living                 Review of Systems  All other systems reviewed and are negative.    Allergies  Review of patient's allergies indicates no known allergies.  Home Medications   Current Outpatient Rx  Name  Route  Sig  Dispense  Refill  . benazepril (LOTENSIN) 20 MG tablet   Oral   Take 2 tablets (40 mg total) by mouth at bedtime.         . calcitonin, salmon,  (MIACALCIN/FORTICAL) 200 UNIT/ACT nasal spray   Nasal   Place 1 spray into the nose daily. Alternating sids of  Nose daily         . cephALEXin (KEFLEX) 500 MG capsule   Oral   Take 1 capsule (500 mg total) by mouth every 12 (twelve) hours. Until 2/25, then stop         . clonazePAM (KLONOPIN) 0.5 MG tablet   Oral   Take 1 tablet (0.5 mg total) by mouth 3 (three) times daily as needed for anxiety.   20 tablet   0   . clotrimazole-betamethasone (LOTRISONE) cream   Topical   Apply 1 application topically 2 (two) times daily.         . cyanocobalamin (,VITAMIN B-12,) 1000 MCG/ML injection   Subcutaneous   Inject 1,000 mcg into the skin every 30 (thirty) days.         . fluticasone (FLONASE) 50 MCG/ACT nasal spray   Nasal   Place 2 sprays into the nose at bedtime.         Marland Kitchen HYDROcodone-acetaminophen (NORCO/VICODIN) 5-325 MG per tablet      Take 1/2 tablet by mouth every 6 hours as needed for mild to moderate pain or Take (2) half tablets every 6 hours as needed for moderate to severe pain. **Note half  tabs   30 tablet   0   . levothyroxine (SYNTHROID, LEVOTHROID) 100 MCG tablet   Oral   Take 100 mcg by mouth every evening.         . loratadine (CLARITIN) 10 MG tablet   Oral   Take 10 mg by mouth every morning.         . nortriptyline (PAMELOR) 10 MG capsule   Oral   Take 1 capsule (10 mg total) by mouth at bedtime.         Marland Kitchen nystatin-triamcinolone (MYCOLOG II) cream   Topical   Apply topically 2 (two) times daily. For 4 days, then prn   30 g      . PARoxetine (PAXIL) 30 MG tablet   Oral   Take 15 mg by mouth 2 (two) times daily. Given in the morning and at bedtime         . Pitavastatin Calcium (LIVALO) 2 MG TABS   Oral   Take 1 mg by mouth every morning.         Marland Kitchen QUEtiapine (SEROQUEL) 25 MG tablet   Oral   Take 1 tablet (25 mg total) by mouth at bedtime. Given in the morning and at bedtime         . risperiDONE (RISPERDAL) 0.25 MG  tablet   Oral   Take 1 tablet (0.25 mg total) by mouth at bedtime.         . senna (SENOKOT) 8.6 MG TABS   Oral   Take 1 tablet (8.6 mg total) by mouth 2 (two) times daily.   120 each      . Vitamin D, Ergocalciferol, (DRISDOL) 50000 UNITS CAPS   Oral   Take 50,000 Units by mouth every 7 (seven) days. Taken on Thursdays          BP 159/99  Pulse 106  Temp(Src) 98.1 F (36.7 C) (Oral)  Resp 22  SpO2 96% Physical Exam  Nursing note and vitals reviewed. Constitutional: She is oriented to person, place, and time. She appears well-developed and well-nourished. No distress.  HENT:  Head: Normocephalic and atraumatic.  Eyes: Conjunctivae and EOM are normal. Right eye exhibits no discharge. Left eye exhibits no discharge. No scleral icterus.  Neck: Normal range of motion. Neck supple. No tracheal deviation present.  Cardiovascular: Normal rate, regular rhythm and normal heart sounds.  Exam reveals no gallop and no friction rub.   No murmur heard. Pulmonary/Chest: Effort normal and breath sounds normal. No respiratory distress. She has no wheezes.  Abdominal: Soft. She exhibits no distension. There is no tenderness.  Musculoskeletal: Normal range of motion.  Left-sided lumbar paraspinal muscles tender to palpation, no bony tenderness, step-offs, or gross abnormality or deformity of spine, patient is able to ambulate, moves all extremities  Neurological: She is alert and oriented to person, place, and time.  Sensation and strength intact bilaterally  Skin: Skin is warm. She is not diaphoretic.  Psychiatric: She has a normal mood and affect. Her behavior is normal. Judgment and thought content normal.    ED Course  Procedures (including critical care time) Results for orders placed during the hospital encounter of 12/05/12  URINALYSIS, ROUTINE W REFLEX MICROSCOPIC      Result Value Range   Color, Urine YELLOW  YELLOW   APPearance CLEAR  CLEAR   Specific Gravity, Urine 1.018   1.005 - 1.030   pH 7.0  5.0 - 8.0   Glucose, UA 100 (*) NEGATIVE mg/dL  Hgb urine dipstick SMALL (*) NEGATIVE   Bilirubin Urine NEGATIVE  NEGATIVE   Ketones, ur NEGATIVE  NEGATIVE mg/dL   Protein, ur NEGATIVE  NEGATIVE mg/dL   Urobilinogen, UA 0.2  0.0 - 1.0 mg/dL   Nitrite NEGATIVE  NEGATIVE   Leukocytes, UA NEGATIVE  NEGATIVE  URINE MICROSCOPIC-ADD ON      Result Value Range   Squamous Epithelial / LPF FEW (*) RARE   WBC, UA 0-2  <3 WBC/hpf   RBC / HPF 11-20  <3 RBC/hpf   Bacteria, UA RARE  RARE   Dg Lumbar Spine Complete  12/05/2012   *RADIOLOGY REPORT*  Clinical Data: Back pain.  No known injury.  LUMBAR SPINE - COMPLETE 4+ VIEW  Comparison: CT abdomen pelvis 01/03/2011  Findings:  Decreased bony mineralization.  There is stable grade III anterolisthesis of L5-1 S1, in this patient with known bilateral pars defect at L5. Stable grade 1 retrolisthesis of L2 on L3.  Severe compression deformity of T12 is stable.  L1 vertebral body height is preserved.  Stable moderate compression fracture of L2. L3 vertebral body height is preserved.  Stable mild to moderate superior endplate compression fracture of L4.  L5 vertebral body appears stable, with mild loss of the inferior endplate height. Mild convex left scoliosis.  No acute fracture is identified.  IMPRESSION: 1. The patient has multiple thoracolumbar compression fractures. These fractures appear stable compared to the CT of 01/03/2011.  No acute fracture deformities are identified. 2.  Stable grade III anterolisthesis of L5 on S1 and stable grade 1 retrolisthesis of L2 on L3.   Original Report Authenticated By: Britta Mccreedy, M.D.   Dg Pelvis 1-2 Views  12/05/2012   *RADIOLOGY REPORT*  Clinical Data: Pain.  No known injury.  PELVIS - 1-2 VIEW  Comparison: CT abdomen pelvis 01/03/2011  Findings: The bones appear osteopenic.  The pelvic ring appears intact.  Small sclerotic area in the intertrochanteric region of the right femur appears stable  compared to a CT of September 2012 and is likely a benign enchondroma.  No acute fracture is identified.  Mild degenerative changes of the pubic symphysis.  IMPRESSION: Decreased bony mineralization.  No acute bony abnormality is identified.  If there is clinical concern for radiographically occult fracture, pelvic MRI or CT could be considered for further evaluation.   Original Report Authenticated By: Britta Mccreedy, M.D.      MDM   1. Back pain   2. Chronic pain    Patient with chronic back pain.  Patient states that her pain has recently worsened.  Will check imaging, as her symptoms have recently worsened.  Patient with back pain.  No neurological deficits and normal neuro exam.  Patient can walk but states is painful.  No loss of bowel or bladder control.  No concern for cauda equina.  No fever, night sweats, weight loss, h/o cancer, IVDU.  RICE protocol and pain medicine indicated and discussed with patient.   Patient is able to ambulate. Patient has been seen by Dr. Estell Harpin.  Will discharge to home with PCP follow-up.   Roxy Horseman, PA-C 12/06/12 0025

## 2012-12-05 NOTE — ED Notes (Signed)
Secretary called [facility] for transport.

## 2012-12-07 NOTE — ED Provider Notes (Signed)
Medical screening examination/treatment/procedure(s) were performed by non-physician practitioner and as supervising physician I was immediately available for consultation/collaboration.   Raphel Stickles L Char Feltman, MD 12/07/12 1514 

## 2012-12-13 ENCOUNTER — Inpatient Hospital Stay (HOSPITAL_COMMUNITY)
Admission: EM | Admit: 2012-12-13 | Discharge: 2012-12-16 | DRG: 640 | Disposition: A | Discharge status: Home Health | Payer: Medicare Other | Attending: Internal Medicine | Admitting: Internal Medicine

## 2012-12-13 ENCOUNTER — Encounter (HOSPITAL_COMMUNITY): Payer: Self-pay | Admitting: Emergency Medicine

## 2012-12-13 DIAGNOSIS — F0390 Unspecified dementia without behavioral disturbance: Secondary | ICD-10-CM | POA: Diagnosis present

## 2012-12-13 DIAGNOSIS — G92 Toxic encephalopathy: Secondary | ICD-10-CM | POA: Diagnosis present

## 2012-12-13 DIAGNOSIS — E039 Hypothyroidism, unspecified: Secondary | ICD-10-CM | POA: Diagnosis present

## 2012-12-13 DIAGNOSIS — F341 Dysthymic disorder: Secondary | ICD-10-CM

## 2012-12-13 DIAGNOSIS — E119 Type 2 diabetes mellitus without complications: Secondary | ICD-10-CM | POA: Diagnosis present

## 2012-12-13 DIAGNOSIS — K219 Gastro-esophageal reflux disease without esophagitis: Secondary | ICD-10-CM | POA: Diagnosis present

## 2012-12-13 DIAGNOSIS — F3289 Other specified depressive episodes: Secondary | ICD-10-CM | POA: Diagnosis present

## 2012-12-13 DIAGNOSIS — G929 Unspecified toxic encephalopathy: Secondary | ICD-10-CM | POA: Diagnosis present

## 2012-12-13 DIAGNOSIS — F418 Other specified anxiety disorders: Secondary | ICD-10-CM | POA: Diagnosis present

## 2012-12-13 DIAGNOSIS — R109 Unspecified abdominal pain: Secondary | ICD-10-CM | POA: Diagnosis present

## 2012-12-13 DIAGNOSIS — E871 Hypo-osmolality and hyponatremia: Principal | ICD-10-CM | POA: Diagnosis present

## 2012-12-13 DIAGNOSIS — N390 Urinary tract infection, site not specified: Secondary | ICD-10-CM | POA: Diagnosis present

## 2012-12-13 DIAGNOSIS — F329 Major depressive disorder, single episode, unspecified: Secondary | ICD-10-CM | POA: Diagnosis present

## 2012-12-13 DIAGNOSIS — I10 Essential (primary) hypertension: Secondary | ICD-10-CM | POA: Diagnosis present

## 2012-12-13 DIAGNOSIS — F411 Generalized anxiety disorder: Secondary | ICD-10-CM | POA: Diagnosis present

## 2012-12-13 LAB — CBC WITH DIFFERENTIAL/PLATELET
Eosinophils Relative: 1 % (ref 0–5)
HCT: 44.3 % (ref 36.0–46.0)
Lymphocytes Relative: 18 % (ref 12–46)
Lymphs Abs: 1 10*3/uL (ref 0.7–4.0)
MCV: 87.7 fL (ref 78.0–100.0)
Monocytes Absolute: 0.5 10*3/uL (ref 0.1–1.0)
Monocytes Relative: 9 % (ref 3–12)
RBC: 5.05 MIL/uL (ref 3.87–5.11)
WBC: 5.7 10*3/uL (ref 4.0–10.5)

## 2012-12-13 LAB — BASIC METABOLIC PANEL
BUN: 7 mg/dL (ref 6–23)
CO2: 30 mEq/L (ref 19–32)
Calcium: 9.7 mg/dL (ref 8.4–10.5)
Creatinine, Ser: 0.72 mg/dL (ref 0.50–1.10)
Glucose, Bld: 126 mg/dL — ABNORMAL HIGH (ref 70–99)

## 2012-12-13 LAB — URINALYSIS, ROUTINE W REFLEX MICROSCOPIC
Glucose, UA: NEGATIVE mg/dL
Hgb urine dipstick: NEGATIVE
Specific Gravity, Urine: 1.016 (ref 1.005–1.030)

## 2012-12-13 LAB — URINE MICROSCOPIC-ADD ON

## 2012-12-13 MED ORDER — SODIUM CHLORIDE 0.9 % IV SOLN: Freq: Once | INTRAVENOUS | Status: DC

## 2012-12-13 MED ORDER — HYDROCODONE-ACETAMINOPHEN 5-325 MG PO TABS
1.0000 | ORAL_TABLET | Freq: Once | ORAL | Status: AC
  Administered 2012-12-13: 1 via ORAL
  Filled 2012-12-13: qty 1

## 2012-12-13 MED ORDER — SODIUM CHLORIDE 0.9 % IV BOLUS (SEPSIS)
250.0000 mL | Freq: Once | INTRAVENOUS | Status: AC
  Administered 2012-12-13: 250 mL via INTRAVENOUS

## 2012-12-13 NOTE — H&P (Signed)
[facility] History and Physical  Alexis Reese JYN:[id number] DOB: 12-05-1934 DOA: 12/13/2012  Referring physician: Onalee Hua working with Dr. Radford Pax PCP: Josue Hector, MD  Specialists: none  Chief Complaint: lower abdominal discomfort, increased confusion  and decreased food intake   HPI: Alexis Reese is a 78 y.o. female  With past h/o DM type 2, HTN, depression with anxiety, mild dementia and UTI who presents to ED with lower abdominal discomfort, increased confusion and decreased PO intake.  Pt. was in ED approximately a week ago with UTI. Pt complains of lower abdominal pain that occurs when urinating, urinary frequency, incomplete emptying, chills and decreased appetite. Pt denies  fever, dysuria and difficulty swallowing. Family reports that patient's confusion has increased over the past week and patient is not eating or drinking well   In ED pt was found to have a low sodium level of 126, chloride level of 88 and leukocytes in UA  Review of Systems: The patient denies fever, chest pain, shortness of breathe, nausea, vomiting,  hematuria and blood in stool. No rashes, no LOC, no allergies, no tinnitis, no dysphagia or odynophagia   Past Medical History  Diagnosis Date  . Cancer   . Hypertension   . Diabetes mellitus   . GERD (gastroesophageal reflux disease)    Past Surgical History  Procedure Laterality Date  . Mastectomy    . Cholecystectomy     Social History:  reports that she has never smoked. She has never used smokeless tobacco. She reports that she does not drink alcohol or use illicit drugs. With family   No Known Allergies  History reviewed. No pertinent family history. Father had stroke, date unknown  Prior to Admission medications   Medication Sig Start Date End Date Taking? Authorizing Provider  benazepril (LOTENSIN) 20 MG tablet Take 20 mg by mouth 2 (two) times daily.   Yes Historical Provider, MD  calcitonin, salmon, (MIACALCIN/FORTICAL) 200  UNIT/ACT nasal spray Place 1 spray into the nose daily. Alternating sids of  Nose daily   Yes Historical Provider, MD  clonazePAM (KLONOPIN) 0.5 MG tablet Take 1 tablet (0.5 mg total) by mouth 3 (three) times daily as needed for anxiety. 05/27/12  Yes Christiane Ha, MD  cyanocobalamin (,VITAMIN B-12,) 1000 MCG/ML injection Inject 1,000 mcg into the skin every 30 (thirty) days.   Yes Historical Provider, MD  DULoxetine (CYMBALTA) 30 MG capsule Take 30 mg by mouth daily.   Yes Historical Provider, MD  fluticasone (FLONASE) 50 MCG/ACT nasal spray Place 2 sprays into the nose every morning.    Yes Historical Provider, MD  HYDROcodone-acetaminophen (NORCO/VICODIN) 5-325 MG per tablet Take 0.5-2 tablets by mouth every 6 (six) hours as needed for pain.   Yes Historical Provider, MD  HYDROcodone-acetaminophen (NORCO/VICODIN) 5-325 MG per tablet Take 2 tablets by mouth every 6 (six) hours as needed for pain. 12/05/12  Yes Roxy Horseman, PA-C  levothyroxine (SYNTHROID, LEVOTHROID) 125 MCG tablet Take 125 mcg by mouth daily before breakfast.   Yes Historical Provider, MD  loratadine (CLARITIN) 10 MG tablet Take 10 mg by mouth every morning.   Yes Historical Provider, MD  nortriptyline (PAMELOR) 10 MG capsule Take 1 capsule (10 mg total) by mouth at bedtime. 05/27/12  Yes Christiane Ha, MD  PARoxetine (PAXIL) 30 MG tablet Take 60 mg by mouth at bedtime.   Yes Historical Provider, MD  Pitavastatin Calcium (LIVALO) 2 MG TABS Take 1 mg by mouth every morning.   Yes Historical Provider, MD  QUEtiapine (SEROQUEL) 50 MG tablet Take 50 mg by mouth at bedtime.   Yes Historical Provider, MD  risperiDONE (RISPERDAL) 0.25 MG tablet Take 0.25 mg by mouth 3 (three) times daily.   Yes Historical Provider, MD  Vitamin D, Ergocalciferol, (DRISDOL) 50000 UNITS CAPS Take 50,000 Units by mouth every 7 (seven) days. Taken on Thursdays   Yes Historical Provider, MD   Physical Exam: Filed Vitals:   12/13/12 2041  BP:    Pulse:   Temp: 99.6 F (37.6 C)  Resp:      General:  Pt alert in NAD, lying supine   Eyes:  Pupils are equal, round and reactive to light, EOMI, non icteric  ENT: external appearance normal with dry MM  Neck:supple with no goiter noted  Cardiovascular: RRR with no murmurs   Respiratory:   Regular, non labored. CTA BL, no wheeze or rhonchi.   Abdomen:  Bowel sounds x 4, soft NT, ND   Skin: warm and dry    Musculoskeletal: no cyanosis or clubbing present, no CVA tenderness   Psychiatric:  Normal mood and affect,  pleasantly confused  Neurologic: alert, answers questions with mild confusion, moves all extremities  Labs on Admission:  Basic Metabolic Panel:  Recent Labs Lab 12/13/12 1939  NA 126*  K 4.4  CL 88*  CO2 30  GLUCOSE 126*  BUN 7  CREATININE 0.72  CALCIUM 9.7   Liver Function Tests: No results found for this basename: AST, ALT, ALKPHOS, BILITOT, PROT, ALBUMIN,  in the last 168 hours No results found for this basename: LIPASE, AMYLASE,  in the last 168 hours No results found for this basename: AMMONIA,  in the last 168 hours CBC:  Recent Labs Lab 12/13/12 1939  WBC 5.7  NEUTROABS 4.1  HGB 15.6*  HCT 44.3  MCV 87.7  PLT 170   Cardiac Enzymes: No results found for this basename: CKTOTAL, CKMB, CKMBINDEX, TROPONINI,  in the last 168 hours  BNP (last 3 results) No results found for this basename: PROBNP,  in the last 8760 hours CBG: No results found for this basename: GLUCAP,  in the last 168 hours  Radiological Exams on Admission: No results found.    Assessment/Plan Principal Problem:   Hyponatremia Active Problems:   Depression with anxiety   DM type 2 (diabetes mellitus, type 2)   HTN (hypertension)   UTI (lower urinary tract infection)   1. Hyponatremia -  NA level 126 and chloride 88, most likely due to poor oral solute intake given history, family mentioned that patient requires dentures but does not have any at home. -   Will start normal saline -Encouraged patient to increase oral intake  -BMP to be checked in AM. - consult dietician   2. UTI -  Will start on Rocephin  -f/u with urine culture -lower abdominal discomfort likely related to UTI   3.  Increased confusion likely related to #2 - pt has underlying dementia - Will check Vit B 12 level  4. Depression with anxiety -currently stable - continue home medications  5. HTN -currently stable  -continue home medications  6. DVT prophylaxis  - start Heparin SQ  7. Hypothyroidism - stable  - continue home medication  8. Type 2 DM - start on SSI -CGB per protocol -Diabetic diet   Code Status: Full   Family Communication: family at bedside Disposition Plan: pending results for BMP in AM, may discharge home   Time spent:>60 minutes  Alexis Reese, [facility]  Pager [phone]  If 7PM-7AM, please contact night-coverage [email or web address] Password [email or web address] 12/13/2012, 11:35 PM

## 2012-12-13 NOTE — ED Notes (Addendum)
Per EMS patient from home with Hx of anxiety and UTI "is burning down there," with dysuria and pain in the lower abdomen, denies odor, denies hematuria. Patient was seen here on 9/1 and catheterization was attempted unsuccessfully twice. Per EMS patient is A&O x 4. On assessment patient is unable to provide consistent history. Patient states that pain is only present in left hip and lower back. When asked about dysuria or pain around her urethra, patient denies pain here. Brother at bedside, states that patient has complained of perineum pain and lower abdominal pain. Patient states that reason she is here today is because she is "burning up inside and sometimes cold outside," unable to pinpoint a location of the burning up sensation, states that it is all over inside. Patient's brother unable to clarify further.

## 2012-12-13 NOTE — ED Notes (Signed)
Bed: ZO10 Expected date:  Expected time:  Means of arrival:  Comments: ems- elderly, dysuria

## 2012-12-13 NOTE — ED Notes (Signed)
Pt unable to void at this time. 

## 2012-12-13 NOTE — ED Provider Notes (Signed)
CSN: [id number]     Arrival date & time 12/13/12  1843 History   First MD Initiated Contact with Patient 12/13/12 1912     Chief Complaint  Patient presents with  . Dysuria   (Consider location/radiation/quality/duration/timing/severity/associated sxs/prior Treatment) Patient is a 78 y.o. female presenting with dysuria. The history is provided by the patient and a relative. No language interpreter was used.  Dysuria Pain quality:  Burning Pain severity:  Moderate Onset quality:  Gradual Timing:  Intermittent Progression:  Waxing and waning Chronicity:  Recurrent Recent urinary tract infections: yes   Associated symptoms: abdominal pain   Associated symptoms: no nausea and no vomiting   Associated symptoms comment:  No documented fever, however, patient reports alternating cold and hot spells.   Past Medical History  Diagnosis Date  . Cancer   . Hypertension   . Diabetes mellitus   . GERD (gastroesophageal reflux disease)    Past Surgical History  Procedure Laterality Date  . Mastectomy    . Cholecystectomy     History reviewed. No pertinent family history. History  Substance Use Topics  . Smoking status: Never Smoker   . Smokeless tobacco: Never Used  . Alcohol Use: No   OB History   Grav Para Term Preterm Abortions TAB SAB Ect Mult Living                 Review of Systems  Respiratory: Negative for cough and shortness of breath.   Cardiovascular: Negative for chest pain.  Gastrointestinal: Positive for abdominal pain. Negative for nausea and vomiting.  Genitourinary: Positive for dysuria.  All other systems reviewed and are negative.    Allergies  Review of patient's allergies indicates no known allergies.  Home Medications   Current Outpatient Rx  Name  Route  Sig  Dispense  Refill  . benazepril (LOTENSIN) 20 MG tablet   Oral   Take 20 mg by mouth 2 (two) times daily.         . calcitonin, salmon, (MIACALCIN/FORTICAL) 200 UNIT/ACT nasal spray  Nasal   Place 1 spray into the nose daily. Alternating sids of  Nose daily         . clonazePAM (KLONOPIN) 0.5 MG tablet   Oral   Take 1 tablet (0.5 mg total) by mouth 3 (three) times daily as needed for anxiety.   20 tablet   0   . cyanocobalamin (,VITAMIN B-12,) 1000 MCG/ML injection   Subcutaneous   Inject 1,000 mcg into the skin every 30 (thirty) days.         . DULoxetine (CYMBALTA) 30 MG capsule   Oral   Take 30 mg by mouth daily.         . fluticasone (FLONASE) 50 MCG/ACT nasal spray   Nasal   Place 2 sprays into the nose every morning.          Marland Kitchen HYDROcodone-acetaminophen (NORCO/VICODIN) 5-325 MG per tablet   Oral   Take 0.5-2 tablets by mouth every 6 (six) hours as needed for pain.         Marland Kitchen HYDROcodone-acetaminophen (NORCO/VICODIN) 5-325 MG per tablet   Oral   Take 2 tablets by mouth every 6 (six) hours as needed for pain.   15 tablet   0   . levothyroxine (SYNTHROID, LEVOTHROID) 125 MCG tablet   Oral   Take 125 mcg by mouth daily before breakfast.         . loratadine (CLARITIN) 10 MG tablet   Oral  Take 10 mg by mouth every morning.         . nortriptyline (PAMELOR) 10 MG capsule   Oral   Take 1 capsule (10 mg total) by mouth at bedtime.         Marland Kitchen PARoxetine (PAXIL) 30 MG tablet   Oral   Take 60 mg by mouth at bedtime.         . Pitavastatin Calcium (LIVALO) 2 MG TABS   Oral   Take 1 mg by mouth every morning.         Marland Kitchen QUEtiapine (SEROQUEL) 50 MG tablet   Oral   Take 50 mg by mouth at bedtime.         . risperiDONE (RISPERDAL) 0.25 MG tablet   Oral   Take 0.25 mg by mouth 3 (three) times daily.         . Vitamin D, Ergocalciferol, (DRISDOL) 50000 UNITS CAPS   Oral   Take 50,000 Units by mouth every 7 (seven) days. Taken on Thursdays          BP 151/92  Pulse 101  Temp(Src) 98.1 F (36.7 C) (Oral)  Resp 18  SpO2 98% Physical Exam  Nursing note and vitals reviewed. Constitutional: She appears well-developed.   HENT:  Head: Normocephalic.  Eyes: Pupils are equal, round, and reactive to light.  Neck: Normal range of motion.  Cardiovascular: Normal rate and regular rhythm.   Pulmonary/Chest: Effort normal and breath sounds normal.  Abdominal: Soft. Bowel sounds are normal. There is tenderness.  Musculoskeletal: Normal range of motion.  Lymphadenopathy:    She has no cervical adenopathy.  Neurological: She is alert.  Skin: Skin is warm and dry.  Psychiatric: She has a normal mood and affect. Her behavior is normal. Judgment and thought content normal.    ED Course  Procedures (including critical care time) Labs Review Labs Reviewed  URINALYSIS, ROUTINE W REFLEX MICROSCOPIC   Imaging Review No results found. Labs reviewed.  Patient with hyponatremia (126).  Initiated saline bolus of 250 cc, followed by maintenance at 125 cc/hr.  Discussed with Dr. Radford Pax.  Spoke with hospitalist Cena Benton) for admission.  MDM   Hyponatremia.    Jimmye Norman, NP 12/14/12 [phone]

## 2012-12-13 NOTE — ED Notes (Signed)
Gave I Stat-CG4 to Dr. Radford Pax.  Within normal limits.

## 2012-12-13 NOTE — ED Notes (Signed)
David Smith, NP at bedside  

## 2012-12-14 ENCOUNTER — Encounter (HOSPITAL_COMMUNITY): Payer: Self-pay

## 2012-12-14 DIAGNOSIS — G92 Toxic encephalopathy: Secondary | ICD-10-CM | POA: Diagnosis present

## 2012-12-14 LAB — BASIC METABOLIC PANEL
BUN: 8 mg/dL (ref 6–23)
CO2: 28 mEq/L (ref 19–32)
Chloride: 92 mEq/L — ABNORMAL LOW (ref 96–112)
Creatinine, Ser: 0.6 mg/dL (ref 0.50–1.10)

## 2012-12-14 LAB — CBC
HCT: 38.2 % (ref 36.0–46.0)
MCHC: 35.6 g/dL (ref 30.0–36.0)
MCV: 88.2 fL (ref 78.0–100.0)
RDW: 12.3 % (ref 11.5–15.5)

## 2012-12-14 LAB — GLUCOSE, CAPILLARY

## 2012-12-14 MED ORDER — HYDROCODONE-ACETAMINOPHEN 5-325 MG PO TABS
1.0000 | ORAL_TABLET | Freq: Four times a day (QID) | ORAL | Status: DC | PRN
  Administered 2012-12-14 – 2012-12-16 (×7): 1 via ORAL
  Filled 2012-12-14 (×8): qty 1

## 2012-12-14 MED ORDER — ATORVASTATIN CALCIUM 10 MG PO TABS
10.0000 mg | ORAL_TABLET | Freq: Every day | ORAL | Status: DC
  Administered 2012-12-14: 10 mg via ORAL
  Filled 2012-12-14 (×3): qty 1

## 2012-12-14 MED ORDER — DEXTROSE 5 % IV SOLN
1.0000 g | Freq: Every day | INTRAVENOUS | Status: DC
  Administered 2012-12-14 (×2): 1 g via INTRAVENOUS
  Filled 2012-12-14 (×3): qty 10

## 2012-12-14 MED ORDER — BENAZEPRIL HCL 20 MG PO TABS
20.0000 mg | ORAL_TABLET | Freq: Two times a day (BID) | ORAL | Status: DC
  Administered 2012-12-14 – 2012-12-16 (×5): 20 mg via ORAL
  Filled 2012-12-14 (×7): qty 1

## 2012-12-14 MED ORDER — PAROXETINE HCL 20 MG PO TABS
60.0000 mg | ORAL_TABLET | Freq: Every day | ORAL | Status: DC
Start: 2012-12-14 — End: 2012-12-14
  Administered 2012-12-14: 60 mg via ORAL
  Filled 2012-12-14 (×2): qty 3

## 2012-12-14 MED ORDER — RISPERIDONE 0.25 MG PO TABS
0.2500 mg | ORAL_TABLET | Freq: Three times a day (TID) | ORAL | Status: DC
  Administered 2012-12-14: 0.25 mg via ORAL
  Filled 2012-12-14 (×3): qty 1

## 2012-12-14 MED ORDER — CLONAZEPAM 0.5 MG PO TABS
0.5000 mg | ORAL_TABLET | Freq: Two times a day (BID) | ORAL | Status: DC | PRN
  Administered 2012-12-15 – 2012-12-16 (×3): 0.5 mg via ORAL
  Filled 2012-12-14 (×3): qty 1

## 2012-12-14 MED ORDER — ENSURE COMPLETE PO LIQD
237.0000 mL | Freq: Every day | ORAL | Status: DC
  Administered 2012-12-14 – 2012-12-16 (×3): 237 mL via ORAL

## 2012-12-14 MED ORDER — SODIUM CHLORIDE 0.9 % IV SOLN
INTRAVENOUS | Status: DC
  Administered 2012-12-14 – 2012-12-16 (×4): via INTRAVENOUS

## 2012-12-14 MED ORDER — QUETIAPINE FUMARATE 50 MG PO TABS
50.0000 mg | ORAL_TABLET | Freq: Every day | ORAL | Status: DC
  Administered 2012-12-14 – 2012-12-15 (×3): 50 mg via ORAL
  Filled 2012-12-14 (×4): qty 1

## 2012-12-14 MED ORDER — INFLUENZA VAC SPLIT QUAD 0.5 ML IM SUSP
0.5000 mL | INTRAMUSCULAR | Status: AC
  Administered 2012-12-15: 0.5 mL via INTRAMUSCULAR
  Filled 2012-12-14 (×2): qty 0.5

## 2012-12-14 MED ORDER — PAROXETINE HCL 20 MG PO TABS
40.0000 mg | ORAL_TABLET | Freq: Every day | ORAL | Status: DC
  Administered 2012-12-14 – 2012-12-15 (×2): 40 mg via ORAL
  Filled 2012-12-14 (×3): qty 2

## 2012-12-14 MED ORDER — HEPARIN SODIUM (PORCINE) 5000 UNIT/ML IJ SOLN
5000.0000 [IU] | Freq: Three times a day (TID) | INTRAMUSCULAR | Status: DC
  Administered 2012-12-14 – 2012-12-16 (×8): 5000 [IU] via SUBCUTANEOUS
  Filled 2012-12-14 (×11): qty 1

## 2012-12-14 MED ORDER — LEVOTHYROXINE SODIUM 125 MCG PO TABS
125.0000 ug | ORAL_TABLET | Freq: Every day | ORAL | Status: DC
  Administered 2012-12-14 – 2012-12-16 (×2): 125 ug via ORAL
  Filled 2012-12-14 (×4): qty 1

## 2012-12-14 MED ORDER — INSULIN ASPART 100 UNIT/ML ~~LOC~~ SOLN
0.0000 [IU] | Freq: Three times a day (TID) | SUBCUTANEOUS | Status: DC
  Administered 2012-12-14 – 2012-12-16 (×4): 3 [IU] via SUBCUTANEOUS

## 2012-12-14 MED ORDER — DULOXETINE HCL 30 MG PO CPEP
30.0000 mg | ORAL_CAPSULE | Freq: Every day | ORAL | Status: DC
  Administered 2012-12-14: 30 mg via ORAL
  Filled 2012-12-14: qty 1

## 2012-12-14 NOTE — Progress Notes (Signed)
INITIAL NUTRITION ASSESSMENT  DOCUMENTATION CODES Per approved criteria  -Obesity Unspecified   INTERVENTION: - Ensure Complete daily - Encouraged increased PO intake - Will continue to monitor   NUTRITION DIAGNOSIS: Inadequate oral intake related to poor appetite as evidenced by pt report.   Goal: Pt to consume >90% of meals/supplements.   Monitor:  Weights, labs, intake  Reason for Assessment: Nutrition risk  78 y.o. female  Admitting Dx: Hyponatremia  ASSESSMENT: Pt discussed during multidisciplinary rounds.   Pt with history of diabetes, hypertension, mild dementia, depression and anxiety was admitted on 12/13/2012 with increased confusion, lower abdominal pain and decreased by mouth intake.   Met with pt who reports poor intake for the past week with pt consuming only 1 meal/day. States she has lost 12 pounds but is unsure of the time frame. Reports drinking 1 Ensure/day. C/o ongoing lack of appetite today however pt ate 95% of breakfast.   Lab Results  Component Value Date   HGBA1C 7.6* 05/24/2012    Height: Ht Readings from Last 1 Encounters:  12/14/12 5\' 2"  (1.575 m)    Weight: Wt Readings from Last 1 Encounters:  12/14/12 184 lb 8.4 oz (83.7 kg)    Ideal Body Weight: 110 lb   % Ideal Body Weight: 167%  Wt Readings from Last 10 Encounters:  12/14/12 184 lb 8.4 oz (83.7 kg)  05/24/12 175 lb (79.379 kg)    Usual Body Weight: 196 lb per pt  % Usual Body Weight: 94%  BMI:  Body mass index is 33.74 kg/(m^2). Class I obesity  Estimated Nutritional Needs: Kcal: 1250-1450 Protein: 55-65g Fluid: 1.2-1.4L/day  Skin: Intact   Diet Order: Dysphagia 3, thin  EDUCATION NEEDS: -No education needs identified at this time   Intake/Output Summary (Last 24 hours) at 12/14/12 1316 Last data filed at 12/14/12 0900  Gross per 24 hour  Intake    360 ml  Output      0 ml  Net    360 ml    Last BM: 9/10  Labs:   Recent Labs Lab 12/13/12 1939  12/14/12 0645  NA 126* 127*  K 4.4 4.1  CL 88* 92*  CO2 30 28  BUN 7 8  CREATININE 0.72 0.60  CALCIUM 9.7 8.9  GLUCOSE 126* 128*    CBG (last 3)   Recent Labs  12/14/12 0141 12/14/12 0750 12/14/12 1234  GLUCAP 121* 120* 156*    Scheduled Meds: . atorvastatin  10 mg Oral q1800  . benazepril  20 mg Oral BID  . cefTRIAXone (ROCEPHIN)  IV  1 g Intravenous QHS  . DULoxetine  30 mg Oral Daily  . heparin  5,000 Units Subcutaneous Q8H  . [START ON 12/15/2012] influenza vac split quadrivalent PF  0.5 mL Intramuscular Tomorrow-1000  . insulin aspart  0-15 Units Subcutaneous TID WC  . levothyroxine  125 mcg Oral QAC breakfast  . PARoxetine  60 mg Oral QHS  . QUEtiapine  50 mg Oral QHS  . risperiDONE  0.25 mg Oral TID    Continuous Infusions: . sodium chloride 100 mL/hr at 12/14/12 0128    Past Medical History  Diagnosis Date  . Cancer   . Hypertension   . Diabetes mellitus   . GERD (gastroesophageal reflux disease)     Past Surgical History  Procedure Laterality Date  . Mastectomy    . Cholecystectomy      Levon Hedger MS, RD, LDN [phone] Pager [phone] After Hours Pager

## 2012-12-14 NOTE — ED Provider Notes (Signed)
Medical screening examination/treatment/procedure(s) were performed by non-physician practitioner and as supervising physician I was immediately available for consultation/collaboration.    Nelia Shi, MD 12/14/12 [phone]

## 2012-12-14 NOTE — Care Management Note (Signed)
Cm spoke with patient at the bedside concerning dc planning. Pt states resides home with adult son and brother who assist in home care. Pt uses [facility] in [place]. PCP: Josue Hector, MD. Pt states ambulates with RW at home. Awaiting MD order for PT/OT eval to further assess home needs.    Roxy Manns Ashtynn Berke,RN,MSN [phone]

## 2012-12-14 NOTE — Progress Notes (Signed)
Patient declines MyChart activation-no computer 

## 2012-12-14 NOTE — Progress Notes (Addendum)
[facility] PROGRESS NOTE  MORNING HALBERG LKG:[id number] DOB: 10-Apr-1934 DOA: 12/13/2012 PCP: Josue Hector, MD  Brief narrative: Alexis Reese is an 78 y.o. female with a PMH of diabetes, hypertension, mild dementia, depression and anxiety was admitted on 12/13/2012 with increased confusion, lower abdominal pain and decreased by mouth intake. Upon initial evaluation emergency department, the patient was noted to have a sodium of 126 and evidence of a UTI with leukocytes in her urine.  Assessment/Plan: Principal Problem:   Toxic and metabolic encephalopathy secondary to Hyponatremia and polypharmacy -Check urine sodium and urine osmolality as well as serum osmolality. -Discontinue Cymbalta and begin to wean Paxil as these are likely contributing to her significant hyponatremia. -Discontinue Risperdal (already on Seroquel). Active Problems:   Depression with anxiety -On multiple psychotropic medications including Paxil, Seroquel, Seroquel and Cymbalta. Discontinue Cymbalta and Risperdal.   DM type 2 (diabetes mellitus, type 2) -On moderate scale SSI Q. A.c. -CBGs 120-156.   HTN (hypertension) -Continue Lotensin.   UTI (lower urinary tract infection) -Continue empiric Rocephin. Followup culture results.  Code Status: Full. Family Communication: Brother Lodema Hong [phone]).  Not at bedside, unable to leave message on phone number provided. Spoke with him later in the afternoon and person at the bedside. Disposition Plan: Home when stable.   Medical Consultants:  None.  Other Consultants:  None.  Anti-infectives:  Rocephin 12/13/2012--->  HPI/Subjective: Alexis Reese tells me that she still has some lower abdominal pain. Thinks she moved her bowels yesterday and denies being constipated. No other pain. No dyspnea or cough.  Objective: Filed Vitals:   12/13/12 2041 12/14/12 0054 12/14/12 0128 12/14/12 0628  BP:  147/96 153/75 115/56  Pulse:  90 86 90  Temp:  99.6 F (37.6 C)  98.4 F (36.9 C) 98 F (36.7 C)  TempSrc: Rectal  Oral Oral  Resp:   20 20  Height:   5\' 2"  (1.575 m)   Weight:   83.7 kg (184 lb 8.4 oz)   SpO2:  94% 97% 93%    Intake/Output Summary (Last 24 hours) at 12/14/12 1250 Last data filed at 12/14/12 0900  Gross per 24 hour  Intake    360 ml  Output      0 ml  Net    360 ml    Exam: Gen:  NAD Cardiovascular:  RRR, No M/R/G Respiratory:  Lungs CTAB Gastrointestinal:  Abdomen soft, NT/ND, + BS Extremities:  No C/E/C  Data Reviewed: Basic Metabolic Panel:  Recent Labs Lab 12/13/12 1939 12/14/12 0645  NA 126* 127*  K 4.4 4.1  CL 88* 92*  CO2 30 28  GLUCOSE 126* 128*  BUN 7 8  CREATININE 0.72 0.60  CALCIUM 9.7 8.9   GFR Estimated Creatinine Clearance: 60 ml/min (by C-G formula based on Cr of 0.6).  CBC:  Recent Labs Lab 12/13/12 1939 12/14/12 0645  WBC 5.7 6.0  NEUTROABS 4.1  --   HGB 15.6* 13.6  HCT 44.3 38.2  MCV 87.7 88.2  PLT 170 172   CBG:  Recent Labs Lab 12/14/12 0141 12/14/12 0750 12/14/12 1234  GLUCAP 121* 120* 156*   Thyroid function studies No results found for this basename: TSH, T4TOTAL, FREET3, T3FREE, THYROIDAB,  in the last 72 hours Microbiology No results found for this or any previous visit (from the past 240 hour(s)).   Procedures and Diagnostic Studies: None.  Scheduled Meds: . atorvastatin  10 mg Oral q1800  . benazepril  20 mg Oral BID  .  cefTRIAXone (ROCEPHIN)  IV  1 g Intravenous QHS  . DULoxetine  30 mg Oral Daily  . heparin  5,000 Units Subcutaneous Q8H  . [START ON 12/15/2012] influenza vac split quadrivalent PF  0.5 mL Intramuscular Tomorrow-1000  . insulin aspart  0-15 Units Subcutaneous TID WC  . levothyroxine  125 mcg Oral QAC breakfast  . PARoxetine  60 mg Oral QHS  . QUEtiapine  50 mg Oral QHS  . risperiDONE  0.25 mg Oral TID   Continuous Infusions: . sodium chloride 100 mL/hr at 12/14/12 0128    Time spent: 25 minutes.   LOS: 1 day    RAMA,CHRISTINA  [facility] Pager [phone].   *Please note that the hospitalists switch teams on Wednesdays. Please call the flow manager at [phone] if you are having difficulty reaching the hospitalist taking care of this patient as she can update you and provide the most up-to-date pager number of provider caring for the patient. If 8PM-8AM, please contact night-coverage at [email or web address], password [facility]  12/14/2012, 12:50 PM

## 2012-12-15 LAB — BASIC METABOLIC PANEL
CO2: 29 mEq/L (ref 19–32)
Calcium: 8.8 mg/dL (ref 8.4–10.5)
Creatinine, Ser: 0.67 mg/dL (ref 0.50–1.10)
Glucose, Bld: 126 mg/dL — ABNORMAL HIGH (ref 70–99)

## 2012-12-15 LAB — URINE CULTURE: Colony Count: 80000

## 2012-12-15 LAB — GLUCOSE, CAPILLARY
Glucose-Capillary: 142 mg/dL — ABNORMAL HIGH (ref 70–99)
Glucose-Capillary: 163 mg/dL — ABNORMAL HIGH (ref 70–99)
Glucose-Capillary: 151 mg/dL — ABNORMAL HIGH (ref 70–99)

## 2012-12-15 MED ORDER — POLYETHYLENE GLYCOL 3350 17 G PO PACK
17.0000 g | PACK | Freq: Every day | ORAL | Status: DC
  Administered 2012-12-16: 17 g via ORAL
  Filled 2012-12-15 (×2): qty 1

## 2012-12-15 NOTE — Progress Notes (Signed)
[facility] PROGRESS NOTE  Alexis Reese MVH:[id number] DOB: [birth year] DOA: 12/13/2012 PCP: Josue Hector, MD  Brief narrative: Alexis Reese is an 78 y.o. female with a PMH of diabetes, hypertension, mild dementia, depression and anxiety was admitted on 12/13/2012 with increased confusion, lower abdominal pain and decreased by mouth intake. Upon initial evaluation emergency department, the patient was noted to have a sodium of 126 and evidence of a UTI with leukocytes in her urine.  Assessment/Plan: Principal Problem:   Toxic and metabolic encephalopathy secondary to Hyponatremia and polypharmacy -Appears to be related to SSRI therapy. Sodium improving with discontinuation of Cymbalta and a lower dose of Paxil. -Risperdal discontinued (already on Seroquel). -PT evaluation pending. Increase mobilization. Active Problems:   Depression with anxiety -On multiple psychotropic medications including Paxil, Seroquel, Seroquel and Cymbalta. Discontinued Cymbalta and Risperdal. Paxil dose being weaned down. -TSH within normal limits.   DM type 2 (diabetes mellitus, type 2) -On moderate scale SSI Q. A.c. -CBGs 142-163.   HTN (hypertension) -Continue Lotensin.   UTI (lower urinary tract infection) -Stop empiric Rocephin. Urine cultures grew 80,000 colonies of multiple bacterial morphotypes.  Code Status: Full. Family Communication: Brother Lodema Hong [phone]) updated at bedside. Disposition Plan: Home when stable.   Medical Consultants:  None.  Other Consultants:  None.  Anti-infectives:  Rocephin 12/13/2012---> 12/15/2012  HPI/Subjective: Alexis Reese seems anxious, complains "I don't feel well "but cannot specify further how this is so. There has been no nausea or vomiting.  Objective: Filed Vitals:   12/14/12 1400 12/14/12 2100 12/14/12 2142 12/15/12 0605  BP: 158/86  149/72 158/85  Pulse: 86 78 87 82  Temp: 97.7 F (36.5 C)  98.3 F (36.8 C) 98 F (36.7  C)  TempSrc: Oral  Oral Oral  Resp: 18  20 20   Height:      Weight:      SpO2: 100%  96% 98%    Intake/Output Summary (Last 24 hours) at 12/15/12 1200 Last data filed at 12/15/12 0802  Gross per 24 hour  Intake 3305.7 ml  Output    700 ml  Net 2605.7 ml    Exam: Gen:  NAD Cardiovascular:  RRR, No M/R/G Respiratory:  Lungs CTAB Gastrointestinal:  Abdomen soft, NT/ND, + BS Extremities:  No C/E/C  Data Reviewed: Basic Metabolic Panel:  Recent Labs Lab 12/13/12 1939 12/14/12 0645 12/15/12 0345  NA 126* 127* 131*  K 4.4 4.1 4.1  CL 88* 92* 95*  CO2 30 28 29   GLUCOSE 126* 128* 126*  BUN 7 8 7   CREATININE 0.72 0.60 0.67  CALCIUM 9.7 8.9 8.8   GFR Estimated Creatinine Clearance: 60 ml/min (by C-G formula based on Cr of 0.67).  CBC:  Recent Labs Lab 12/13/12 1939 12/14/12 0645  WBC 5.7 6.0  NEUTROABS 4.1  --   HGB 15.6* 13.6  HCT 44.3 38.2  MCV 87.7 88.2  PLT 170 172   CBG:  Recent Labs Lab 12/14/12 1234 12/14/12 1724 12/14/12 2053 12/15/12 0726 12/15/12 1147  GLUCAP 156* 152* 152* 142* 163*   Thyroid function studies  Recent Labs  12/14/12 0645  TSH 0.952   Microbiology Recent Results (from the past 240 hour(s))  URINE CULTURE     Status: None   Collection Time    12/13/12  9:35 PM      Result Value Range Status   Specimen Description URINE, CLEAN CATCH   Final   Special Requests NONE   Final   Culture  Setup Time     Final   Value: 12/14/2012 09:32     Performed at [facility] Count     Final   Value: 80,000 COLONIES/ML     Performed at [facility]   Culture     Final   Value: Multiple bacterial morphotypes present, none predominant. Suggest appropriate recollection if clinically indicated.     Performed at [facility]   Report Status 12/15/2012 FINAL   Final     Procedures and Diagnostic Studies: None.  Scheduled Meds: . atorvastatin  10 mg Oral q1800  . benazepril  20 mg Oral BID  .  cefTRIAXone (ROCEPHIN)  IV  1 g Intravenous QHS  . feeding supplement  237 mL Oral Q1500  . heparin  5,000 Units Subcutaneous Q8H  . influenza vac split quadrivalent PF  0.5 mL Intramuscular Tomorrow-1000  . insulin aspart  0-15 Units Subcutaneous TID WC  . levothyroxine  125 mcg Oral QAC breakfast  . PARoxetine  40 mg Oral QHS  . polyethylene glycol  17 g Oral Daily  . QUEtiapine  50 mg Oral QHS   Continuous Infusions: . sodium chloride 100 mL/hr at 12/15/12 1130    Time spent: 25 minutes.   LOS: 2 days   RAMA,CHRISTINA  [facility] Pager [phone].   *Please note that the hospitalists switch teams on Wednesdays. Please call the flow manager at [phone] if you are having difficulty reaching the hospitalist taking care of this patient as she can update you and provide the most up-to-date pager number of provider caring for the patient. If 8PM-8AM, please contact night-coverage at [email or web address], password [facility]  12/15/2012, 12:00 PM

## 2012-12-15 NOTE — Evaluation (Signed)
Physical Therapy Evaluation Patient Details Name: Alexis Reese MRN: [id number] DOB: [birth year] Today's Date: 12/15/2012 Time: 8657-8469 PT Time Calculation (min): 30 min  PT Assessment / Plan / Recommendation History of Present Illness  Alexis Reese is an 78 y.o. female with a PMH of diabetes, hypertension, mild dementia, depression and anxiety was admitted on 12/13/2012 with increased confusion, lower abdominal pain and decreased by mouth intake. Upon initial evaluation emergency department, the patient was noted to have a sodium of 126 and evidence of a UTI with leukocytes in her urine.  Clinical Impression  Pt demonstrates anxiety and deconditioning, but was able to perform functional mobility.  She would benefit from continued PT at discharge if she will agree to decrease burden of care for family at home    PT Assessment  Patient needs continued PT services    Follow Up Recommendations  SNF (HHPT if pt does not agree to SNF)    Does the patient have the potential to tolerate intense rehabilitation      Barriers to Discharge        Equipment Recommendations  None recommended by PT    Recommendations for Other Services     Frequency Min 3X/week    Precautions / Restrictions     Pertinent Vitals/Pain Pt c/o pain in left lower abdomen      Mobility  Bed Mobility Bed Mobility: Rolling Right;Rolling Left;Supine to Sit Rolling Right: 3: Mod assist;With rail Rolling Left: 3: Mod assist;With rail Supine to Sit: 3: Mod assist;HOB elevated Details for Bed Mobility Assistance: assisted upper body into upright. Scooted to edge of bed on pad Transfers Transfers: Sit to Stand;Stand to Sit Sit to Stand: 3: Mod assist Stand to Sit: 3: Mod assist Details for Transfer Assistance: needed assist to lift hips off bed Ambulation/Gait Ambulation/Gait Assistance: 3: Mod assist Ambulation Distance (Feet): 6 Feet Assistive device: Rolling walker Ambulation/Gait Assistance Details:   Shoes applied to pt to help with walking. Pt needed assist to provide encouragement to pt and to guide RW Gait Pattern: Decreased step length - right;Decreased step length - left;Trunk flexed;Lateral trunk lean to right;Narrow base of support;Decreased stride length;Shuffle Gait velocity: decreased General Gait Details: Gait is limited by deconditioning and poor posture. Pt also self limits her distance and would not attempt walking a second time despite max encouagement.  She gave many excuses why she could not walk.  Nursing informed and checking if further medicine is available Stairs: No Wheelchair Mobility Wheelchair Mobility: No    Exercises General Exercises - Lower Extremity Ankle Circles/Pumps: AROM;Both;5 reps;Supine Hip Flexion/Marching: AROM;Both;10 reps;AAROM;Supine   PT Diagnosis: Difficulty walking;Abnormality of gait;Generalized weakness;Acute pain  PT Problem List: Decreased strength;Decreased activity tolerance;Decreased balance;Decreased mobility;Pain;Decreased range of motion PT Treatment Interventions: Gait training;Functional mobility training;Therapeutic activities;Therapeutic exercise;Stair training;Balance training     PT Goals(Current goals can be found in the care plan section) Acute Rehab PT Goals Patient Stated Goal: to go home PT Goal Formulation: With patient/family Time For Goal Achievement: 12/29/12 Potential to Achieve Goals: Fair  Visit Information  Last PT Received On: 12/15/12 Assistance Needed: +2 History of Present Illness: Alexis Reese is an 78 y.o. female with a PMH of diabetes, hypertension, mild dementia, depression and anxiety was admitted on 12/13/2012 with increased confusion, lower abdominal pain and decreased by mouth intake. Upon initial evaluation emergency department, the patient was noted to have a sodium of 126 and evidence of a UTI with leukocytes in her urine.  Prior Functioning  Home Living Family/patient expects to be  discharged to:: Private residence Living Arrangements: Other relatives;Children (son and brother) Available Help at Discharge: Family Type of Home: House Home Access: Stairs to enter Secretary/administrator of Steps: 2 Entrance Stairs-Rails: Right Home Layout: One level Home Equipment: Environmental consultant - 4 wheels Prior Function Level of Independence: Needs assistance Gait / Transfers Assistance Needed: brother reports pt was using a BSC at her bedside and scooting around in the house sitting on the seat of her rollator Brother states she keeps upper body bent over to right side from previous back surgery Communication Communication: No difficulties    Cognition  Cognition Arousal/Alertness: Awake/alert Behavior During Therapy: Anxious (pt tearful, multiple c/o"can't eat" "can't walk" "so sore") Overall Cognitive Status: History of cognitive impairments - at baseline    Extremity/Trunk Assessment Lower Extremity Assessment Lower Extremity Assessment: Generalized weakness Cervical / Trunk Assessment Cervical / Trunk Assessment: Kyphotic;Other exceptions Cervical / Trunk Exceptions: side bending to right, diffuse muscle atrophy   Balance Balance Balance Assessed: Yes Static Sitting Balance Static Sitting - Balance Support: Bilateral upper extremity supported;Feet supported;Feet unsupported Static Sitting - Level of Assistance: 5: Stand by assistance Static Sitting - Comment/# of Minutes: > 5 min Static Standing Balance Static Standing - Balance Support: Bilateral upper extremity supported;During functional activity Static Standing - Comment/# of Minutes: pt unable to stand erect  End of Session PT - End of Session Equipment Utilized During Treatment: Gait belt Activity Tolerance: Patient limited by pain Patient left: in chair;with family/visitor present;with call bell/phone within reach Nurse Communication: Mobility status;Patient requests pain meds  GP    Bayard Hugger. [place], [place] [phone]   12/15/2012, 2:46 PM

## 2012-12-16 DIAGNOSIS — F411 Generalized anxiety disorder: Secondary | ICD-10-CM

## 2012-12-16 LAB — BASIC METABOLIC PANEL
Calcium: 9.1 mg/dL (ref 8.4–10.5)
Creatinine, Ser: 0.55 mg/dL (ref 0.50–1.10)
GFR calc Af Amer: >90 mL/min (ref >90)
GFR calc non Af Amer: 89 mL/min — ABNORMAL LOW (ref >90)

## 2012-12-16 LAB — GLUCOSE, CAPILLARY: Glucose-Capillary: 166 mg/dL — ABNORMAL HIGH (ref 70–99)

## 2012-12-16 MED ORDER — MAGNESIUM HYDROXIDE 400 MG/5ML PO SUSP: 30.0000 mL | Freq: Every day | ORAL | Status: DC | PRN

## 2012-12-16 MED ORDER — CLONAZEPAM 0.5 MG PO TABS: 0.5000 mg | ORAL_TABLET | Freq: Three times a day (TID) | ORAL | Status: DC

## 2012-12-16 MED ORDER — PAROXETINE HCL 40 MG PO TABS: 40.0000 mg | ORAL_TABLET | Freq: Every day | ORAL | Status: DC

## 2012-12-16 MED ORDER — HYDROCODONE-ACETAMINOPHEN 5-325 MG PO TABS
1.0000 | ORAL_TABLET | Freq: Once | ORAL | Status: AC
  Administered 2012-12-16: 1 via ORAL

## 2012-12-16 NOTE — Progress Notes (Signed)
Physical Therapy Treatment Patient Details Name: Alexis Reese MRN: [id number] DOB: [birth year] Today's Date: 12/16/2012 Time: 4098-1191 PT Time Calculation (min): 17 min  PT Assessment / Plan / Recommendation  History of Present Illness Alexis Reese is an 78 y.o. female with a PMH of diabetes, hypertension, mild dementia, depression and anxiety was admitted on 12/13/2012 with increased confusion, lower abdominal pain and decreased by mouth intake. Upon initial evaluation emergency department, the patient was noted to have a sodium of 126 and evidence of a UTI with leukocytes in her urine.   PT Comments   Pt need to void and required MAX encouragement to get OOB to [facility].  Pt wanted to use bed pan.  Advised her we could use getting OOB to [facility] as part of her PT session.  Pt demon MAX anxiety and repeated "what do I do?" in a near panic.  Pt very distraught.  Required MAX positive reinforcement to participate.  Pt also repeated, "I can't walk".  Was able to amb her 45 feet however pt's anxiety limited her progression.   Follow Up Recommendations  SNF     Does the patient have the potential to tolerate intense rehabilitation     Barriers to Discharge        Equipment Recommendations       Recommendations for Other Services    Frequency Min 3X/week   Progress towards PT Goals Progress towards PT goals: Progressing toward goals  Plan      Precautions / Restrictions Precautions Precaution Comments: anxiety Restrictions Weight Bearing Restrictions: No    Pertinent Vitals/Pain No c/o pain    Mobility  Bed Mobility Bed Mobility: Supine to Sit Supine to Sit: 3: Mod assist Details for Bed Mobility Assistance: MAX encouragement to increase self assist. Transfers Transfers: Sit to Stand;Stand to Sit Sit to Stand: 3: Mod assist;4: Min assist;From bed;From chair/3-in-1 Stand to Sit: 4: Min assist;3: Mod assist;To chair/3-in-1;To toilet Details for Transfer Assistance: 50% VC's on proper  hand placement and MAX encouragement to participate. Ambulation/Gait Ambulation/Gait Assistance: 3: Mod assist Ambulation Distance (Feet): 45 Feet Assistive device: Rolling walker Ambulation/Gait Assistance Details: MAX encouragement to participate.  Pt repeats "I can't walk'. Pt also exhibits MAX anxiety and requires MAX positive reinforcement to paticipate.  #rd assist followed with chair for safety.  Gait Pattern: Decreased step length - right;Decreased step length - left;Trunk flexed;Lateral trunk lean to right;Narrow base of support;Decreased stride length;Shuffle Gait velocity: decreased     PT Goals (current goals can now be found in the care plan section)    Visit Information  Last PT Received On: 12/16/12 Assistance Needed: +2 History of Present Illness: Alexis Reese is an 78 y.o. female with a PMH of diabetes, hypertension, mild dementia, depression and anxiety was admitted on 12/13/2012 with increased confusion, lower abdominal pain and decreased by mouth intake. Upon initial evaluation emergency department, the patient was noted to have a sodium of 126 and evidence of a UTI with leukocytes in her urine.    Subjective Data      Cognition       Balance     End of Session PT - End of Session Equipment Utilized During Treatment: Gait belt Activity Tolerance:  (limited by anxiety) Patient left: in chair;with call bell/phone within reach Nurse Communication: Mobility status   Felecia Shelling  PTA [facility]  Acute  Rehab Pager      [phone]

## 2012-12-16 NOTE — Progress Notes (Signed)
Clinical Social Work Department BRIEF PSYCHOSOCIAL ASSESSMENT 12/16/2012  Patient:  Alexis Reese, Alexis Reese     Account Number:  [id number]     Admit date:  12/13/2012  Clinical Social Worker:  Jacelyn Grip  Date/Time:  12/16/2012 10:00 AM  Referred by:  CSW  Date Referred:  12/16/2012 Referred for  SNF Placement   Other Referral:   Chart review indicated PT recommendation for SNF   Interview type:  Other - See comment Other interview type:   collaboration with MD and RNCM    PSYCHOSOCIAL DATA Living Status:  SIBLING Admitted from facility:   Level of care:   Primary support name:  Alexis Reese/[phone] Primary support relationship to patient:  SIBLING Degree of support available:   adequate    CURRENT CONCERNS Current Concerns  Post-Acute Placement   Other Concerns:    SOCIAL WORK ASSESSMENT / PLAN CSW reviewed chart and noted that PT recommended SNF or HH PT if pt refused SNF.    CSW went to pt room, however, MD present at bedside at this time.    CSW had discussion with MD who stated that pt is experiencing significant anxiety and RNCM plans to communicate with pt brother re: disposition planning as pt provided permission to do so.    CSW spoke with RNCM who was able to have conversation with pt brother, whom pt lives with. Per RNCM, pt brother would like for pt to return home with [facility] services including a social workers and if pt brother is unable to manage pt care needs at home then pt and pt family would explore SNF options.    Per MD, pt reported that she had been at SNF before and did not feel that it was a benefit.    Plan is for pt to return home with brother and [facility] services.    CSW was notified that pt will require ambulance transportation at discharge. CSW to assist.    CSW to continue to follow and assit with ambulance transportation home at d/c.   Assessment/plan status:  Psychosocial Support/Ongoing Assessment of Needs Other assessment/  plan:   Information/referral to community resources:   RNCM following for [facility] needs.    PATIENT'S/FAMILY'S RESPONSE TO PLAN OF CARE: Pt alert and oriented x 4, but displays severe anxiety. Pt and pt brother are not agreeable to rehab at this time and plan for pt to return home with [facility] services and 24 hour care from pt brother.       Jacklynn Lewis, MSW, LCSWA  Clinical Social Work [phone]

## 2012-12-16 NOTE — Progress Notes (Signed)
[facility]  [facility] is providing the following services: Wheelchair and Commode  If patient discharges after hours, please call [phone].   Renard Hamper 12/16/2012, 10:24 AM

## 2012-12-16 NOTE — Discharge Summary (Signed)
Physician Discharge Summary  Alexis Reese GEX:[id number] DOB: October 19, 1934 DOA: 12/13/2012  PCP: Josue Hector, MD  Admit date: 12/13/2012 Discharge date: 12/16/2012  Recommendations for Outpatient Follow-up:  1. Psychotropic medication adjustment made with input from Dr. Lolly Mustache (psychiatrist).  F/U appointment made with Dr. Tenny Craw of psychiatry on 12/28/12. 2. Avoid duplicating neuroleptics/SSRI therapy secondary to hyponatremia/polypharmacy.   3. Home health PT/OT/RN/aide/SW set up.  Discharge Diagnoses:  Principal Problem:    Metabolic encephalopathy secondary to hyponatremia and polypharmacy Active Problems:    Depression with anxiety    DM type 2 (diabetes mellitus, type 2)    HTN (hypertension)    UTI (lower urinary tract infection)    Discharge Condition: Stable.  Diet recommendation: Low sodium, heart healthy.  History of present illness:  Alexis Reese is an 78 y.o. female with a PMH of diabetes, hypertension, mild dementia, depression and anxiety was admitted on 12/13/2012 with increased confusion, lower abdominal pain and decreased by mouth intake. Upon initial evaluation emergency department, the patient was noted to have a sodium of 126 and evidence of a UTI with leukocytes in her urine. Urine cultures were negative, and the patient's symptoms have been attributed to a combination of polypharmacy, metabolic encephalopathy from hyponatremia and severe anxiety. Psychiatry consultation has been requested.  Hospital Course by problem:  Principal Problem:  Metabolic encephalopathy secondary to hyponatremia and polypharmacy  -Appears to be related to SSRI therapy. Sodium improved with discontinuation of Cymbalta and Pamelor. Paxil dose reduced to 40 mg daily. -Risperdal discontinued (already on Seroquel).  -PT evaluation performed. Refuses rehabilitation. Home health PT/OT/RN/aide and social worker set up for transition to home.  Active Problems:  Depression with  anxiety  -On multiple psychotropic medications including Paxil, Seroquel, Seroquel and Cymbalta. Discontinued Cymbalta and Risperdal. Paxil dose being weaned down. Continue Klonopin. -Evaluated by psychiatrist, outpatient psychiatry follow up arranged. -TSH within normal limits.   DM type 2 (diabetes mellitus, type 2)  -Appears to be diet controlled.  Treated with SSI while in the hospital. HTN (hypertension)  -Continue Lotensin.  UTI (lower urinary tract infection)  -Treated with empiric Rocephin until culture back. Urine cultures grew 80,000 colonies of multiple bacterial morphotypes.   Procedures:  None.  Consultations:  Dr. Kathryne Sharper, Psychiatry  Discharge Exam: Filed Vitals:   12/16/12 1355  BP: 144/79  Pulse: 94  Temp: 97.8 F (36.6 C)  Resp: 16   Filed Vitals:   12/15/12 2050 12/16/12 0614 12/16/12 0942 12/16/12 1355  BP: 152/79 156/88 145/89 144/79  Pulse: 85 74 90 94  Temp: 98.1 F (36.7 C) 97.8 F (36.6 C)  97.8 F (36.6 C)  TempSrc: Oral Oral  Oral  Resp: 18 16  16   Height:      Weight:      SpO2: 100% 97%  100%    Gen:  Anxious Cardiovascular:  RRR, No M/R/G Respiratory: Lungs CTAB Gastrointestinal: Abdomen soft, NT/ND with normal active bowel sounds. Extremities: No C/E/C   Discharge Instructions  Discharge Orders   Future Orders Complete By Expires   Call MD for:  extreme fatigue  As directed    Call MD for:  temperature >100.4  As directed    Call MD for:  As directed    Scheduling Instructions:     Excessive anxiety   Diet - low sodium heart healthy  As directed    Diet Carb Modified  As directed    Discharge instructions  As directed    Comments:  Your medications have been adjusted with Dr. Sheela Stack input (psychiatrist).  We recommend you keep your follow up appointment with Dr. Tenny Craw (a psychiatrist) for further adjustment of your medications given your severe anxiety.  An appointment has been made for you on Wednesday, September  24th at 3:15 p.m.  Please call the office if you are unable to keep your appointment time.  You were cared for by Dr. Hillery Aldo  (a hospitalist) during your hospital stay. If you have any questions about your discharge medications or the care you received while you were in the hospital after you are discharged, you can call the unit and ask to speak with the hospitalist on call if the hospitalist that took care of you is not available. Once you are discharged, your primary care physician will handle any further medical issues. Please note that NO REFILLS for any discharge medications will be authorized once you are discharged, as it is imperative that you return to your primary care physician (or establish a relationship with a primary care physician if you do not have one) for your aftercare needs so that they can reassess your need for medications and monitor your lab values.  Any outstanding tests can be reviewed by your PCP at your follow up visit.  It is also important to review any medicine changes with your PCP.  Please bring these d/c instructions with you to your next visit so your physician can review these changes with you.  If you do not have a primary care physician, you can call [phone] for a physician referral.  It is highly recommended that you obtain a PCP for hospital follow up.   Increase activity slowly  As directed        Medication List    STOP taking these medications       DULoxetine 30 MG capsule  Commonly known as:  CYMBALTA     HYDROcodone-acetaminophen 5-325 MG per tablet  Commonly known as:  NORCO/VICODIN     nortriptyline 10 MG capsule  Commonly known as:  PAMELOR     risperiDONE 0.25 MG tablet  Commonly known as:  RISPERDAL      TAKE these medications       benazepril 20 MG tablet  Commonly known as:  LOTENSIN  Take 20 mg by mouth 2 (two) times daily.     calcitonin (salmon) 200 UNIT/ACT nasal spray  Commonly known as:  MIACALCIN/FORTICAL  Place  1 spray into the nose daily. Alternating sids of  Nose daily     clonazePAM 0.5 MG tablet  Commonly known as:  KLONOPIN  Take 1 tablet (0.5 mg total) by mouth 3 (three) times daily as needed for anxiety.     cyanocobalamin 1000 MCG/ML injection  Commonly known as:  (VITAMIN B-12)  Inject 1,000 mcg into the skin every 30 (thirty) days.     fluticasone 50 MCG/ACT nasal spray  Commonly known as:  FLONASE  Place 2 sprays into the nose every morning.     levothyroxine 125 MCG tablet  Commonly known as:  SYNTHROID, LEVOTHROID  Take 125 mcg by mouth daily before breakfast.     LIVALO 2 MG Tabs  Generic drug:  Pitavastatin Calcium  Take 1 mg by mouth every morning.     loratadine 10 MG tablet  Commonly known as:  CLARITIN  Take 10 mg by mouth every morning.     PARoxetine 30 MG tablet  Commonly known as:  PAXIL  Take 60 mg  by mouth at bedtime.     QUEtiapine 50 MG tablet  Commonly known as:  SEROQUEL  Take 50 mg by mouth at bedtime.     Vitamin D (Ergocalciferol) 50000 UNITS Caps capsule  Commonly known as:  DRISDOL  Take 50,000 Units by mouth every 7 (seven) days. Taken on Thursdays           Follow-up Information   Follow up with Diannia Ruder, MD. (Wednesday, 12/28/12 at 3:15 p.m.)    Specialty:  [facility] information:   [address] [place] [address] [phone]       Follow up with Josue Hector, MD. Schedule an appointment as soon as possible for a visit in 2 weeks.   Specialty:  Family Medicine   Contact information:   [address] [place] [address] [phone]        The results of significant diagnostics from this hospitalization (including imaging, microbiology, ancillary and laboratory) are listed below for reference.    Significant Diagnostic Studies: Dg Lumbar Spine Complete  12/05/2012   *RADIOLOGY REPORT*  Clinical Data: Back pain.  No known injury.  LUMBAR SPINE - COMPLETE 4+ VIEW   Comparison: CT abdomen pelvis 01/03/2011  Findings:  Decreased bony mineralization.  There is stable grade III anterolisthesis of L5-1 S1, in this patient with known bilateral pars defect at L5. Stable grade 1 retrolisthesis of L2 on L3.  Severe compression deformity of T12 is stable.  L1 vertebral body height is preserved.  Stable moderate compression fracture of L2. L3 vertebral body height is preserved.  Stable mild to moderate superior endplate compression fracture of L4.  L5 vertebral body appears stable, with mild loss of the inferior endplate height. Mild convex left scoliosis.  No acute fracture is identified.  IMPRESSION: 1. The patient has multiple thoracolumbar compression fractures. These fractures appear stable compared to the CT of 01/03/2011.  No acute fracture deformities are identified. 2.  Stable grade III anterolisthesis of L5 on S1 and stable grade 1 retrolisthesis of L2 on L3.   Original Report Authenticated By: Britta Mccreedy, M.D.   Dg Pelvis 1-2 Views  12/05/2012   *RADIOLOGY REPORT*  Clinical Data: Pain.  No known injury.  PELVIS - 1-2 VIEW  Comparison: CT abdomen pelvis 01/03/2011  Findings: The bones appear osteopenic.  The pelvic ring appears intact.  Small sclerotic area in the intertrochanteric region of the right femur appears stable compared to a CT of September 2012 and is likely a benign enchondroma.  No acute fracture is identified.  Mild degenerative changes of the pubic symphysis.  IMPRESSION: Decreased bony mineralization.  No acute bony abnormality is identified.  If there is clinical concern for radiographically occult fracture, pelvic MRI or CT could be considered for further evaluation.   Original Report Authenticated By: Britta Mccreedy, M.D.    Labs:  Basic Metabolic Panel:  Recent Labs Lab 12/13/12 1939 12/14/12 0645 12/15/12 0345 12/16/12 0350  NA 126* 127* 131* 132*  K 4.4 4.1 4.1 3.8  CL 88* 92* 95* 97  CO2 30 28 29 29   GLUCOSE 126* 128* 126* 124*  BUN 7  8 7  5*  CREATININE 0.72 0.60 0.67 0.55  CALCIUM 9.7 8.9 8.8 9.1   GFR Estimated Creatinine Clearance: 60 ml/min (by C-G formula based on Cr of 0.55).  CBC:  Recent Labs Lab 12/13/12 1939 12/14/12 0645  WBC 5.7 6.0  NEUTROABS 4.1  --   HGB 15.6* 13.6  HCT 44.3 38.2  MCV 87.7 88.2  PLT 170 172   CBG:  Recent Labs Lab 12/15/12 1147 12/15/12 1705 12/15/12 2202 12/16/12 0803 12/16/12 1300  GLUCAP 163* 151* 178* 166* 177*   Thyroid function studies  Recent Labs  12/14/12 0645  TSH 0.952   Anemia work up  Recent Labs  12/14/12 0645  VITAMINB12 638   Microbiology Recent Results (from the past 240 hour(s))  URINE CULTURE     Status: None   Collection Time    12/13/12  9:35 PM      Result Value Range Status   Specimen Description URINE, CLEAN CATCH   Final   Special Requests NONE   Final   Culture  Setup Time     Final   Value: 12/14/2012 09:32     Performed at [facility] Count     Final   Value: 80,000 COLONIES/ML     Performed at [facility]   Culture     Final   Value: Multiple bacterial morphotypes present, none predominant. Suggest appropriate recollection if clinically indicated.     Performed at [facility]   Report Status 12/15/2012 FINAL   Final    Time coordinating discharge: 35 minutes.  Signed:  Asmara Backs  Pager [phone] [facility] 12/16/2012, 3:07 PM

## 2012-12-16 NOTE — Consult Note (Signed)
Alexis Reese Face-to-Face Psychiatry Consult   Reason for Consult:  Polypharmacy and anxiety. Referring Physician:  Dr Noel Journey is an 78 y.o. female.  Assessment: AXIS I:  Generalized Anxiety Disorder AXIS II:  Deferred AXIS III:   Past Medical History  Diagnosis Date  . Cancer   . Hypertension   . Diabetes mellitus   . GERD (gastroesophageal reflux disease)    AXIS IV:  other psychosocial or environmental problems and problems with primary support group AXIS V:  51-60 moderate symptoms  Plan:  No evidence of imminent risk to self or others at present.   Patient does not meet criteria for psychiatric inpatient admission. Supportive therapy provided about ongoing stressors. Discussed crisis plan, support from social network, calling 911, coming to the Emergency Department, and calling Suicide Hotline.  Subjective:   Alexis Reese is a 78 y.o. female patient admitted with change mental status.  HPI:  The patient is a 78 year old Caucasian unemployed female who was admitted to do confusion including mental status.  She was confused and having low abdominal pain.  At admission her sodium was 126 and evidence of UTI.  Consult was called because patient is taking multiple psychotropic medication which could be due to psychotropic medication.  She was taking Cymbalta, Paxil, Seroquel and Risperdal.  Her physician discontinued Cymbalta , lower the Paxil and discontinued risperidone.  Patient seen and chart reviewed.  Patient remains very anxious and nervous.  Patient is known to this Clinical research associate from outpatient psychiatric medication management few years ago.   Patient is seen physician assistant and getting multiple psychotropic medication.  It is unclear why she is taking 2 antidepressants and antipsychotic medication.  Patient does not have any psychosis or paranoia but admitted long history of depression and anxiety symptoms.  Patient to remember used to take low-dose benzodiazepine however  she denies any recent use of benzodiazepine or prescribed by primary care physician.  Patient denies any hallucination at this time.  She denies any active or passive suicidal thoughts or homicidal thoughts.  Patient remained sometime in a confused nervous and had some memory impairment.  However she is feeling much better since she admitted to the hospital.  The patient do not remember her current provider who is prescribing psychotropic medication.  Her sodium is also improved from the past.  Patient denies any anhedonia , hopelessness or any suicidal thinking but continues to have nervousness anxiety and sometimes panic attack.  Patient did remember having surgery a few years ago and her symptoms are worse since then.  Patient denies any aggression violence  HPI Elements:   Location:  Medical floor. Quality:  Fair fair. Severity:  Moderate. Timing:  2 days ago. Duration:  Ongoing. Context:  Most likely related to polypharmacy.  Past Psychiatric History: Past Medical History  Diagnosis Date  . Cancer   . Hypertension   . Diabetes mellitus   . GERD (gastroesophageal reflux disease)     reports that she has never smoked. She has never used smokeless tobacco. She reports that she does not drink alcohol or use illicit drugs. History reviewed. No pertinent family history.   Living Arrangements: Other relatives;Children (son and brother)   Abuse/Neglect [facility]) Physical Abuse: Denies Verbal Abuse: Denies Sexual Abuse: Denies Allergies:  No Known Allergies  ACT Assessment Complete:  No:   Past Psychiatric History: Diagnosis:  Generalized anxiety disorder   Hospitalizations:  Denies   Outpatient Care:  Yes , used to be seen by a  psychiatrist in [place] office.  Substance Abuse Care:  Denies   Self-Mutilation:  Denies   Suicidal Attempts:  Denies   Homicidal Behaviors:  Denies    Violent Behaviors:  Denies    Place of Residence:  Lives with her brother and son Marital Status:   Single Employed/Unemployed:  Unemployed Education:  Questionable high school Family Supports:  Yes Objective: Blood pressure 144/79, pulse 94, temperature 97.8 F (36.6 C), temperature source Oral, resp. rate 16, height 5\' 2"  (1.575 m), weight 184 lb 8.4 oz (83.7 kg), SpO2 100.00%.Body mass index is 33.74 kg/(m^2). Results for orders placed during the hospital encounter of 12/13/12 (from the past 72 hour(s))  CBC WITH DIFFERENTIAL     Status: Abnormal   Collection Time    12/13/12  7:39 PM      Result Value Range   WBC 5.7  4.0 - 10.5 K/uL   RBC 5.05  3.87 - 5.11 MIL/uL   Hemoglobin 15.6 (*) 12.0 - 15.0 g/dL   HCT 16.1  09.6 - 04.5 %   MCV 87.7  78.0 - 100.0 fL   MCH 30.9  26.0 - 34.0 pg   MCHC 35.2  30.0 - 36.0 g/dL   RDW 40.9  81.1 - 91.4 %   Platelets 170  150 - 400 K/uL   Neutrophils Relative % 72  43 - 77 %   Neutro Abs 4.1  1.7 - 7.7 K/uL   Lymphocytes Relative 18  12 - 46 %   Lymphs Abs 1.0  0.7 - 4.0 K/uL   Monocytes Relative 9  3 - 12 %   Monocytes Absolute 0.5  0.1 - 1.0 K/uL   Eosinophils Relative 1  0 - 5 %   Eosinophils Absolute 0.1  0.0 - 0.7 K/uL   Basophils Relative 1  0 - 1 %   Basophils Absolute 0.0  0.0 - 0.1 K/uL  BASIC METABOLIC PANEL     Status: Abnormal   Collection Time    12/13/12  7:39 PM      Result Value Range   Sodium 126 (*) 135 - 145 mEq/L   Potassium 4.4  3.5 - 5.1 mEq/L   Comment: NO VISIBLE HEMOLYSIS   Chloride 88 (*) 96 - 112 mEq/L   CO2 30  19 - 32 mEq/L   Glucose, Bld 126 (*) 70 - 99 mg/dL   BUN 7  6 - 23 mg/dL   Creatinine, Ser 7.82  0.50 - 1.10 mg/dL   Calcium 9.7  8.4 - 95.6 mg/dL   GFR calc non Af Amer 81 (*) >90 mL/min   GFR calc Af Amer >90  >90 mL/min   Comment: (NOTE)     The eGFR has been calculated using the CKD EPI equation.     This calculation has not been validated in all clinical situations.     eGFR's persistently <90 mL/min signify possible Chronic Kidney     Disease.  CG4 I-STAT (LACTIC ACID)     Status: None    Collection Time    12/13/12  8:37 PM      Result Value Range   Lactic Acid, Venous 1.53  0.5 - 2.2 mmol/L  URINALYSIS, ROUTINE W REFLEX MICROSCOPIC     Status: Abnormal   Collection Time    12/13/12  9:35 PM      Result Value Range   Color, Urine YELLOW  YELLOW   APPearance CLEAR  CLEAR   Specific Gravity, Urine 1.016  1.005 -  1.030   pH 7.0  5.0 - 8.0   Glucose, UA NEGATIVE  NEGATIVE mg/dL   Hgb urine dipstick NEGATIVE  NEGATIVE   Bilirubin Urine NEGATIVE  NEGATIVE   Ketones, ur NEGATIVE  NEGATIVE mg/dL   Protein, ur NEGATIVE  NEGATIVE mg/dL   Urobilinogen, UA 0.2  0.0 - 1.0 mg/dL   Nitrite NEGATIVE  NEGATIVE   Leukocytes, UA SMALL (*) NEGATIVE  URINE MICROSCOPIC-ADD ON     Status: None   Collection Time    12/13/12  9:35 PM      Result Value Range   Squamous Epithelial / LPF RARE  RARE   WBC, UA 3-6  <3 WBC/hpf   RBC / HPF 0-2  <3 RBC/hpf  URINE CULTURE     Status: None   Collection Time    12/13/12  9:35 PM      Result Value Range   Specimen Description URINE, CLEAN CATCH     Special Requests NONE     Culture  Setup Time       Value: 12/14/2012 09:32     Performed at [facility] Count       Value: 80,000 COLONIES/ML     Performed at [facility]   Culture       Value: Multiple bacterial morphotypes present, none predominant. Suggest appropriate recollection if clinically indicated.     Performed at [facility]   Report Status 12/15/2012 FINAL    GLUCOSE, CAPILLARY     Status: Abnormal   Collection Time    12/14/12  1:41 AM      Result Value Range   Glucose-Capillary 121 (*) 70 - 99 mg/dL   Comment 1 Notify RN    BASIC METABOLIC PANEL     Status: Abnormal   Collection Time    12/14/12  6:45 AM      Result Value Range   Sodium 127 (*) 135 - 145 mEq/L   Potassium 4.1  3.5 - 5.1 mEq/L   Comment: SLIGHT HEMOLYSIS     HEMOLYSIS AT THIS LEVEL MAY AFFECT RESULT   Chloride 92 (*) 96 - 112 mEq/L   CO2 28  19 - 32 mEq/L   Glucose,  Bld 128 (*) 70 - 99 mg/dL   BUN 8  6 - 23 mg/dL   Creatinine, Ser 6.21  0.50 - 1.10 mg/dL   Calcium 8.9  8.4 - 30.8 mg/dL   GFR calc non Af Amer 86 (*) >90 mL/min   GFR calc Af Amer >90  >90 mL/min   Comment: (NOTE)     The eGFR has been calculated using the CKD EPI equation.     This calculation has not been validated in all clinical situations.     eGFR's persistently <90 mL/min signify possible Chronic Kidney     Disease.  CBC     Status: None   Collection Time    12/14/12  6:45 AM      Result Value Range   WBC 6.0  4.0 - 10.5 K/uL   Comment: WHITE COUNT CONFIRMED ON SMEAR   RBC 4.33  3.87 - 5.11 MIL/uL   Hemoglobin 13.6  12.0 - 15.0 g/dL   HCT 65.7  84.6 - 96.2 %   MCV 88.2  78.0 - 100.0 fL   MCH 31.4  26.0 - 34.0 pg   MCHC 35.6  30.0 - 36.0 g/dL   RDW 95.2  84.1 - 32.4 %   Platelets  172  150 - 400 K/uL  VITAMIN B12     Status: None   Collection Time    12/14/12  6:45 AM      Result Value Range   Vitamin B-12 638  211 - 911 pg/mL   Comment: Performed at [facility]  TSH     Status: None   Collection Time    12/14/12  6:45 AM      Result Value Range   TSH 0.952  0.350 - 4.500 uIU/mL   Comment: Performed at [facility]  OSMOLALITY     Status: Abnormal   Collection Time    12/14/12  6:45 AM      Result Value Range   Osmolality 267 (*) 275 - 300 mOsm/kg   Comment: Performed at [facility]  GLUCOSE, CAPILLARY     Status: Abnormal   Collection Time    12/14/12  7:50 AM      Result Value Range   Glucose-Capillary 120 (*) 70 - 99 mg/dL   Comment 1 Documented in Chart     Comment 2 Notify RN    GLUCOSE, CAPILLARY     Status: Abnormal   Collection Time    12/14/12 12:34 PM      Result Value Range   Glucose-Capillary 156 (*) 70 - 99 mg/dL   Comment 1 Documented in Chart     Comment 2 Notify RN    SODIUM, URINE, RANDOM     Status: None   Collection Time    12/14/12  4:29 PM      Result Value Range   Sodium, Ur 53     Comment: Performed  at [facility]  OSMOLALITY, URINE     Status: Abnormal   Collection Time    12/14/12  4:29 PM      Result Value Range   Osmolality, Ur 171 (*) 390 - 1090 mOsm/kg   Comment: Performed at [facility]  GLUCOSE, CAPILLARY     Status: Abnormal   Collection Time    12/14/12  5:24 PM      Result Value Range   Glucose-Capillary 152 (*) 70 - 99 mg/dL   Comment 1 Documented in Chart     Comment 2 Notify RN    GLUCOSE, CAPILLARY     Status: Abnormal   Collection Time    12/14/12  8:53 PM      Result Value Range   Glucose-Capillary 152 (*) 70 - 99 mg/dL   Comment 1 Notify RN    BASIC METABOLIC PANEL     Status: Abnormal   Collection Time    12/15/12  3:45 AM      Result Value Range   Sodium 131 (*) 135 - 145 mEq/L   Potassium 4.1  3.5 - 5.1 mEq/L   Chloride 95 (*) 96 - 112 mEq/L   CO2 29  19 - 32 mEq/L   Glucose, Bld 126 (*) 70 - 99 mg/dL   BUN 7  6 - 23 mg/dL   Creatinine, Ser 1.19  0.50 - 1.10 mg/dL   Calcium 8.8  8.4 - 14.7 mg/dL   GFR calc non Af Amer 83 (*) >90 mL/min   GFR calc Af Amer >90  >90 mL/min   Comment: (NOTE)     The eGFR has been calculated using the CKD EPI equation.     This calculation has not been validated in all clinical situations.     eGFR's persistently <90  mL/min signify possible Chronic Kidney     Disease.  GLUCOSE, CAPILLARY     Status: Abnormal   Collection Time    12/15/12  7:26 AM      Result Value Range   Glucose-Capillary 142 (*) 70 - 99 mg/dL   Comment 1 Notify RN    GLUCOSE, CAPILLARY     Status: Abnormal   Collection Time    12/15/12 11:47 AM      Result Value Range   Glucose-Capillary 163 (*) 70 - 99 mg/dL   Comment 1 Documented in Chart     Comment 2 Notify RN    GLUCOSE, CAPILLARY     Status: Abnormal   Collection Time    12/15/12  5:05 PM      Result Value Range   Glucose-Capillary 151 (*) 70 - 99 mg/dL   Comment 1 Notify RN    GLUCOSE, CAPILLARY     Status: Abnormal   Collection Time    12/15/12 10:02 PM       Result Value Range   Glucose-Capillary 178 (*) 70 - 99 mg/dL   Comment 1 Notify RN    BASIC METABOLIC PANEL     Status: Abnormal   Collection Time    12/16/12  3:50 AM      Result Value Range   Sodium 132 (*) 135 - 145 mEq/L   Potassium 3.8  3.5 - 5.1 mEq/L   Chloride 97  96 - 112 mEq/L   CO2 29  19 - 32 mEq/L   Glucose, Bld 124 (*) 70 - 99 mg/dL   BUN 5 (*) 6 - 23 mg/dL   Creatinine, Ser 1.61  0.50 - 1.10 mg/dL   Calcium 9.1  8.4 - 09.6 mg/dL   GFR calc non Af Amer 89 (*) >90 mL/min   GFR calc Af Amer >90  >90 mL/min   Comment: (NOTE)     The eGFR has been calculated using the CKD EPI equation.     This calculation has not been validated in all clinical situations.     eGFR's persistently <90 mL/min signify possible Chronic Kidney     Disease.  GLUCOSE, CAPILLARY     Status: Abnormal   Collection Time    12/16/12  8:03 AM      Result Value Range   Glucose-Capillary 166 (*) 70 - 99 mg/dL   Comment 1 Notify RN    GLUCOSE, CAPILLARY     Status: Abnormal   Collection Time    12/16/12  1:00 PM      Result Value Range   Glucose-Capillary 177 (*) 70 - 99 mg/dL   Comment 1 Notify RN     Labs are reviewed and are pertinent for hyponatremia and UTI.  Current Facility-Administered Medications  Medication Dose Route Frequency Provider Last Rate Last Dose  . 0.9 %  sodium chloride infusion   Intravenous Continuous Penny Pia, MD      . atorvastatin (LIPITOR) tablet 10 mg  10 mg Oral q1800 Penny Pia, MD   10 mg at 12/14/12 1740  . benazepril (LOTENSIN) tablet 20 mg  20 mg Oral BID Penny Pia, MD   20 mg at 12/16/12 0941  . clonazePAM (KLONOPIN) tablet 0.5 mg  0.5 mg Oral TID Maryruth Bun Rama, MD      . feeding supplement (ENSURE COMPLETE) liquid 237 mL  237 mL Oral Q1500 Lavena Bullion, RD   237 mL at 12/15/12 1719  . heparin injection 5,000  Units  5,000 Units Subcutaneous Q8H Penny Pia, MD   5,000 Units at 12/16/12 1412  . HYDROcodone-acetaminophen (NORCO/VICODIN) 5-325 MG  per tablet 1 tablet  1 tablet Oral Q6H PRN Penny Pia, MD   1 tablet at 12/16/12 1300  . insulin aspart (novoLOG) injection 0-15 Units  0-15 Units Subcutaneous TID WC Penny Pia, MD   3 Units at 12/16/12 1303  . levothyroxine (SYNTHROID, LEVOTHROID) tablet 125 mcg  125 mcg Oral QAC breakfast Penny Pia, MD   125 mcg at 12/16/12 0818  . magnesium hydroxide (MILK OF MAGNESIA) suspension 30 mL  30 mL Oral Daily PRN Maryruth Bun Rama, MD      . PARoxetine (PAXIL) tablet 40 mg  40 mg Oral QHS Maryruth Bun Rama, MD   40 mg at 12/15/12 2147  . polyethylene glycol (MIRALAX / GLYCOLAX) packet 17 g  17 g Oral Daily Maryruth Bun Rama, MD   17 g at 12/16/12 0941  . QUEtiapine (SEROQUEL) tablet 50 mg  50 mg Oral QHS Penny Pia, MD   50 mg at 12/15/12 2148    Psychiatric Specialty Exam:     Blood pressure 144/79, pulse 94, temperature 97.8 F (36.6 C), temperature source Oral, resp. rate 16, height 5\' 2"  (1.575 m), weight 184 lb 8.4 oz (83.7 kg), SpO2 100.00%.Body mass index is 33.74 kg/(m^2).  General Appearance: Casual  Eye Contact::  Fair  Speech:  Slow  Volume:  Increased  Mood:  Anxious  Affect:  Constricted and Depressed  Thought Process:  Intact  Orientation:  Other:  Alert and oriented x2  she had difficulty recalling things and events.   Thought Content:  Rumination and Blocking  Suicidal Thoughts:  No  Homicidal Thoughts:  No  Memory:  Confused, difficult to recall things  Judgement:  Fair  Insight:  Fair  Psychomotor Activity:  Decreased  Concentration:  Fair  Recall:  Poor  Akathisia:  No  Handed:  Right  AIMS (if indicated):     Assets:  Housing Social Support  Sleep:      Treatment Plan Summary: Medication management agrre with plan.  The patient most likely having confusion due to hyponatremia caused by psychotropic medication.  Continue Paxil 40 mg and Seroquel 50 mg.  Start Klonopin 0.5 mg 3 times a day to help the anxiety symptoms.  The patient is not seeing  psychiatrist consider followup appointment with outpatient psychiatrist for medication management.  The patient used to see therapist, consider restart counseling upon discharge.  Patient does not require inpatient psychiatric services at this time.  Plan discuss with Dr. Darnelle Catalan. Call C/L services if needed.   Christon Gallaway T. 12/16/2012 2:47 PM

## 2012-12-16 NOTE — Progress Notes (Signed)
Discharge instructions rendred to patient's brother Lodema Hong , he said he is unable to come to hospital. He verbalized understanding.- Hulda Marin RN

## 2012-12-16 NOTE — Progress Notes (Signed)
[facility] PROGRESS NOTE  Alexis Reese WRU:[id number] DOB: April 26, 1934 DOA: 12/13/2012 PCP: Josue Hector, MD  Brief narrative: Alexis Reese is an 78 y.o. female with a PMH of diabetes, hypertension, mild dementia, depression and anxiety was admitted on 12/13/2012 with increased confusion, lower abdominal pain and decreased by mouth intake. Upon initial evaluation emergency department, the patient was noted to have a sodium of 126 and evidence of a UTI with leukocytes in her urine.  Urine cultures were negative, and the patient's symptoms have been attributed to a combination of polypharmacy, metabolic encephalopathy from hyponatremia and severe anxiety.  Psychiatry consultation has been requested.  Assessment/Plan: Principal Problem:   Metabolic encephalopathy secondary to hyponatremia and polypharmacy -Appears to be related to SSRI therapy. Sodium improving with discontinuation of Cymbalta and a lower dose of Paxil. -Risperdal discontinued (already on Seroquel). -PT evaluation performed.  Refuses rehabilitation. We'll set up home health PT/OT/RN/aide and Child psychotherapist for transition to home. Active Problems:   Depression with anxiety -On multiple psychotropic medications including Paxil, Seroquel, Seroquel and Cymbalta. Discontinued Cymbalta and Risperdal. Paxil dose being weaned down. -TSH within normal limits. -Psychiatry evaluation requested.   DM type 2 (diabetes mellitus, type 2) -On moderate scale SSI Q. A.c. -CBGs 142-163.   HTN (hypertension) -Continue Lotensin.   UTI (lower urinary tract infection) -Stop empiric Rocephin. Urine cultures grew 80,000 colonies of multiple bacterial morphotypes.  Code Status: Full. Family Communication: Brother Lodema Hong [phone]) updated at bedside 12/15/2012. Disposition Plan: Home when stable.   Medical Consultants:  Psychiatry.  Other Consultants:  None.  Anti-infectives:  Rocephin 12/13/2012--->  12/15/2012  HPI/Subjective: Alexis Reese continues to be anxious, complaining that she doesn't feel well and that she continues to have lower abdominal pain and no appetite. She repeatedly asks the same questions and becomes tearful with poor concentration and ability to comprehend and answers given.  Objective: Filed Vitals:   12/15/12 0605 12/15/12 1400 12/15/12 2050 12/16/12 0614  BP: 158/85 161/92 152/79 156/88  Pulse: 82 101 85 74  Temp: 98 F (36.7 C) 97.6 F (36.4 C) 98.1 F (36.7 C) 97.8 F (36.6 C)  TempSrc: Oral Oral Oral Oral  Resp: 20 20 18 16   Height:      Weight:      SpO2: 98% 97% 100% 97%    Intake/Output Summary (Last 24 hours) at 12/16/12 0941 Last data filed at 12/15/12 2220  Gross per 24 hour  Intake    360 ml  Output   1450 ml  Net  -1090 ml    Exam: Gen:  NAD Cardiovascular:  RRR, No M/R/G Respiratory:  Lungs CTAB Gastrointestinal:  Abdomen soft, NT/ND, + BS Extremities:  No C/E/C  Data Reviewed: Basic Metabolic Panel:  Recent Labs Lab 12/13/12 1939 12/14/12 0645 12/15/12 0345 12/16/12 0350  NA 126* 127* 131* 132*  K 4.4 4.1 4.1 3.8  CL 88* 92* 95* 97  CO2 30 28 29 29   GLUCOSE 126* 128* 126* 124*  BUN 7 8 7  5*  CREATININE 0.72 0.60 0.67 0.55  CALCIUM 9.7 8.9 8.8 9.1   GFR Estimated Creatinine Clearance: 60 ml/min (by C-G formula based on Cr of 0.55).  CBC:  Recent Labs Lab 12/13/12 1939 12/14/12 0645  WBC 5.7 6.0  NEUTROABS 4.1  --   HGB 15.6* 13.6  HCT 44.3 38.2  MCV 87.7 88.2  PLT 170 172   CBG:  Recent Labs Lab 12/15/12 0726 12/15/12 1147 12/15/12 1705 12/15/12 2202 12/16/12 0803  GLUCAP 142* 163* 151* 178* 166*   Thyroid function studies  Recent Labs  12/14/12 0645  TSH 0.952   Microbiology Recent Results (from the past 240 hour(s))  URINE CULTURE     Status: None   Collection Time    12/13/12  9:35 PM      Result Value Range Status   Specimen Description URINE, CLEAN CATCH   Final   Special  Requests NONE   Final   Culture  Setup Time     Final   Value: 12/14/2012 09:32     Performed at [facility] Count     Final   Value: 80,000 COLONIES/ML     Performed at [facility]   Culture     Final   Value: Multiple bacterial morphotypes present, none predominant. Suggest appropriate recollection if clinically indicated.     Performed at [facility]   Report Status 12/15/2012 FINAL   Final     Procedures and Diagnostic Studies: None.  Scheduled Meds: . atorvastatin  10 mg Oral q1800  . benazepril  20 mg Oral BID  . feeding supplement  237 mL Oral Q1500  . heparin  5,000 Units Subcutaneous Q8H  . insulin aspart  0-15 Units Subcutaneous TID WC  . levothyroxine  125 mcg Oral QAC breakfast  . PARoxetine  40 mg Oral QHS  . polyethylene glycol  17 g Oral Daily  . QUEtiapine  50 mg Oral QHS   Continuous Infusions: . sodium chloride 100 mL/hr at 12/16/12 0646    Time spent: 25 minutes.   LOS: 3 days   RAMA,CHRISTINA  [facility] Pager [phone].   *Please note that the hospitalists switch teams on Wednesdays. Please call the flow manager at [phone] if you are having difficulty reaching the hospitalist taking care of this patient as she can update you and provide the most up-to-date pager number of provider caring for the patient. If 8PM-8AM, please contact night-coverage at [email or web address], password [email or web address]  12/16/2012, 9:41 AM

## 2012-12-16 NOTE — Care Management Note (Signed)
Cm spoke with patient at the bedside with MD present concerning dc planning. Pt recommendation for SNF vs HHPT. Pt provided Cm permission to contact brother who resides with patient full time and assist in home care. Per Lodema Hong, pt brother, pt to dc home with [facility] services. Per choice [facility] to provide RN/PT/OT/Aide/SW. Pt request wheelchair and BSC. [facility] dme rep notified. Pt will require tx home. CSW made aware.   Roxy Manns Catlynn Grondahl,RN,MSN [phone]

## 2012-12-16 NOTE — Progress Notes (Signed)
CSW received notification from RN that pt d/c instructions are in [facility] and requested CSW arrange ambulance transportation.  CSW spoke with pt brother via telephone and confirmed address.  CSW arranged ambulance transportation for pt to home (Service Request ID#: [id number]).  No further social work needs identified at this time.  CSW signing off.   Jacklynn Lewis, MSW, LCSWA  Clinical Social Work [phone]

## 2012-12-16 NOTE — Progress Notes (Signed)
Patient c/o back pain. Dr. Darnelle Catalan notified we will give patient a dose of hydrocodone 5/325 x 1 dose per MD order.- Hulda Marin RN

## 2012-12-16 NOTE — Progress Notes (Signed)
Patient d/c home via ambulance. I called his brotherJohn re: transfer.Patient is stable on d/c.- Hulda Marin RN

## 2012-12-28 ENCOUNTER — Ambulatory Visit (HOSPITAL_COMMUNITY): Payer: Medicare Other | Admitting: Psychiatry

## 2013-03-24 ENCOUNTER — Emergency Department (HOSPITAL_COMMUNITY): Payer: Medicare Other

## 2013-03-24 ENCOUNTER — Encounter (HOSPITAL_COMMUNITY): Payer: Self-pay | Admitting: Emergency Medicine

## 2013-03-24 ENCOUNTER — Emergency Department (HOSPITAL_COMMUNITY)
Admission: EM | Admit: 2013-03-24 | Discharge: 2013-03-24 | Disposition: A | Discharge status: Home / Self Care | Payer: Medicare Other | Attending: Emergency Medicine | Admitting: Emergency Medicine

## 2013-03-24 DIAGNOSIS — I10 Essential (primary) hypertension: Secondary | ICD-10-CM | POA: Insufficient documentation

## 2013-03-24 DIAGNOSIS — E119 Type 2 diabetes mellitus without complications: Secondary | ICD-10-CM | POA: Insufficient documentation

## 2013-03-24 DIAGNOSIS — IMO0002 Reserved for concepts with insufficient information to code with codable children: Secondary | ICD-10-CM | POA: Insufficient documentation

## 2013-03-24 DIAGNOSIS — F419 Anxiety disorder, unspecified: Secondary | ICD-10-CM

## 2013-03-24 DIAGNOSIS — Z8719 Personal history of other diseases of the digestive system: Secondary | ICD-10-CM | POA: Insufficient documentation

## 2013-03-24 DIAGNOSIS — J3489 Other specified disorders of nose and nasal sinuses: Secondary | ICD-10-CM | POA: Insufficient documentation

## 2013-03-24 DIAGNOSIS — E079 Disorder of thyroid, unspecified: Secondary | ICD-10-CM | POA: Insufficient documentation

## 2013-03-24 DIAGNOSIS — Z859 Personal history of malignant neoplasm, unspecified: Secondary | ICD-10-CM | POA: Insufficient documentation

## 2013-03-24 DIAGNOSIS — M79609 Pain in unspecified limb: Secondary | ICD-10-CM | POA: Insufficient documentation

## 2013-03-24 DIAGNOSIS — Z79899 Other long term (current) drug therapy: Secondary | ICD-10-CM | POA: Insufficient documentation

## 2013-03-24 DIAGNOSIS — F411 Generalized anxiety disorder: Secondary | ICD-10-CM | POA: Insufficient documentation

## 2013-03-24 DIAGNOSIS — R079 Chest pain, unspecified: Secondary | ICD-10-CM | POA: Insufficient documentation

## 2013-03-24 HISTORY — DX: Anxiety disorder, unspecified: F41.9

## 2013-03-24 HISTORY — DX: Disorder of thyroid, unspecified: E07.9

## 2013-03-24 LAB — COMPREHENSIVE METABOLIC PANEL
AST: 23 U/L (ref 0–37)
BUN: 7 mg/dL (ref 6–23)
CO2: 28 mEq/L (ref 19–32)
Chloride: 94 mEq/L — ABNORMAL LOW (ref 96–112)
Creatinine, Ser: 0.74 mg/dL (ref 0.50–1.10)
GFR calc non Af Amer: 81 mL/min — ABNORMAL LOW (ref >90)
Total Bilirubin: 0.4 mg/dL (ref 0.3–1.2)

## 2013-03-24 LAB — TROPONIN I: Troponin I: <0.3 ng/mL (ref <0.30)

## 2013-03-24 LAB — CBC WITH DIFFERENTIAL/PLATELET
HCT: 42.2 % (ref 36.0–46.0)
Hemoglobin: 14.7 g/dL (ref 12.0–15.0)
Lymphocytes Relative: 30 % (ref 12–46)
Lymphs Abs: 1.5 10*3/uL (ref 0.7–4.0)
Monocytes Absolute: 0.4 10*3/uL (ref 0.1–1.0)
Monocytes Relative: 8 % (ref 3–12)
Neutro Abs: 3 10*3/uL (ref 1.7–7.7)
WBC: 5.1 10*3/uL (ref 4.0–10.5)

## 2013-03-24 MED ORDER — HYDROCODONE-ACETAMINOPHEN 5-325 MG PO TABS: 1.0000 | ORAL_TABLET | Freq: Four times a day (QID) | ORAL | Status: DC | PRN

## 2013-03-24 MED ORDER — OXYCODONE-ACETAMINOPHEN 5-325 MG PO TABS
1.0000 | ORAL_TABLET | Freq: Once | ORAL | Status: AC
  Administered 2013-03-24: 1 via ORAL
  Filled 2013-03-24: qty 1

## 2013-03-24 MED ORDER — LORAZEPAM 1 MG PO TABS
1.0000 mg | ORAL_TABLET | Freq: Once | ORAL | Status: AC
  Administered 2013-03-24: 1 mg via ORAL
  Filled 2013-03-24: qty 1

## 2013-03-24 NOTE — ED Notes (Signed)
Patient brought in via EMS. Alert and oriented. Airway patent. Patient c/o midsternal chest "soreness," non-radiating. Patient very anxious and tearful. Patient states "I'm stopped up. I can't breath out of my nose good. I feel congested, like my sinus ar acing up and I feel dry inside." Patient states that he has had no appetite and has not been drinking any water. Patient requesting something to eat and drink. Patient also c/o left lower leg pain that radiates into left ankle.

## 2013-03-24 NOTE — ED Provider Notes (Signed)
CSN: [id number]     Arrival date & time 03/24/13  0350 History   First MD Initiated Contact with Patient 03/24/13 [phone]     Chief Complaint  Patient presents with  . Chest Pain  . Leg Pain  . Nasal Congestion   (Consider location/radiation/quality/duration/timing/severity/associated sxs/prior Treatment) Patient is a 78 y.o. female presenting with chest pain and leg pain. The history is provided by the patient (the pt complains of nasal congestion and chest pain).  Chest Pain Pain location:  R chest Pain quality: aching   Pain radiates to:  Does not radiate Pain radiates to the back: no   Pain severity:  Mild Onset quality:  Gradual Timing:  Constant Progression:  Unchanged Chronicity:  Recurrent Context: breathing   Associated symptoms: no abdominal pain, no back pain, no cough, no fatigue and no headache   Leg Pain Associated symptoms: no back pain and no fatigue     Past Medical History  Diagnosis Date  . Cancer   . Hypertension   . Diabetes mellitus   . GERD (gastroesophageal reflux disease)   . Thyroid disease   . Anxiety    Past Surgical History  Procedure Laterality Date  . Mastectomy    . Cholecystectomy     History reviewed. No pertinent family history. History  Substance Use Topics  . Smoking status: Never Smoker   . Smokeless tobacco: Never Used  . Alcohol Use: No   OB History   Grav Para Term Preterm Abortions TAB SAB Ect Mult Living   2 2 2       2      Review of Systems  Constitutional: Negative for appetite change and fatigue.  HENT: Negative for congestion, ear discharge and sinus pressure.   Eyes: Negative for discharge.  Respiratory: Negative for cough.   Cardiovascular: Positive for chest pain.  Gastrointestinal: Negative for abdominal pain and diarrhea.  Genitourinary: Negative for frequency and hematuria.  Musculoskeletal: Negative for back pain.  Skin: Negative for rash.  Neurological: Negative for seizures and headaches.   Psychiatric/Behavioral: Negative for hallucinations.    Allergies  Review of patient's allergies indicates no known allergies.  Home Medications   Current Outpatient Rx  Name  Route  Sig  Dispense  Refill  . benazepril (LOTENSIN) 20 MG tablet   Oral   Take 20 mg by mouth 2 (two) times daily.         . calcitonin, salmon, (MIACALCIN/FORTICAL) 200 UNIT/ACT nasal spray   Nasal   Place 1 spray into the nose daily. Alternating sids of  Nose daily         . clonazePAM (KLONOPIN) 0.5 MG tablet   Oral   Take 1 tablet (0.5 mg total) by mouth 3 (three) times daily as needed for anxiety.   20 tablet   0   . cyanocobalamin (,VITAMIN B-12,) 1000 MCG/ML injection   Subcutaneous   Inject 1,000 mcg into the skin every 30 (thirty) days.         . fluticasone (FLONASE) 50 MCG/ACT nasal spray   Nasal   Place 2 sprays into the nose every morning.          Marland Kitchen HYDROcodone-acetaminophen (NORCO/VICODIN) 5-325 MG per tablet   Oral   Take 1 tablet by mouth every 6 (six) hours as needed for moderate pain.   20 tablet   0   . levothyroxine (SYNTHROID, LEVOTHROID) 125 MCG tablet   Oral   Take 125 mcg by mouth daily before breakfast.         .  loratadine (CLARITIN) 10 MG tablet   Oral   Take 10 mg by mouth every morning.         Marland Kitchen PARoxetine (PAXIL) 40 MG tablet   Oral   Take 1 tablet (40 mg total) by mouth at bedtime.   30 tablet   3   . Pitavastatin Calcium (LIVALO) 2 MG TABS   Oral   Take 1 mg by mouth every morning.         Marland Kitchen QUEtiapine (SEROQUEL) 50 MG tablet   Oral   Take 50 mg by mouth at bedtime.         . Vitamin D, Ergocalciferol, (DRISDOL) 50000 UNITS CAPS   Oral   Take 50,000 Units by mouth every 7 (seven) days. Taken on Thursdays          BP 146/113  Pulse 91  Temp(Src) 97.9 F (36.6 C) (Oral)  Resp 20  Ht 5\' 3"  (1.6 m)  Wt 184 lb (83.462 kg)  BMI 32.60 kg/m2  SpO2 96% Physical Exam  Constitutional: She is oriented to person, place, and  time. She appears well-developed.  HENT:  Head: Normocephalic.  Eyes: Conjunctivae and EOM are normal. No scleral icterus.  Neck: Neck supple. No thyromegaly present.  Cardiovascular: Normal rate and regular rhythm.  Exam reveals no gallop and no friction rub.   No murmur heard. Pulmonary/Chest: No stridor. She has no wheezes. She has no rales. She exhibits no tenderness.  Abdominal: She exhibits no distension. There is no tenderness. There is no rebound.  Musculoskeletal: Normal range of motion. She exhibits no edema.  Lymphadenopathy:    She has no cervical adenopathy.  Neurological: She is oriented to person, place, and time. She exhibits normal muscle tone. Coordination normal.  Skin: No rash noted. No erythema.  Psychiatric: She has a normal mood and affect. Her behavior is normal.    ED Course  Procedures (including critical care time) Labs Review Labs Reviewed  COMPREHENSIVE METABOLIC PANEL - Abnormal; Notable for the following:    Sodium 134 (*)    Chloride 94 (*)    Glucose, Bld 135 (*)    GFR calc non Af Amer 81 (*)    All other components within normal limits  CBC WITH DIFFERENTIAL  TROPONIN I   Imaging Review Dg Chest 2 View  03/24/2013   CLINICAL DATA:  Chest and leg pain  EXAM: CHEST  2 VIEW  COMPARISON:  05/25/2012  FINDINGS: Mild cardiomegaly. The mediastinal width is more normal than previous. Aortic tortuosity persists. Chronic elevation of the right diaphragm. No infiltrate or edema suspected. Possible emphysema at theapices. Status post right axillary dissection. Compression fractures in the lower thoracic spine are stable when correlated with lumbar spine radiography 12/05/2012 and spine CT 11/05/2008.  IMPRESSION: No active cardiopulmonary disease.   Electronically Signed   By: Tiburcio Pea M.D.   On: 03/24/2013 06:15    EKG Interpretation   None      Date: 03/24/2013  Rate: 91  Rhythm: normal sinus rhythm  QRS Axis: normal  Intervals: normal   ST/T Wave abnormalities: nonspecific ST changes  Conduction Disutrbances:none  Narrative Interpretation:   Old EKG Reviewed: none available    MDM   1. Chest pain   2. Anxiety        Benny Lennert, MD 03/24/13 [phone]

## 2013-03-27 ENCOUNTER — Other Ambulatory Visit (HOSPITAL_COMMUNITY): Payer: Self-pay | Admitting: Internal Medicine

## 2013-05-03 ENCOUNTER — Emergency Department (HOSPITAL_COMMUNITY): Payer: Medicare Other

## 2013-05-03 ENCOUNTER — Emergency Department (HOSPITAL_COMMUNITY)
Admission: EM | Admit: 2013-05-03 | Discharge: 2013-05-04 | Disposition: A | Discharge status: Home / Self Care | Payer: Medicare Other | Attending: Emergency Medicine | Admitting: Emergency Medicine

## 2013-05-03 ENCOUNTER — Encounter (HOSPITAL_COMMUNITY): Payer: Self-pay | Admitting: Emergency Medicine

## 2013-05-03 DIAGNOSIS — R079 Chest pain, unspecified: Secondary | ICD-10-CM | POA: Insufficient documentation

## 2013-05-03 DIAGNOSIS — F411 Generalized anxiety disorder: Secondary | ICD-10-CM | POA: Insufficient documentation

## 2013-05-03 DIAGNOSIS — E119 Type 2 diabetes mellitus without complications: Secondary | ICD-10-CM | POA: Insufficient documentation

## 2013-05-03 DIAGNOSIS — I10 Essential (primary) hypertension: Secondary | ICD-10-CM | POA: Insufficient documentation

## 2013-05-03 DIAGNOSIS — Z79899 Other long term (current) drug therapy: Secondary | ICD-10-CM | POA: Insufficient documentation

## 2013-05-03 DIAGNOSIS — E079 Disorder of thyroid, unspecified: Secondary | ICD-10-CM | POA: Insufficient documentation

## 2013-05-03 DIAGNOSIS — M2540 Effusion, unspecified joint: Secondary | ICD-10-CM | POA: Insufficient documentation

## 2013-05-03 DIAGNOSIS — J069 Acute upper respiratory infection, unspecified: Secondary | ICD-10-CM | POA: Insufficient documentation

## 2013-05-03 DIAGNOSIS — Z859 Personal history of malignant neoplasm, unspecified: Secondary | ICD-10-CM | POA: Insufficient documentation

## 2013-05-03 DIAGNOSIS — Z8719 Personal history of other diseases of the digestive system: Secondary | ICD-10-CM | POA: Insufficient documentation

## 2013-05-03 DIAGNOSIS — IMO0002 Reserved for concepts with insufficient information to code with codable children: Secondary | ICD-10-CM | POA: Insufficient documentation

## 2013-05-03 LAB — URINALYSIS, ROUTINE W REFLEX MICROSCOPIC
BILIRUBIN URINE: NEGATIVE
Glucose, UA: 100 mg/dL — AB
Hgb urine dipstick: NEGATIVE
KETONES UR: NEGATIVE mg/dL
NITRITE: NEGATIVE
PROTEIN: NEGATIVE mg/dL
Specific Gravity, Urine: <1.005 — ABNORMAL LOW (ref 1.005–1.030)
UROBILINOGEN UA: 0.2 mg/dL (ref 0.0–1.0)
pH: 6 (ref 5.0–8.0)

## 2013-05-03 LAB — CBC WITH DIFFERENTIAL/PLATELET
Basophils Absolute: 0 10*3/uL (ref 0.0–0.1)
Basophils Relative: 1 % (ref 0–1)
Eosinophils Absolute: 0.2 10*3/uL (ref 0.0–0.7)
Eosinophils Relative: 5 % (ref 0–5)
HEMATOCRIT: 43.3 % (ref 36.0–46.0)
HEMOGLOBIN: 14.9 g/dL (ref 12.0–15.0)
LYMPHS ABS: 1 10*3/uL (ref 0.7–4.0)
Lymphocytes Relative: 26 % (ref 12–46)
MCH: 31.3 pg (ref 26.0–34.0)
MCHC: 34.4 g/dL (ref 30.0–36.0)
MCV: 91 fL (ref 78.0–100.0)
MONO ABS: 0.4 10*3/uL (ref 0.1–1.0)
MONOS PCT: 11 % (ref 3–12)
NEUTROS ABS: 2.1 10*3/uL (ref 1.7–7.7)
NEUTROS PCT: 57 % (ref 43–77)
Platelets: 161 10*3/uL (ref 150–400)
RBC: 4.76 MIL/uL (ref 3.87–5.11)
RDW: 12.4 % (ref 11.5–15.5)
WBC: 3.6 10*3/uL — ABNORMAL LOW (ref 4.0–10.5)

## 2013-05-03 LAB — URINE MICROSCOPIC-ADD ON

## 2013-05-03 LAB — COMPREHENSIVE METABOLIC PANEL
ALK PHOS: 60 U/L (ref 39–117)
ALT: 86 U/L — ABNORMAL HIGH (ref 0–35)
AST: 160 U/L — ABNORMAL HIGH (ref 0–37)
Albumin: 3.5 g/dL (ref 3.5–5.2)
BILIRUBIN TOTAL: 0.3 mg/dL (ref 0.3–1.2)
BUN: 9 mg/dL (ref 6–23)
CHLORIDE: 91 meq/L — AB (ref 96–112)
CO2: 32 mEq/L (ref 19–32)
CREATININE: 0.78 mg/dL (ref 0.50–1.10)
Calcium: 9.3 mg/dL (ref 8.4–10.5)
GFR, EST NON AFRICAN AMERICAN: 79 mL/min — AB (ref >90)
GLUCOSE: 187 mg/dL — AB (ref 70–99)
POTASSIUM: 4.5 meq/L (ref 3.7–5.3)
Sodium: 134 mEq/L — ABNORMAL LOW (ref 137–147)
Total Protein: 7.3 g/dL (ref 6.0–8.3)

## 2013-05-03 LAB — PRO B NATRIURETIC PEPTIDE: PRO B NATRI PEPTIDE: 44 pg/mL (ref 0–450)

## 2013-05-03 LAB — POCT I-STAT TROPONIN I: Troponin i, poc: 0 ng/mL (ref 0.00–0.08)

## 2013-05-03 LAB — CG4 I-STAT (LACTIC ACID): Lactic Acid, Venous: 1.31 mmol/L (ref 0.5–2.2)

## 2013-05-03 MED ORDER — ALBUTEROL SULFATE HFA 108 (90 BASE) MCG/ACT IN AERS: 1.0000 | INHALATION_SPRAY | RESPIRATORY_TRACT | Status: DC

## 2013-05-03 MED ORDER — DM-GUAIFENESIN ER 30-600 MG PO TB12: 1.0000 | ORAL_TABLET | Freq: Two times a day (BID) | ORAL | Status: DC

## 2013-05-03 NOTE — ED Provider Notes (Signed)
CSN: [id number]     Arrival date & time 05/03/13  2126 History  This chart was scribed for Carmin Muskrat, MD by Zettie Pho, ED Scribe. This patient was seen in room APA05/APA05 and the patient's care was started at 9:39 PM.    Chief Complaint  Patient presents with  . Shortness of Breath  . Cough   The history is provided by the patient and a relative (her son). No language interpreter was used.   HPI Comments: Alexis Reese is a 79 y.o. female with a history of HTN and anxiety who presents to the Emergency Department complaining of an intermittent, dry cough with associated diffuse myalgias and weakness onset a few days ago that has been progressively worsening. She reports some chest pain secondary to the cough. Her son reports an associated tactile fever (patient is afebrile at 98.4 in the ED). She reports some associated swelling to the bilateral feet that she states has been ongoing for a while. Patient denies taking any medications at home to treat her symptoms. Her son states that the patient has been in and out of the hospital recently. She denies nausea, emesis, diarrhea, syncope. Patient also has a history of DM and thyroid disease. Patient does not smoke or drink.   Past Medical History  Diagnosis Date  . Cancer   . Hypertension   . Diabetes mellitus   . GERD (gastroesophageal reflux disease)   . Thyroid disease   . Anxiety    Past Surgical History  Procedure Laterality Date  . Mastectomy    . Cholecystectomy     History reviewed. No pertinent family history. History  Substance Use Topics  . Smoking status: Never Smoker   . Smokeless tobacco: Never Used  . Alcohol Use: No   OB History   Grav Para Term Preterm Abortions TAB SAB Ect Mult Living   2 2 2       2      Review of Systems  Constitutional: Positive for fever (subjective/tactile).       Per HPI, otherwise negative  HENT:       Per HPI, otherwise negative  Respiratory: Positive for cough.        Per  HPI, otherwise negative  Cardiovascular: Positive for chest pain.       Per HPI, otherwise negative  Gastrointestinal: Negative for nausea, vomiting and diarrhea.  Endocrine:       Negative aside from HPI  Genitourinary:       Neg aside from HPI   Musculoskeletal: Positive for joint swelling and myalgias.       Per HPI, otherwise negative  Skin: Negative.   Neurological: Positive for weakness. Negative for syncope.    Allergies  Review of patient's allergies indicates no known allergies.  Home Medications   Current Outpatient Rx  Name  Route  Sig  Dispense  Refill  . benazepril (LOTENSIN) 20 MG tablet   Oral   Take 20 mg by mouth 2 (two) times daily.         . calcitonin, salmon, (MIACALCIN/FORTICAL) 200 UNIT/ACT nasal spray   Nasal   Place 1 spray into the nose daily. Alternating sids of  Nose daily         . clonazePAM (KLONOPIN) 0.5 MG tablet   Oral   Take 1 tablet (0.5 mg total) by mouth 3 (three) times daily as needed for anxiety.   20 tablet   0   . cyanocobalamin (,VITAMIN B-12,) 1000 MCG/ML injection  Subcutaneous   Inject 1,000 mcg into the skin every 30 (thirty) days.         . fluticasone (FLONASE) 50 MCG/ACT nasal spray   Nasal   Place 2 sprays into the nose every morning.          Marland Kitchen HYDROcodone-acetaminophen (NORCO/VICODIN) 5-325 MG per tablet   Oral   Take 1 tablet by mouth every 6 (six) hours as needed for moderate pain.   20 tablet   0   . levothyroxine (SYNTHROID, LEVOTHROID) 125 MCG tablet   Oral   Take 125 mcg by mouth daily before breakfast.         . loratadine (CLARITIN) 10 MG tablet   Oral   Take 10 mg by mouth every morning.         Marland Kitchen PARoxetine (PAXIL) 40 MG tablet   Oral   Take 1 tablet (40 mg total) by mouth at bedtime.   30 tablet   3   . Pitavastatin Calcium (LIVALO) 2 MG TABS   Oral   Take 1 mg by mouth every morning.         Marland Kitchen QUEtiapine (SEROQUEL) 50 MG tablet   Oral   Take 50 mg by mouth at bedtime.          . Vitamin D, Ergocalciferol, (DRISDOL) 50000 UNITS CAPS   Oral   Take 50,000 Units by mouth every 7 (seven) days. Taken on Thursdays          Triage Vitals: BP 142/87  Pulse 88  Temp(Src) 98.4 F (36.9 C) (Oral)  Resp 20  SpO2 96%  Physical Exam  Nursing note and vitals reviewed. Constitutional: She is oriented to person, place, and time. She appears well-developed and well-nourished. No distress.  HENT:  Head: Normocephalic and atraumatic.  Eyes: Conjunctivae and EOM are normal.  Cardiovascular: Normal rate and regular rhythm.   Pulmonary/Chest: Effort normal. No stridor. No respiratory distress.  Coarse breath sounds on the right.   Abdominal: Soft. She exhibits no distension. There is no tenderness. There is no rebound and no guarding.  Musculoskeletal: She exhibits no edema.  No swelling to the bilateral lower extremities.   Neurological: She is alert and oriented to person, place, and time. No cranial nerve deficit.  Skin: Skin is warm and dry.  Psychiatric: She has a normal mood and affect.    ED Course  Procedures (including critical care time)  COORDINATION OF CARE: 9:43 PM- Will order blood labs, UA, EKG, and a chest x-ray. Discussed treatment plan with patient at bedside and patient verbalized agreement.   11:30 PM On repeat exam the patient is awake and alert, speaking clearly.  We discussed all findings.  Labs Review Labs Reviewed  CBC WITH DIFFERENTIAL - Abnormal; Notable for the following:    WBC 3.6 (*)    All other components within normal limits  COMPREHENSIVE METABOLIC PANEL  PRO B NATRIURETIC PEPTIDE  URINALYSIS, ROUTINE W REFLEX MICROSCOPIC  CG4 I-STAT (LACTIC ACID)  POCT I-STAT TROPONIN I   Imaging Review No results found.  EKG Interpretation    Date/Time:  Wednesday May 03 2013 22:10:50 EST Ventricular Rate:  83 PR Interval:  148 QRS Duration: 72 QT Interval:  396 QTC Calculation: 465 R Axis:   15 Text Interpretation:   Normal sinus rhythm Normal ECG When compared with ECG of 24-Mar-2013 03:53, No significant change was found Sinus rhythm Normal ECG Confirmed by Carmin Muskrat  MD ([id number]) on 05/03/2013 10:18:36 PM  MDM  No diagnosis found.  I personally performed the services described in this documentation, which was scribed in my presence. The recorded information has been reviewed and is accurate.   This elderly female presents with ongoing cough, pain associated with coughing.  Notably, the patient is no fever, is awake, alert, and in no distress.  Patient's evaluation is largely reassuring and, though with her persistent cough, pain associated with this she was started on a course of medication for relief.  Absent evidence of distress, systemic illness, imminent decompensation she is appropriate for discharge with primary care followup.  She has a physician whom she will call tomorrow.    Carmin Muskrat, MD 05/03/13 [phone]

## 2013-05-03 NOTE — Discharge Instructions (Signed)
As discussed, your evaluation tonight was largely reassuring, and there is currently no evidence of pneumonia or other system-wide infection.  However, it is important that you continue to have your condition and monitored and managed by a primary care physician.   Return here for concerning changes in your condition.

## 2013-05-03 NOTE — ED Notes (Addendum)
Pt to department via EMS.  Per report, call was for SOB.  Pt showing no signs of distress at present time.  Pt does report non-productive occasional cough. Pt also reporting chronic generalized weakness and pain in legs.

## 2013-05-05 LAB — URINE CULTURE

## 2013-05-17 ENCOUNTER — Emergency Department (HOSPITAL_COMMUNITY): Payer: Medicare Other

## 2013-05-17 ENCOUNTER — Emergency Department (HOSPITAL_COMMUNITY)
Admission: EM | Admit: 2013-05-17 | Discharge: 2013-05-21 | Disposition: A | Discharge status: Skilled Nursing Facility | Payer: Medicare Other | Attending: Emergency Medicine | Admitting: Emergency Medicine

## 2013-05-17 ENCOUNTER — Encounter (HOSPITAL_COMMUNITY): Payer: Self-pay | Admitting: Emergency Medicine

## 2013-05-17 DIAGNOSIS — I10 Essential (primary) hypertension: Secondary | ICD-10-CM | POA: Insufficient documentation

## 2013-05-17 DIAGNOSIS — F29 Unspecified psychosis not due to a substance or known physiological condition: Secondary | ICD-10-CM | POA: Insufficient documentation

## 2013-05-17 DIAGNOSIS — Z8719 Personal history of other diseases of the digestive system: Secondary | ICD-10-CM | POA: Insufficient documentation

## 2013-05-17 DIAGNOSIS — Z79899 Other long term (current) drug therapy: Secondary | ICD-10-CM | POA: Insufficient documentation

## 2013-05-17 DIAGNOSIS — Z859 Personal history of malignant neoplasm, unspecified: Secondary | ICD-10-CM | POA: Insufficient documentation

## 2013-05-17 DIAGNOSIS — R609 Edema, unspecified: Secondary | ICD-10-CM | POA: Insufficient documentation

## 2013-05-17 DIAGNOSIS — E119 Type 2 diabetes mellitus without complications: Secondary | ICD-10-CM | POA: Insufficient documentation

## 2013-05-17 DIAGNOSIS — F0390 Unspecified dementia without behavioral disturbance: Secondary | ICD-10-CM | POA: Insufficient documentation

## 2013-05-17 DIAGNOSIS — IMO0002 Reserved for concepts with insufficient information to code with codable children: Secondary | ICD-10-CM | POA: Insufficient documentation

## 2013-05-17 DIAGNOSIS — E079 Disorder of thyroid, unspecified: Secondary | ICD-10-CM | POA: Insufficient documentation

## 2013-05-17 DIAGNOSIS — R3919 Other difficulties with micturition: Secondary | ICD-10-CM | POA: Insufficient documentation

## 2013-05-17 LAB — CBC WITH DIFFERENTIAL/PLATELET
Basophils Absolute: 0 10*3/uL (ref 0.0–0.1)
Basophils Relative: 1 % (ref 0–1)
EOS PCT: 0 % (ref 0–5)
Eosinophils Absolute: 0 10*3/uL (ref 0.0–0.7)
HCT: 40.8 % (ref 36.0–46.0)
HEMOGLOBIN: 13.9 g/dL (ref 12.0–15.0)
LYMPHS ABS: 1 10*3/uL (ref 0.7–4.0)
LYMPHS PCT: 12 % (ref 12–46)
MCH: 30.5 pg (ref 26.0–34.0)
MCHC: 34.1 g/dL (ref 30.0–36.0)
MCV: 89.7 fL (ref 78.0–100.0)
MONOS PCT: 9 % (ref 3–12)
Monocytes Absolute: 0.8 10*3/uL (ref 0.1–1.0)
Neutro Abs: 6.3 10*3/uL (ref 1.7–7.7)
Neutrophils Relative %: 78 % — ABNORMAL HIGH (ref 43–77)
Platelets: 252 10*3/uL (ref 150–400)
RBC: 4.55 MIL/uL (ref 3.87–5.11)
RDW: 12.2 % (ref 11.5–15.5)
WBC: 8.1 10*3/uL (ref 4.0–10.5)

## 2013-05-17 LAB — COMPREHENSIVE METABOLIC PANEL
ALT: 20 U/L (ref 0–35)
AST: 26 U/L (ref 0–37)
Albumin: 3.6 g/dL (ref 3.5–5.2)
Alkaline Phosphatase: 66 U/L (ref 39–117)
BUN: 6 mg/dL (ref 6–23)
CALCIUM: 9.4 mg/dL (ref 8.4–10.5)
CO2: 28 mEq/L (ref 19–32)
Chloride: 94 mEq/L — ABNORMAL LOW (ref 96–112)
Creatinine, Ser: 0.77 mg/dL (ref 0.50–1.10)
GFR, EST NON AFRICAN AMERICAN: 80 mL/min — AB (ref >90)
GLUCOSE: 183 mg/dL — AB (ref 70–99)
Potassium: 4.2 mEq/L (ref 3.7–5.3)
SODIUM: 134 meq/L — AB (ref 137–147)
Total Bilirubin: 0.5 mg/dL (ref 0.3–1.2)
Total Protein: 7.5 g/dL (ref 6.0–8.3)

## 2013-05-17 LAB — URINALYSIS, ROUTINE W REFLEX MICROSCOPIC
BILIRUBIN URINE: NEGATIVE
Glucose, UA: NEGATIVE mg/dL
Hgb urine dipstick: NEGATIVE
Ketones, ur: NEGATIVE mg/dL
Leukocytes, UA: NEGATIVE
Nitrite: NEGATIVE
Protein, ur: NEGATIVE mg/dL
SPECIFIC GRAVITY, URINE: 1.015 (ref 1.005–1.030)
UROBILINOGEN UA: 1 mg/dL (ref 0.0–1.0)
pH: 6.5 (ref 5.0–8.0)

## 2013-05-17 MED ORDER — LORAZEPAM 2 MG/ML IJ SOLN
0.5000 mg | Freq: Once | INTRAMUSCULAR | Status: AC
  Administered 2013-05-17: 0.5 mg via INTRAVENOUS
  Filled 2013-05-17: qty 1

## 2013-05-17 MED ORDER — SODIUM CHLORIDE 0.9 % IV SOLN
INTRAVENOUS | Status: DC
  Administered 2013-05-17: 23:00:00 via INTRAVENOUS

## 2013-05-17 MED ORDER — HYDROCODONE-ACETAMINOPHEN 5-325 MG PO TABS
1.0000 | ORAL_TABLET | Freq: Once | ORAL | Status: AC
  Administered 2013-05-17: 1 via ORAL
  Filled 2013-05-17: qty 1
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
## 2013-05-18 MED ORDER — VITAMIN D (ERGOCALCIFEROL) 1.25 MG (50000 UNIT) PO CAPS
50000.0000 [IU] | ORAL_CAPSULE | ORAL | Status: DC
  Filled 2013-05-18: qty 1

## 2013-05-18 MED ORDER — ACETAMINOPHEN 325 MG PO TABS
650.0000 mg | ORAL_TABLET | Freq: Once | ORAL | Status: AC
  Administered 2013-05-18: 650 mg via ORAL

## 2013-05-18 MED ORDER — CALCITONIN (SALMON) 200 UNIT/ACT NA SOLN
1.0000 | Freq: Every day | NASAL | Status: DC
  Administered 2013-05-20 – 2013-05-21 (×2): 1 via NASAL
  Filled 2013-05-18 (×2): qty 3.7

## 2013-05-18 MED ORDER — ATORVASTATIN CALCIUM 10 MG PO TABS
10.0000 mg | ORAL_TABLET | Freq: Every day | ORAL | Status: DC
  Administered 2013-05-19 – 2013-05-20 (×2): 10 mg via ORAL
  Filled 2013-05-18 (×3): qty 1

## 2013-05-18 MED ORDER — QUETIAPINE FUMARATE 25 MG PO TABS
25.0000 mg | ORAL_TABLET | Freq: Every day | ORAL | Status: DC
  Administered 2013-05-19 – 2013-05-21 (×3): 25 mg via ORAL
  Filled 2013-05-18 (×6): qty 1

## 2013-05-18 MED ORDER — CLONAZEPAM 0.5 MG PO TABS
0.5000 mg | ORAL_TABLET | Freq: Once | ORAL | Status: AC
  Administered 2013-05-18: 0.5 mg via ORAL
  Filled 2013-05-18: qty 1

## 2013-05-18 MED ORDER — LEVOTHYROXINE SODIUM 125 MCG PO TABS
125.0000 ug | ORAL_TABLET | Freq: Every day | ORAL | Status: DC
  Administered 2013-05-19 – 2013-05-21 (×3): 125 ug via ORAL
  Filled 2013-05-18 (×8): qty 1

## 2013-05-18 MED ORDER — HYDROCODONE-ACETAMINOPHEN 5-325 MG PO TABS
ORAL_TABLET | ORAL | Status: AC
  Filled 2013-05-18: qty 2

## 2013-05-18 MED ORDER — PAROXETINE HCL 20 MG PO TABS
ORAL_TABLET | ORAL | Status: AC
  Filled 2013-05-18: qty 2

## 2013-05-18 MED ORDER — FLUTICASONE PROPIONATE 50 MCG/ACT NA SUSP
2.0000 | Freq: Every morning | NASAL | Status: DC
  Administered 2013-05-19 – 2013-05-21 (×3): 2 via NASAL
  Filled 2013-05-18 (×2): qty 16

## 2013-05-18 MED ORDER — QUETIAPINE FUMARATE 50 MG PO TABS
50.0000 mg | ORAL_TABLET | Freq: Once | ORAL | Status: AC
  Administered 2013-05-18: 50 mg via ORAL
  Filled 2013-05-18: qty 1

## 2013-05-18 MED ORDER — BENAZEPRIL HCL 5 MG PO TABS
ORAL_TABLET | ORAL | Status: AC
  Filled 2013-05-18: qty 4

## 2013-05-18 MED ORDER — LORATADINE 10 MG PO TABS
10.0000 mg | ORAL_TABLET | Freq: Every morning | ORAL | Status: DC
  Administered 2013-05-19 – 2013-05-21 (×3): 10 mg via ORAL
  Filled 2013-05-18 (×3): qty 1

## 2013-05-18 MED ORDER — PAROXETINE HCL 10 MG PO TABS
ORAL_TABLET | ORAL | Status: AC
  Filled 2013-05-18: qty 3

## 2013-05-18 MED ORDER — BENAZEPRIL HCL 10 MG PO TABS
20.0000 mg | ORAL_TABLET | Freq: Two times a day (BID) | ORAL | Status: DC
  Administered 2013-05-18 – 2013-05-21 (×6): 20 mg via ORAL
  Filled 2013-05-18: qty 2
  Filled 2013-05-18 (×3): qty 1
  Filled 2013-05-18 (×2): qty 2
  Filled 2013-05-18: qty 1
  Filled 2013-05-18: qty 2

## 2013-05-18 MED ORDER — PAROXETINE HCL 10 MG PO TABS
30.0000 mg | ORAL_TABLET | Freq: Once | ORAL | Status: AC
  Administered 2013-05-18: 30 mg via ORAL
  Filled 2013-05-18: qty 3

## 2013-05-18 MED ORDER — DM-GUAIFENESIN ER 30-600 MG PO TB12
1.0000 | ORAL_TABLET | Freq: Two times a day (BID) | ORAL | Status: DC
  Administered 2013-05-18 – 2013-05-21 (×6): 1 via ORAL
  Filled 2013-05-18 (×6): qty 1

## 2013-05-18 MED ORDER — PAROXETINE HCL 20 MG PO TABS
40.0000 mg | ORAL_TABLET | Freq: Every day | ORAL | Status: DC
Start: 2013-05-18 — End: 2013-05-21
  Administered 2013-05-18 – 2013-05-20 (×3): 40 mg via ORAL
  Filled 2013-05-18 (×4): qty 2

## 2013-05-18 MED ORDER — PAROXETINE HCL 30 MG PO TABS: 30.0000 mg | ORAL_TABLET | Freq: Every day | ORAL | Status: DC

## 2013-05-18 MED ORDER — CLONAZEPAM 0.5 MG PO TABS
0.5000 mg | ORAL_TABLET | Freq: Three times a day (TID) | ORAL | Status: DC | PRN
  Administered 2013-05-19: 0.5 mg via ORAL

## 2013-05-18 MED ORDER — QUETIAPINE FUMARATE 25 MG PO TABS
ORAL_TABLET | ORAL | Status: AC
  Filled 2013-05-18: qty 2

## 2013-05-18 MED ORDER — DULOXETINE HCL 30 MG PO CPEP
30.0000 mg | ORAL_CAPSULE | Freq: Every day | ORAL | Status: DC
  Administered 2013-05-19 – 2013-05-21 (×3): 30 mg via ORAL
  Filled 2013-05-18 (×6): qty 1

## 2013-05-18 MED ORDER — ACETAMINOPHEN 325 MG PO TABS
ORAL_TABLET | ORAL | Status: AC
  Filled 2013-05-18: qty 2

## 2013-05-18 MED ORDER — CYANOCOBALAMIN 1000 MCG/ML IJ SOLN: 1000.0000 ug | INTRAMUSCULAR | Status: DC

## 2013-05-18 MED ORDER — ONDANSETRON 4 MG PO TBDP
ORAL_TABLET | ORAL | Status: AC
  Filled 2013-05-18: qty 1

## 2013-05-18 MED ORDER — HYDROCODONE-ACETAMINOPHEN 5-325 MG PO TABS
2.0000 | ORAL_TABLET | Freq: Once | ORAL | Status: AC
  Administered 2013-05-18: 2 via ORAL

## 2013-05-18 MED ORDER — ALBUTEROL SULFATE HFA 108 (90 BASE) MCG/ACT IN AERS
1.0000 | INHALATION_SPRAY | RESPIRATORY_TRACT | Status: DC
  Administered 2013-05-18 – 2013-05-19 (×4): 2 via RESPIRATORY_TRACT
  Filled 2013-05-18: qty 6.7

## 2013-05-18 MED ORDER — QUETIAPINE FUMARATE 50 MG PO TABS
50.0000 mg | ORAL_TABLET | Freq: Every day | ORAL | Status: DC
  Administered 2013-05-18 – 2013-05-20 (×3): 50 mg via ORAL
  Filled 2013-05-18 (×3): qty 2

## 2013-05-18 MED ORDER — ONDANSETRON 4 MG PO TBDP
4.0000 mg | ORAL_TABLET | Freq: Once | ORAL | Status: AC
  Administered 2013-05-18: 4 mg via ORAL

## 2013-05-18 NOTE — Clinical Social Work Placement (Signed)
Clinical Social Work Department CLINICAL SOCIAL WORK PLACEMENT NOTE 05/18/2013  Patient:  Alexis Reese, Alexis Reese  Account Number:  [id number] Admit date:  05/17/2013  Clinical Social Worker:  Benay Pike, LCSW  Date/time:  05/18/2013 03:19 PM  Clinical Social Work is seeking post-discharge placement for this patient at the following level of care:   [place]   (*CSW will update this form in Epic as items are completed)   05/18/2013  Patient/family provided with [place] Department of Clinical Social Work's list of facilities offering this level of care within the geographic area requested by the patient (or if unable, by the patient's family).  05/18/2013  Patient/family informed of their freedom to choose among providers that offer the needed level of care, that participate in Medicare, Medicaid or managed care program needed by the patient, have an available bed and are willing to accept the patient.  05/18/2013  Patient/family informed of [facility]' ownership interest in [facility], as well as of the fact that they are under no obligation to receive care at this facility.  PASARR submitted to EDS on 05/18/2013 PASARR number received from EDS on   FL2 transmitted to all facilities in geographic area requested by pt/family on  05/18/2013 FL2 transmitted to all facilities within larger geographic area on 05/18/2013  Patient informed that his/her managed care company has contracts with or will negotiate with  certain facilities, including the following:     Patient/family informed of bed offers received:   Patient chooses bed at  Physician recommends and patient chooses bed at    Patient to be transferred to  on   Patient to be transferred to facility by   The following physician request were entered in Epic:   Additional Comments:  Benay Pike, [place]

## 2013-05-18 NOTE — ED Notes (Signed)
Pt still asleep in room.
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
## 2013-05-18 NOTE — ED Notes (Signed)
Spoke with Dr. Alvino Chapel who stated that patient was to stay in ED and wait for social worker/case worker to consult with patient in the a.m. Brother whom patient lives with states he cannot care for patient at home. Spoke with Lorriane Shire from patient's home health care and updated her on status.

## 2013-05-18 NOTE — ED Notes (Signed)
Alexis Reese made a note stating pt would be a inpatient psych admission. Social Worker called and stated [facility] is attempting to place pt at [name].

## 2013-05-18 NOTE — ED Notes (Signed)
Patient complaining of back pain and nausea. Dr. Stark Jock notified. Orders placed for zofran and 2 of norco.

## 2013-05-18 NOTE — Clinical Social Work Psychosocial (Deleted)
Clinical Social Work Department BRIEF PSYCHOSOCIAL ASSESSMENT 05/18/2013  Patient:  Alexis Reese, Alexis Reese     Account Number:  [id number]     Admit date:  04/23/2005  Clinical Social Worker:  Wyatt Haste  Date/Time:  05/18/2013 02:50 PM  Referred by:  Physician  Date Referred:  05/18/2013 Referred for  SNF Placement   Other Referral:   Interview type:  Family Other interview type:   brother- John    PSYCHOSOCIAL DATA Living Status:  FAMILY Admitted from facility:   Level of care:   Primary support name:  John Primary support relationship to patient:  SIBLING Degree of support available:   supportive, but experiencing caregiver fatigue    CURRENT CONCERNS Current Concerns  Post-Acute Placement   Other Concerns:    SOCIAL WORK ASSESSMENT / PLAN CSW referred this morning for placement from ED. Came to ED due to altered mental status. Pt was feeling anxious and also felt that people were trying to beat her up. Pt has been medically cleared and family is unable to continue providing care. Pt has been living with her brother for the past 85 years since her husband died. Pt's son also lives there, but is morbidly obese. Brother is primary caregiver. He indicates that pt requires a lot of assistance at home. She is able to ambulate with a walker, but stays in bed most of the time. She needs help getting to [facility] and with toileting. He describes helping her with all ADLs. Pt has had poor appetite recently. Threasa Beards [phone] ext [phone]), caseworker at [facility] has open case and began working with pt and family this week to assist in placement from home as brother is unable to continue to provide extensive care. He appears to be completely worn out from her care needs. [facility]anie reports [facility] hosp[facility] was set up this week and pt was felt to have prognosis of < 6 months. Threasa Beards is now planning to petition for guardianship for pt. Pt is oriented to self only. Her brother feels she has dementia, although  EDP does not feel comfortable giving this diagnosis without knowing pt. Brother reports pt gets agitated at home and sometimes does not recognize him. She gets agitated when someone comes in to bathe her and recently has made comments that people are trying to hurt her. CSW discussed placement with brother and Threasa Beards. Both are in favor of immediate placement from ED. They are aware that pt does not have skilled need at this point for [facility] and will need to be placed Medicaid only. CSW completed pasarr and pt was assigned to [facility]. Face to face evaluation completed this afternoon and recommendation is for psych consult due to psychosis. EDP notified and has placed order. CSW has left voicemail to discuss with DSS. Bed offer at [facility] so far with Medicaid. Brother and Melanie aware.   Assessment/plan status:  Psychosocial Support/Ongoing Assessment of Needs Other assessment/ plan:   Information/referral to community resources:   DSS  psych consult  SNF list    PATIENT'S/FAMILY'S RESPONSE TO PLAN OF CARE: Pt unable to discuss plan of care due to mental status. Awaiting psych consult for further recommendations. CSW will update pasarr after complete and will continue working on SNF.       Benay Pike, [place]

## 2013-05-18 NOTE — ED Notes (Signed)
Pt completed tele-psych in room. Pt was able to answer most questions.

## 2013-05-18 NOTE — ED Notes (Signed)
Patient fed meal tray. Tolerating soft foods and fluids well.

## 2013-05-18 NOTE — Clinical Social Work Psychosocial (Signed)
Clinical Social Work Department BRIEF PSYCHOSOCIAL ASSESSMENT 05/18/2013  Patient:  Alexis Reese, Alexis Reese     Account Number:  [id number]     Admit date:  05/17/2013  Clinical Social Worker:  Wyatt Haste  Date/Time:  05/18/2013 03:16 PM  Referred by:  Physician  Date Referred:  05/18/2013 Referred for  SNF Placement   Other Referral:   Interview type:  Family Other interview type:   brother- John    PSYCHOSOCIAL DATA Living Status:  FAMILY Admitted from facility:   Level of care:   Primary support name:  John Primary support relationship to patient:  SIBLING Degree of support available:   supportive, but experiencing caregiver fatigue    CURRENT CONCERNS Current Concerns  Post-Acute Placement   Other Concerns:    SOCIAL WORK ASSESSMENT / PLAN CSW referred this morning for placement from ED. Came to ED due to altered mental status. Pt was feeling anxious and also felt that people were trying to beat her up. Pt has been medically cleared and family is unable to continue providing care. Pt has been living with her brother for the past 51 years since her husband died. Pt's son also lives there, but is morbidly obese. Brother is primary caregiver. He indicates that pt requires a lot of assistance at home. She is able to ambulate with a walker, but stays in bed most of the time. She needs help getting to [facility] and with toileting. He describes helping her with all ADLs. Pt has had poor appetite recently. Threasa Beards [phone] ext [phone]), caseworker at [facility] has open case and began working with pt and family this week to assist in placement from home as brother is unable to continue to provide extensive care. He appears to be completely worn out from her care needs. Melanie reports [facility] hosp[facility] was set up this week and pt was felt to have prognosis of < 6 months. Threasa Beards is now planning to petition for guardianship for pt. Pt is oriented to self only. Her brother feels she has dementia, although  EDP does not feel comfortable giving this diagnosis without knowing pt. Brother reports pt gets agitated at home and sometimes does not recognize him. She gets agitated when someone comes in to bathe her and recently has made comments that people are trying to hurt her. CSW discussed placement with brother and Threasa Beards. Both are in favor of immediate placement from ED. They are aware that pt does not have skilled need at this point for [facility] and will need to be placed Medicaid only. CSW completed pasarr and pt was assigned to [facility]. Face to face evaluation completed this afternoon and recommendation is for psych consult due to psychosis. EDP notified and has placed order. CSW has left voicemail to discuss with DSS. Bed offer at [facility] so far with Medicaid. Brother and Melanie aware.   Assessment/plan status:  Psychosocial Support/Ongoing Assessment of Needs Other assessment/ plan:   Information/referral to community resources:   DSS  SNF list  psych consult    PATIENT'S/FAMILY'S RESPONSE TO PLAN OF CARE: Pt unable to discuss plan of care due to mental status. Awaiting psych consult for further recommendations. CSW will update pasarr after complete and will continue working on SNF.       Benay Pike, [place]

## 2013-05-18 NOTE — ED Notes (Signed)
Family w/ pt at this time.

## 2013-05-18 NOTE — ED Notes (Signed)
PASSR evaluation completed by Colletta Maryland

## 2013-05-18 NOTE — ED Provider Notes (Addendum)
Social worker trying to place pt in SNF from ED.  PASSR evaluated completed by Colletta Maryland: they are requesting psych eval, as pt "appears to be psychotic" (ie: "feels people are after her"). TTS eval pending.   Alfonzo Feller, DO 05/18/13 [place], DO 05/18/13 1850
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
## 2013-05-18 NOTE — ED Notes (Signed)
Pt provided drink/snack at this time. 

## 2013-05-18 NOTE — ED Notes (Signed)
Benay Pike in the Dept to consult.

## 2013-05-18 NOTE — BHH Counselor (Signed)
Writer spoke w/ pt's RN Dorothea Ogle re: TTS consult. Dorothea Ogle says that he needs to transport a pt to a medical floor and he will call writer back when he puts telepsych machine in room.  Arnold Long, [place] Assessment Counselor

## 2013-05-19 DIAGNOSIS — F0390 Unspecified dementia without behavioral disturbance: Secondary | ICD-10-CM

## 2013-05-19 MED ORDER — ALBUTEROL SULFATE (2.5 MG/3ML) 0.083% IN NEBU: 3.0000 mL | INHALATION_SOLUTION | RESPIRATORY_TRACT | Status: DC | PRN

## 2013-05-19 MED ORDER — CLONAZEPAM 0.5 MG PO TABS
ORAL_TABLET | ORAL | Status: AC
  Filled 2013-05-19: qty 1

## 2013-05-19 MED ORDER — POLYMYXIN B-TRIMETHOPRIM 10000-0.1 UNIT/ML-% OP SOLN
1.0000 [drp] | OPHTHALMIC | Status: DC
  Administered 2013-05-19 – 2013-05-21 (×10): 1 [drp] via OPHTHALMIC
  Filled 2013-05-19: qty 10

## 2013-05-19 MED ORDER — CYANOCOBALAMIN 1000 MCG/ML IJ SOLN: 1000.0000 ug | INTRAMUSCULAR | Status: DC

## 2013-05-19 MED ORDER — BENAZEPRIL HCL 5 MG PO TABS
ORAL_TABLET | ORAL | Status: AC
  Filled 2013-05-19: qty 1

## 2013-05-19 MED ORDER — BENAZEPRIL HCL 5 MG PO TABS
ORAL_TABLET | ORAL | Status: AC
  Filled 2013-05-19: qty 4

## 2013-05-19 MED ORDER — QUETIAPINE FUMARATE 25 MG PO TABS
ORAL_TABLET | ORAL | Status: AC
  Filled 2013-05-19: qty 1

## 2013-05-19 MED ORDER — VITAMIN D (ERGOCALCIFEROL) 1.25 MG (50000 UNIT) PO CAPS: 50000.0000 [IU] | ORAL_CAPSULE | ORAL | Status: DC

## 2013-05-19 NOTE — ED Notes (Signed)
Repeat telepsych done. Pt assisted back to bed
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
## 2013-05-19 NOTE — ED Notes (Signed)
Pt assisted to bedside chair. Given bath by tech. Pt whining. States she does not feel good but will not say why she doesn't feel good. Pt inst she would set in the chair a while to prevent pneumonia

## 2013-05-19 NOTE — Clinical Social Work Note (Signed)
Noted recommendation for geropsych. Melanie called from [place] and they have been named guardian. Updated supervisor on situation and notified ED and [facility] that SNF is on hold as pasarr cannot be completed for pt to go to SNF with geropsych recommendation. Colletta [place] with pasarr confirms above. Sharyn Lull at [facility] states she will begin working on referral with this information. Melanie updated.   Alexis Reese, [place]

## 2013-05-19 NOTE — ED Notes (Signed)
Pt dozed off and when she woke up she was confused at to where she was. Pt made aware she was in the hospital.

## 2013-05-19 NOTE — ED Notes (Signed)
Lunch tray given to pt. Pt whining states she can;t eat that food because she don't like it. Pt encouraged to eat something

## 2013-05-19 NOTE — ED Notes (Signed)
Patient was assisted up from bed and into chair.  Patient repeatedly ask why she was here.  Advised patient she was in E.D.  Advised patient that once she sat in chair we would bring dinner tray to her.

## 2013-05-19 NOTE — BHH Counselor (Addendum)
Telepsych completed on this patient today by Guadelupe Sabin, NP. He recommended placement for pt in SNF or other facility equipped to care for Alzheimers/Dementia patient. Writer has updated LCSW-Kara [phone] of patient's disposition. Per Marcene Brawn, patient will more likely remain in the ED over the weekend before placement.

## 2013-05-19 NOTE — Clinical Social Work Note (Signed)
Psych now clearing pt. Updated [facility] and asked if pt came to respite hospice bed if pasarr would be required and they confirm that it would. Faxed both psych assessments to [facility] for review and confirmed receipt. Spoke with pasarr who feel it will be Monday before pasarr is received. Per Threasa Beards at [facility], it is not safe to d/c pt home to brother. Pt to remain in ED through Monday and CSW will follow up for placement then.  Alexis Reese, [place]

## 2013-05-19 NOTE — ED Notes (Signed)
Call from  [place] at [facility] states they are looking for placement at a Geropsych facility and social work is attempting to find placement in a skilled nursing facility

## 2013-05-19 NOTE — ED Notes (Signed)
Pt resting calmly w/ eyes closed. Rise & fall of the chest noted. Bed in low position, side rails up x2. NAD noted at this time.  

## 2013-05-19 NOTE — ED Notes (Signed)
Pt's son here to visit

## 2013-05-19 NOTE — ED Notes (Signed)
[facility] called and stated that pt would have another telepsych to see if pt may be cleared for psychiatric placement. Son states that he and his uncle are no longer to care for pt at home due to advanced alzheimer's and pt not being able to get around well.

## 2013-05-19 NOTE — ED Notes (Signed)
Pt sleeping. Pt's brother called to check on pt and to see what the plan of care was for the pt.

## 2013-05-20 ENCOUNTER — Emergency Department (HOSPITAL_COMMUNITY): Payer: Medicare Other

## 2013-05-20 LAB — LACTIC ACID, PLASMA: LACTIC ACID, VENOUS: 1 mmol/L (ref 0.5–2.2)

## 2013-05-20 LAB — TROPONIN I: Troponin I: <0.3 ng/mL (ref <0.30)

## 2013-05-20 MED ORDER — SODIUM CHLORIDE 0.9 % IJ SOLN
INTRAMUSCULAR | Status: AC
  Filled 2013-05-20: qty 50

## 2013-05-20 MED ORDER — ALBUTEROL SULFATE (2.5 MG/3ML) 0.083% IN NEBU: 3.0000 mL | INHALATION_SOLUTION | RESPIRATORY_TRACT | Status: DC | PRN

## 2013-05-20 MED ORDER — HYDROCODONE-ACETAMINOPHEN 5-325 MG PO TABS
1.0000 | ORAL_TABLET | Freq: Once | ORAL | Status: AC
  Administered 2013-05-20: 1 via ORAL
  Filled 2013-05-20: qty 1

## 2013-05-20 MED ORDER — IOHEXOL 350 MG/ML SOLN
100.0000 mL | Freq: Once | INTRAVENOUS | Status: AC | PRN
  Administered 2013-05-20: 100 mL via INTRAVENOUS

## 2013-05-20 MED ORDER — MORPHINE SULFATE 4 MG/ML IJ SOLN
4.0000 mg | Freq: Once | INTRAMUSCULAR | Status: AC
  Administered 2013-05-20: 4 mg via INTRAVENOUS
  Filled 2013-05-20: qty 1

## 2013-05-20 MED ORDER — LORAZEPAM 1 MG PO TABS
1.0000 mg | ORAL_TABLET | ORAL | Status: DC | PRN
  Administered 2013-05-20 – 2013-05-21 (×2): 1 mg via ORAL
  Filled 2013-05-20 (×2): qty 1

## 2013-05-20 NOTE — ED Notes (Addendum)
Assisted patient to bedside commode x2 assist

## 2013-05-20 NOTE — ED Notes (Signed)
Pt assisted to chair to eat lunch. Pt wanted to stay in bed instead of getting up. Pt informed she could stay in the bed all the time or she would have pneumonia

## 2013-05-20 NOTE — ED Provider Notes (Addendum)
Alexis Reese's been somewhat agitated and not sleeping in spite of taking her clonazepam. Alexis Reese's given lorazepam with some improvement. Alexis Reese started complaining of pain every where including her chest and back. ECG was obtained and is unremarkable.  EKG Interpretation    Date/Time:  Saturday May 20 2013 03:00:58 EST Ventricular Rate:  86 PR Interval:  158 QRS Duration: 78 QT Interval:  394 QTC Calculation: 471 R Axis:   9 Text Interpretation:  Normal sinus rhythm Possible Inferior infarct , age undetermined Abnormal ECG When compared with ECG of 03-May-2013 22:10, Borderline criteria for Inferior infarct are now Present Confirmed by [facility]  MD, Shields Pautz ([id number]) on 05/20/2013 3:12:58 AM              Delora Fuel, MD [id number]  Alexis Reese continues to complain of chest pain radiating through to the back. Alexis Reese appears mildly uncomfortable. Lungs are clear and heart has regular rate and rhythm. Abdomen is soft and nontender. Old records are reviewed and Alexis Reese has no advanced imaging of her chest, but abdominal CT scans and ultrasounds failed to show evidence of abdominal aortic aneurysm. Alexis Reese'll be given morphine for pain and be sent for CT angiogram to rule out PE and dissection.  Delora Fuel, MD [id number]  Lactic acid level is normal, troponin is normal, and CT angiogram is unremarkable. Pain will need to be treated symptomatically.  Results for orders placed during the hospital encounter of 05/17/13  CBC WITH DIFFERENTIAL      Result Value Ref Range   WBC 8.1  4.0 - 10.5 K/uL   RBC 4.55  3.87 - 5.11 MIL/uL   Hemoglobin 13.9  12.0 - 15.0 g/dL   HCT 40.8  36.0 - 46.0 %   MCV 89.7  78.0 - 100.0 fL   MCH 30.5  26.0 - 34.0 pg   MCHC 34.1  30.0 - 36.0 g/dL   RDW 12.2  11.5 - 15.5 %   Platelets 252  150 - 400 K/uL   Neutrophils Relative % 78 (*) 43 - 77 %   Neutro Abs 6.3  1.7 - 7.7 K/uL   Lymphocytes Relative 12  12 - 46 %   Lymphs Abs 1.0  0.7 - 4.0 K/uL   Monocytes Relative 9  3 - 12 %   Monocytes Absolute 0.8  0.1 - 1.0 K/uL   Eosinophils Relative 0  0 - 5 %   Eosinophils Absolute 0.0  0.0 - 0.7 K/uL   Basophils Relative 1  0 - 1 %   Basophils Absolute 0.0  0.0 - 0.1 K/uL  COMPREHENSIVE METABOLIC PANEL      Result Value Ref Range   Sodium 134 (*) 137 - 147 mEq/L   Potassium 4.2  3.7 - 5.3 mEq/L   Chloride 94 (*) 96 - 112 mEq/L   CO2 28  19 - 32 mEq/L   Glucose, Bld 183 (*) 70 - 99 mg/dL   BUN 6  6 - 23 mg/dL   Creatinine, Ser 0.77  0.50 - 1.10 mg/dL   Calcium 9.4  8.4 - 10.5 mg/dL   Total Protein 7.5  6.0 - 8.3 g/dL   Albumin 3.6  3.5 - 5.2 g/dL   AST 26  0 - 37 U/L   ALT 20  0 - 35 U/L   Alkaline Phosphatase 66  39 - 117 U/L   Total Bilirubin 0.5  0.3 - 1.2 mg/dL   GFR calc non Af Amer 80 (*) >90 mL/min   GFR  calc Af Amer >90  >90 mL/min  URINALYSIS, ROUTINE W REFLEX MICROSCOPIC      Result Value Ref Range   Color, Urine YELLOW  YELLOW   APPearance CLEAR  CLEAR   Specific Gravity, Urine 1.015  1.005 - 1.030   pH 6.5  5.0 - 8.0   Glucose, UA NEGATIVE  NEGATIVE mg/dL   Hgb urine dipstick NEGATIVE  NEGATIVE   Bilirubin Urine NEGATIVE  NEGATIVE   Ketones, ur NEGATIVE  NEGATIVE mg/dL   Protein, ur NEGATIVE  NEGATIVE mg/dL   Urobilinogen, UA 1.0  0.0 - 1.0 mg/dL   Nitrite NEGATIVE  NEGATIVE   Leukocytes, UA NEGATIVE  NEGATIVE  TROPONIN I      Result Value Ref Range   Troponin I <0.30  <0.30 ng/mL  LACTIC ACID, PLASMA      Result Value Ref Range   Lactic Acid, Venous 1.0  0.5 - 2.2 mmol/L   Ct Angio Chest Pe W/cm &/or Wo Cm  05/20/2013   CLINICAL DATA:  Chest pain and upper mid back pain and weakness. Concern for aortic dissection.  EXAM: CT ANGIOGRAPHY CHEST WITH CONTRAST  TECHNIQUE: Multidetector CT imaging of the chest was performed using the standard protocol during bolus administration of intravenous contrast. Multiplanar CT image reconstructions and MIPs were obtained to evaluate the vascular anatomy.  CONTRAST:  142mL OMNIPAQUE IOHEXOL 350 MG/ML SOLN   COMPARISON:  DG CHEST 1V PORT dated 05/17/2013; MR T SPINE WO/W CM dated 10/12/2009; CT T SPINE W/O CM dated 11/05/2008  FINDINGS: Noncontrast images are without evidence of thoracic aortic intramural hematoma. The thoracic aorta is normal in caliber. There is no evidence of dissection. There is no significant atherosclerotic disease of the thoracic aorta. There is a 3 vessel aortic arch. The proximal common carotid arteries follow a medial course posterior to the larynx and hypopharynx. The main pulmonary artery is enlarged, measuring 3.5 cm in diameter. Mild LAD coronary artery calcification is noted.  There is trace right pleural fluid versus posterior pleural thickening. There is no pericardial effusion. Multiple surgical clips are present in the right axilla the patient is status post right mastectomy. No enlarged axillary, mediastinal, or hilar lymph nodes are identified.  Evaluation of the lung parenchyma is mildly limited by respiratory motion. Minimal biapical scarring is present. Additional linear opacities in the right lung at the level of hilum may also represent scarring. There is evidence of mild volume loss in the right lung, and scattered, patchy ground-glass opacities are present in the right lung.  9 mm hypodensity in the posterior right hepatic lobe likely represents a cyst. 2.6 cm lesion in the lateral left hepatic lobe demonstrates discontinuous peripheral nodular enhancement and is compatible with a hemangioma. A 1 cm cyst is noted in the posterior left kidney.  Sclerotic focus in the T3 vertebral body likely represents a bone island. Mild to moderate compression deformities of T7 and T8 are unchanged from prior CT severe T12 compression fracture is unchanged from prior MRI, as is moderate L2 compression fracture.  Review of the MIP images confirms the above findings.  IMPRESSION: 1. Unremarkable appearance of the thoracic aorta. No evidence of dissection. 2. Enlargement of the main pulmonary  artery, which is nonspecific but can be seen in the setting of pulmonary arterial hypertension. 3. Trace right pleural fluid versus pleural thickening. 4. Mild scarring, volume loss, and patchy ground-glass opacities in the right lung. Query prior radiation therapy. 5. Unchanged thoracic compression fractures.   Electronically Signed  By: Logan Bores   On: 05/20/2013 06:33  Images viewed by me.   Delora Fuel, MD 17/49/44 [id number]
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
## 2013-05-21 MED ORDER — PAROXETINE HCL 40 MG PO TABS: 20.0000 mg | ORAL_TABLET | Freq: Every day | ORAL | Status: DC

## 2013-05-21 MED ORDER — CLONAZEPAM 0.5 MG PO TABS: 0.5000 mg | ORAL_TABLET | Freq: Three times a day (TID) | ORAL | Status: DC | PRN

## 2013-05-21 MED ORDER — QUETIAPINE FUMARATE 50 MG PO TABS: 25.0000 mg | ORAL_TABLET | Freq: Two times a day (BID) | ORAL | Status: DC

## 2013-05-21 NOTE — Discharge Instructions (Signed)
Please be sure to discuss your medical history with your new physician upon arrival to your new residence.  Return here for concerning changes in your condition.

## 2013-05-21 NOTE — Progress Notes (Signed)
Clinical Education officer, museum (CSW) received call from weekday [place] stating that patient's PASARR number was assigned today. CSW contacted Orbie Hurst at [facility] who reported that they could accept patient today. Vania Rea reported that patient would be in room [place] faxed D/C paper work including AVS and current medication list to [place] at [facility]. CSW faxed updated FL2 with [facility] number on it to RN at [facility]. CSW reported to RN to send signed FL2, hard prescriptions for narcotics or psychotropics, all the notes in the chart, and a face sheet. RN will arrange for EMS transport. CSW made patient's brother Tobin Chad aware of above. Please reconslt if further social work needs arise. CSW signing off.   Blima Rich, [place] Weekend CSW [phone]

## 2013-05-21 NOTE — ED Notes (Signed)
RN spoke with [place]. CCOM to call [facility] for transport.

## 2013-05-21 NOTE — ED Notes (Signed)
Patient transported to [facility] in [place] by [place].

## 2013-05-21 NOTE — ED Notes (Signed)
RN called report to [place] at [facility].  Secretary called [facility] for transport.

## 2013-05-21 NOTE — ED Notes (Signed)
Pt transferred self from chair back to bed.

## 2013-05-21 NOTE — ED Notes (Signed)
Pt brother Vanita Panda notified.

## 2013-05-21 NOTE — ED Notes (Signed)
Pt very anxious and tearful.  Ativan given per PRN order.

## 2013-05-21 NOTE — ED Notes (Signed)
Patient yelling out and tearful at this time. Asking if she can go in other room. Explained to her that she is in the Hospital and can leave at this time. Assisted her to bedside commode.

## 2013-05-21 NOTE — ED Notes (Signed)
Spoke with Mel Almond ([facility]) [phone]. Pt to be admitted to [facility] in [place]. Report to be called to [place] at [phone]. D/c summary, avs with med list, signed FL2, and hard copies of narcotic and psychotropic prescriptions needed prior to transport.

## 2013-05-21 NOTE — ED Provider Notes (Signed)
Patient sleeping.  VSS  Patient accepted to [facility] in [place] ([place]).  D/C pending  Carmin Muskrat, MD 05/21/13 1626

## 2013-05-25 ENCOUNTER — Non-Acute Institutional Stay (SKILLED_NURSING_FACILITY): Payer: Medicare Other | Admitting: Internal Medicine

## 2013-05-25 DIAGNOSIS — F0391 Unspecified dementia with behavioral disturbance: Secondary | ICD-10-CM

## 2013-05-25 DIAGNOSIS — R110 Nausea: Secondary | ICD-10-CM

## 2013-05-25 DIAGNOSIS — E538 Deficiency of other specified B group vitamins: Secondary | ICD-10-CM

## 2013-05-25 DIAGNOSIS — I10 Essential (primary) hypertension: Secondary | ICD-10-CM

## 2013-05-25 DIAGNOSIS — F419 Anxiety disorder, unspecified: Secondary | ICD-10-CM

## 2013-05-25 DIAGNOSIS — E119 Type 2 diabetes mellitus without complications: Secondary | ICD-10-CM

## 2013-05-25 DIAGNOSIS — F329 Major depressive disorder, single episode, unspecified: Secondary | ICD-10-CM

## 2013-05-25 DIAGNOSIS — F341 Dysthymic disorder: Secondary | ICD-10-CM

## 2013-05-25 DIAGNOSIS — F03918 Unspecified dementia, unspecified severity, with other behavioral disturbance: Secondary | ICD-10-CM

## 2013-05-28 DIAGNOSIS — F419 Anxiety disorder, unspecified: Secondary | ICD-10-CM

## 2013-05-28 DIAGNOSIS — F329 Major depressive disorder, single episode, unspecified: Secondary | ICD-10-CM | POA: Insufficient documentation

## 2013-05-28 DIAGNOSIS — E538 Deficiency of other specified B group vitamins: Secondary | ICD-10-CM | POA: Insufficient documentation

## 2013-05-28 DIAGNOSIS — F03918 Unspecified dementia, unspecified severity, with other behavioral disturbance: Secondary | ICD-10-CM | POA: Insufficient documentation

## 2013-05-28 DIAGNOSIS — R110 Nausea: Secondary | ICD-10-CM | POA: Insufficient documentation

## 2013-05-28 DIAGNOSIS — I10 Essential (primary) hypertension: Secondary | ICD-10-CM | POA: Insufficient documentation

## 2013-05-28 DIAGNOSIS — F32A Depression, unspecified: Secondary | ICD-10-CM | POA: Insufficient documentation

## 2013-05-28 DIAGNOSIS — F0391 Unspecified dementia with behavioral disturbance: Secondary | ICD-10-CM | POA: Insufficient documentation

## 2013-05-28 NOTE — Progress Notes (Signed)
Patient ID: Alexis Reese, female   DOB: [birth year], 79 y.o.   MRN: [id number]    [name] living [place]    PCP: Sherrie Mustache, MD  No Known Allergies  Chief Complaint: new admission  HPI:  79 y/o female patient is here from ED. She was in the ED with chest pain and had CT chest to rule out embolism and aortic dissection. They were ruled out. She was then in the ED for several hours and now in SNF for STR. She is anxious. She is under hospice care and hospice nurse is present at her bedside. She complaints of eating more than usual in her lunch and now feels uneasy in her abdomen with nausea present. No other complaints. She has history of dm, HTN, constipation, recurrent UTI and dementia  Review of Systems:  Constitutional: Negative for fever, chills, diaphoresis.  HENT: Negative for congestion, hearing loss and sore throat.   Eyes: Negative for blurred vision, double vision and discharge.  Respiratory: Negative for cough, sputum production, shortness of breath and wheezing.   Cardiovascular: Negative for chest pain, palpitations and leg swelling.  Gastrointestinal: Negative for vomiting, diarrhea and constipation.  Genitourinary: has urinary incontinence Musculoskeletal: Negative for falls, joint pain and myalgias.  Skin: Negative for itching and rash.  Neurological: Positive for weakness. Negative for dizziness, tingling, focal weakness and headaches.  Psychiatric/Behavioral: has memory loss. The patient is anxious.     Past Medical History  Diagnosis Date  . Cancer   . Hypertension   . Diabetes mellitus   . GERD (gastroesophageal reflux disease)   . Thyroid disease   . Anxiety    Past Surgical History  Procedure Laterality Date  . Mastectomy    . Cholecystectomy     Social History:   reports that she has never smoked. She has never used smokeless tobacco. She reports that she does not drink alcohol or use illicit drugs.  No family history on  file.  Medications: Patient's Medications  New Prescriptions   No medications on file  Previous Medications   ALBUTEROL (PROVENTIL HFA;VENTOLIN HFA) 108 (90 BASE) MCG/ACT INHALER    Inhale 2 puffs into the lungs every 4 (four) hours as needed for wheezing or shortness of breath.   BENAZEPRIL (LOTENSIN) 20 MG TABLET    Take 20 mg by mouth 2 (two) times daily.   CALCITONIN, SALMON, (MIACALCIN/FORTICAL) 200 UNIT/ACT NASAL SPRAY    Place 1 spray into the nose daily. Alternating sids of  Nose daily   CLONAZEPAM (KLONOPIN) 0.5 MG TABLET    Take 1 tablet (0.5 mg total) by mouth 3 (three) times daily as needed for anxiety.   CYANOCOBALAMIN (,VITAMIN B-12,) 1000 MCG/ML INJECTION    Inject 1,000 mcg into the skin every 30 (thirty) days.   DEXTROMETHORPHAN-GUAIFENESIN (MUCINEX DM) 30-600 MG PER 12 HR TABLET    Take 1 tablet by mouth 2 (two) times daily.   DULOXETINE (CYMBALTA) 30 MG CAPSULE    Take 1 capsule by mouth daily.   FLUTICASONE (FLONASE) 50 MCG/ACT NASAL SPRAY    Place 2 sprays into the nose every morning.    LEVOTHYROXINE (SYNTHROID, LEVOTHROID) 125 MCG TABLET    Take 125 mcg by mouth daily before breakfast.   LORATADINE (CLARITIN) 10 MG TABLET    Take 10 mg by mouth every morning.   PAROXETINE (PAXIL) 40 MG TABLET    Take 0.5 tablets (20 mg total) by mouth daily.   PITAVASTATIN CALCIUM (LIVALO) 2 MG TABS  Take 1 mg by mouth at bedtime.    QUETIAPINE (SEROQUEL) 50 MG TABLET    Take 0.5-1 tablets (25-50 mg total) by mouth 2 (two) times daily. 1/2 tablet in the morning and 1 tablet in the evening.   VITAMIN D, ERGOCALCIFEROL, (DRISDOL) 50000 UNITS CAPS    Take 50,000 Units by mouth every 7 (seven) days. Taken on Thursdays  Modified Medications   No medications on file  Discontinued Medications   No medications on file     Physical Exam: Filed Vitals:   05/25/13 1452  BP: 135/78  Pulse: 88  Temp: 99.2 F (37.3 C)  Resp: 18  SpO2: 95%   General- elderly female in no acute  distress Head- atraumatic, normocephalic Eyes- PERRLA, EOMI, no pallor Neck- no lymphadenopathy, no thyromegaly, no jugular vein distension, no carotid bruit Cardiovascular- normal s1,s2, no murmurs/ rubs/ gallops Respiratory- bilateral clear to auscultation, no wheeze, no rhonchi, no crackles Abdomen- bowel sounds present, soft, non tender Musculoskeletal- able to move all 4 extremities, no spinal and paraspinal tenderness Neurological- no focal deficit Psychiatry- alert and oriented to person and anxious at present    Labs reviewed: Basic Metabolic Panel:  Recent Labs  03/24/13 0510 05/03/13 2155 05/17/13 2207  NA 134* 134* 134*  K 4.2 4.5 4.2  CL 94* 91* 94*  CO2 28 32 28  GLUCOSE 135* 187* 183*  BUN 7 9 6   CREATININE 0.74 0.78 0.77  CALCIUM 9.2 9.3 9.4   Liver Function Tests:  Recent Labs  03/24/13 0510 05/03/13 2155 05/17/13 2207  AST 23 160* 26  ALT 18 86* 20  ALKPHOS 51 60 66  BILITOT 0.4 0.3 0.5  PROT 6.9 7.3 7.5  ALBUMIN 3.7 3.5 3.6   No results found for this basename: LIPASE, AMYLASE,  in the last 8760 hours No results found for this basename: AMMONIA,  in the last 8760 hours CBC:  Recent Labs  03/24/13 0510 05/03/13 2155 05/17/13 2207  WBC 5.1 3.6* 8.1  NEUTROABS 3.0 2.1 6.3  HGB 14.7 14.9 13.9  HCT 42.2 43.3 40.8  MCV 90.0 91.0 89.7  PLT 218 161 252   Cardiac Enzymes:  Recent Labs  03/24/13 0510 05/20/13 0506  TROPONINI <0.30 <0.30   BNP: No components found with this basename: POCBNP,  CBG:  Recent Labs  12/15/12 2202 12/16/12 0803 12/16/12 1300  GLUCAP 178* 166* 177*    Radiological Exams: Ct Angio Chest Pe W/cm &/or Wo Cm  05/20/2013   CLINICAL DATA:  Chest pain and upper mid back pain and weakness. Concern for aortic dissection.  EXAM: CT ANGIOGRAPHY CHEST WITH CONTRAST  TECHNIQUE: Multidetector CT imaging of the chest was performed using the standard protocol during bolus administration of intravenous contrast.  Multiplanar CT image reconstructions and MIPs were obtained to evaluate the vascular anatomy.  CONTRAST:  162mL OMNIPAQUE IOHEXOL 350 MG/ML SOLN  COMPARISON:  DG CHEST 1V PORT dated 05/17/2013; MR T SPINE WO/W CM dated 10/12/2009; CT T SPINE W/O CM dated 11/05/2008  FINDINGS: Noncontrast images are without evidence of thoracic aortic intramural hematoma. The thoracic aorta is normal in caliber. There is no evidence of dissection. There is no significant atherosclerotic disease of the thoracic aorta. There is a 3 vessel aortic arch. The proximal common carotid arteries follow a medial course posterior to the larynx and hypopharynx. The main pulmonary artery is enlarged, measuring 3.5 cm in diameter. Mild LAD coronary artery calcification is noted.  There is trace right pleural fluid versus posterior  pleural thickening. There is no pericardial effusion. Multiple surgical clips are present in the right axilla the patient is status post right mastectomy. No enlarged axillary, mediastinal, or hilar lymph nodes are identified.  Evaluation of the lung parenchyma is mildly limited by respiratory motion. Minimal biapical scarring is present. Additional linear opacities in the right lung at the level of hilum may also represent scarring. There is evidence of mild volume loss in the right lung, and scattered, patchy ground-glass opacities are present in the right lung.  9 mm hypodensity in the posterior right hepatic lobe likely represents a cyst. 2.6 cm lesion in the lateral left hepatic lobe demonstrates discontinuous peripheral nodular enhancement and is compatible with a hemangioma. A 1 cm cyst is noted in the posterior left kidney.  Sclerotic focus in the T3 vertebral body likely represents a bone island. Mild to moderate compression deformities of T7 and T8 are unchanged from prior CT severe T12 compression fracture is unchanged from prior MRI, as is moderate L2 compression fracture.  Review of the MIP images confirms the  above findings.  IMPRESSION: 1. Unremarkable appearance of the thoracic aorta. No evidence of dissection. 2. Enlargement of the main pulmonary artery, which is nonspecific but can be seen in the setting of pulmonary arterial hypertension. 3. Trace right pleural fluid versus pleural thickening. 4. Mild scarring, volume loss, and patchy ground-glass opacities in the right lung. Query prior radiation therapy. 5. Unchanged thoracic compression fractures.   Electronically Signed   By: Logan Bores   On: 05/20/2013 06:33      Assessment/Plan  Nausea- possible from delayed gastric emptying in setting of her hx of DM and recent big meal. Staff advised to provide small portion meal. Will provide a dose of maalox for now and phenergan prn for nausea. Reassess if no improvement  Hypertension- bp stable. Continue benazepril 20 mg bid  Anxiety and depression- persists, will change clonazepam to 0.5 mg q8h standing, hold for sedation. Continue duloxetine 30 mg daily and paroxetine  b12 deficiency- continue b12 supplement  Dementia- persists. Provide assistance with ADLs. Continue seroquel 25 mg in am and 50 mg in pm. Fall precautions. Skin care with pressure ulcer prophylaxis.   Dm- off all medications. Continue ACEI and statin   Family/ staff Communication: reviewed care plan with patient and nursing supervisor  Goals of care: hospice care  Labs/tests ordered- none  Blanchie Serve, MD  [facility] Adult Medicine [phone] (Monday-Friday 8 am - 5 pm) [phone] (afterhours)

## 2013-06-16 ENCOUNTER — Non-Acute Institutional Stay (SKILLED_NURSING_FACILITY): Payer: Medicare Other | Admitting: Internal Medicine

## 2013-06-16 DIAGNOSIS — K5900 Constipation, unspecified: Secondary | ICD-10-CM

## 2013-06-16 DIAGNOSIS — F411 Generalized anxiety disorder: Secondary | ICD-10-CM

## 2013-06-16 DIAGNOSIS — I10 Essential (primary) hypertension: Secondary | ICD-10-CM

## 2013-06-16 DIAGNOSIS — E785 Hyperlipidemia, unspecified: Secondary | ICD-10-CM

## 2013-06-16 DIAGNOSIS — H5789 Other specified disorders of eye and adnexa: Secondary | ICD-10-CM

## 2013-06-16 DIAGNOSIS — F03918 Unspecified dementia, unspecified severity, with other behavioral disturbance: Secondary | ICD-10-CM

## 2013-06-16 DIAGNOSIS — F0391 Unspecified dementia with behavioral disturbance: Secondary | ICD-10-CM

## 2013-06-22 NOTE — Progress Notes (Signed)
Patient ID: Alexis Reese, female   DOB: 03/06/1935, 79 y.o.   MRN: [id number]    [name] living [name]  Chief Complaint  Patient presents with  . Medical Managment of Chronic Issues    anxiety, constipation, red painful eyes    No Known Allergies  HPI:   79 y/o female patient is here for long term care and under hospice care services as well. She is seen today for routine visit. She mentions being anxious and has concerns about her bowel movement. She was noted to have pain with redness in both her eyes this am. It has completely resolved as i see her  Review of Systems:  Constitutional: Negative for fever, chills, diaphoresis.  HENT: Negative for congestion, hearing loss and sore throat.   Eyes: Negative for blurred vision, double vision and discharge.  Respiratory: Negative for cough, sputum production, shortness of breath and wheezing.   Cardiovascular: Negative for chest pain, palpitations and leg swelling.  Gastrointestinal: Negative for vomiting, diarrhea. Has constipation.  Genitourinary: has urinary incontinence Musculoskeletal: Negative for falls, joint pain and myalgias.  Skin: Negative for itching and rash.  Neurological: Positive for weakness. Negative for dizziness, tingling, focal weakness and headaches.  Psychiatric/Behavioral: has memory loss. The patient is anxious  Physical exam BP 140/78  Pulse 88  Temp(Src) 97.1 F (36.2 C)  Resp 18  General- elderly female in no acute distress Head- atraumatic, normocephalic Eyes- PERRLA, EOMI, no pallor Neck- no lymphadenopathy, no thyromegaly, no jugular vein distension, no carotid bruit Cardiovascular- normal s1,s2, no murmurs/ rubs/ gallops Respiratory- bilateral clear to auscultation, no wheeze, no rhonchi, no crackles Abdomen- bowel sounds present, soft, non tender Musculoskeletal- able to move all 4 extremities, no spinal and paraspinal tenderness Neurological- no focal deficit Psychiatry- alert and  oriented to person and anxious at present    Assessment/Plan  Anxiety Has worsened. Will change her clonazepam to 0.5 mg q12h and also have her on 0.25 mg q8h prn anxiety  Red eye Resolved. No pain or discharge from her eye at present. Monitor clinically  Constipation D/c milk of magnesia. D/c colace. Will have her on senna s 2 tab daily with miralax 17 g daily. Will also provide dulcolax suppository x1 now.   Hyperlipidemia Continue atorvastatin for now  Hypertension bp stable. Continue benazepril 20 mg bid  Dementia with behavioral disturbance Adjustment made to anxiolytic as above. Continue paroxetine 20 mg daily. Also on cymbalta 30 mg daily. She is on seroquel 25 in am and 50 in pm as well. Fall precautions. Skin care with pressure ulcer prophylaxis.   Goals of care: hospice care  Labs/tests ordered- none  Blanchie Serve, MD  [facility] Adult Medicine [phone] (Monday-Friday 8 am - 5 pm) [phone] (afterhours)

## 2013-06-30 ENCOUNTER — Other Ambulatory Visit: Payer: Self-pay | Admitting: *Deleted

## 2013-06-30 ENCOUNTER — Non-Acute Institutional Stay (SKILLED_NURSING_FACILITY): Payer: Medicare Other | Admitting: Internal Medicine

## 2013-06-30 DIAGNOSIS — N390 Urinary tract infection, site not specified: Secondary | ICD-10-CM

## 2013-06-30 DIAGNOSIS — H04129 Dry eye syndrome of unspecified lacrimal gland: Secondary | ICD-10-CM

## 2013-06-30 DIAGNOSIS — H04123 Dry eye syndrome of bilateral lacrimal glands: Secondary | ICD-10-CM

## 2013-06-30 MED ORDER — CLONAZEPAM 0.5 MG PO TABS: ORAL_TABLET | ORAL | Status: DC

## 2013-06-30 NOTE — Progress Notes (Signed)
Patient ID: Alexis Reese, female   DOB: [birth year], 79 y.o.   MRN: [id number]    [name] living [name]  Chief Complaint  Patient presents with  . Acute Visit    right eye drainage, dysuria   No Known Allergies  HPI:   79 y/o female patient is seen for acute visit. She has been having red eyes in the morning for few days and complaining of discomfort in am. No redness at present. She has been having discomfort with burning with urination for few days. Her urine was sent for study and culture result is back. No fever or chills reported. She has hx of anxiety.   Review of Systems:  Constitutional: Negative for fever, chills, diaphoresis.  HENT: Negative for congestion, hearing loss and sore throat.   Eyes: Negative for blurred vision, double vision and discharge.  Respiratory: Negative for cough, sputum production, shortness of breath and wheezing.   Cardiovascular: Negative for chest pain, palpitations and leg swelling.  Gastrointestinal: Negative for vomiting, diarrhea and constipation.  Genitourinary: has urinary incontinence  Past Medical History  Diagnosis Date  . Cancer   . Hypertension   . Diabetes mellitus   . GERD (gastroesophageal reflux disease)   . Thyroid disease   . Anxiety    Current Outpatient Prescriptions on File Prior to Visit  Medication Sig Dispense Refill  . albuterol (PROVENTIL HFA;VENTOLIN HFA) 108 (90 BASE) MCG/ACT inhaler Inhale 2 puffs into the lungs every 4 (four) hours as needed for wheezing or shortness of breath.      Marland Kitchen atorvastatin (LIPITOR) 10 MG tablet Take 10 mg by mouth daily.      . benazepril (LOTENSIN) 20 MG tablet Take 20 mg by mouth 2 (two) times daily.      . calcitonin, salmon, (MIACALCIN/FORTICAL) 200 UNIT/ACT nasal spray Place 1 spray into the nose daily. Alternating sids of  Nose daily      . cyanocobalamin (,VITAMIN B-12,) 1000 MCG/ML injection Inject 1,000 mcg into the skin every 30 (thirty) days.      Marland Kitchen  dextromethorphan-guaiFENesin (MUCINEX DM) 30-600 MG per 12 hr tablet Take 1 tablet by mouth 2 (two) times daily.  20 tablet  0  . DULoxetine (CYMBALTA) 30 MG capsule Take 1 capsule by mouth daily.      . fluticasone (FLONASE) 50 MCG/ACT nasal spray Place 2 sprays into the nose every morning.       Marland Kitchen levothyroxine (SYNTHROID, LEVOTHROID) 125 MCG tablet Take 125 mcg by mouth daily before breakfast.      . loratadine (CLARITIN) 10 MG tablet Take 10 mg by mouth every morning.      Marland Kitchen PARoxetine (PAXIL) 40 MG tablet Take 0.5 tablets (20 mg total) by mouth daily.  30 tablet  0  . QUEtiapine (SEROQUEL) 50 MG tablet Take 0.5-1 tablets (25-50 mg total) by mouth 2 (two) times daily. 1/2 tablet in the morning and 1 tablet in the evening.  30 tablet  0  . Vitamin D, Ergocalciferol, (DRISDOL) 50000 UNITS CAPS Take 50,000 Units by mouth every 7 (seven) days. Taken on Thursdays       No current facility-administered medications on file prior to visit.   Physical exam BP 124/83  Pulse 99  Temp(Src) 97.6 F (36.4 C)  Resp 18  General- elderly female in no acute distress Head- atraumatic, normocephalic Eyes- PERRLA, EOMI, no pallor, clear sclera and conjunctiva, no discharge Neck- no lymphadenopathy, no thyromegaly, no jugular vein distension, no carotid bruit Cardiovascular- normal s1,s2,  no murmurs/ rubs/ gallops Respiratory- bilateral clear to auscultation, no wheeze, no rhonchi, no crackles Abdomen- bowel sounds present, soft, non tender, no suprapubic tenderness Musculoskeletal- able to move all 4 extremities, no spinal and paraspinal tenderness Neurological- no focal deficit Psychiatry- alert and oriented to person and anxious at present    Lab 06/29/13 u/a- elevated glucose, small leukocyte esterase, 11-20 wbc, many bacteria 06/29/13 urine culture- e.coli, > 100,000 colonies, sensitive to augmentin  Assessment/plan  uti- has e.coli uti, will start augmentin 875 mg bid for 1 week with florastor  250 mg bid for 2 weeks. Encouraged hydration  Red eye- likely from dry eyes. Will have her on systane eye drop 1 gtt each eye bid for a week and reassess if still persists

## 2013-06-30 NOTE — Telephone Encounter (Signed)
[facility] [place] 

## 2013-06-30 NOTE — Telephone Encounter (Signed)
Alexis Reese

## 2013-07-10 ENCOUNTER — Non-Acute Institutional Stay (SKILLED_NURSING_FACILITY): Payer: Medicare Other | Admitting: Internal Medicine

## 2013-07-10 DIAGNOSIS — F3289 Other specified depressive episodes: Secondary | ICD-10-CM

## 2013-07-10 DIAGNOSIS — F0393 Unspecified dementia, unspecified severity, with mood disturbance: Secondary | ICD-10-CM

## 2013-07-10 DIAGNOSIS — F0280 Dementia in other diseases classified elsewhere without behavioral disturbance: Secondary | ICD-10-CM

## 2013-07-10 DIAGNOSIS — F411 Generalized anxiety disorder: Secondary | ICD-10-CM

## 2013-07-10 DIAGNOSIS — F03918 Unspecified dementia, unspecified severity, with other behavioral disturbance: Secondary | ICD-10-CM

## 2013-07-10 DIAGNOSIS — F329 Major depressive disorder, single episode, unspecified: Secondary | ICD-10-CM

## 2013-07-10 DIAGNOSIS — F0391 Unspecified dementia with behavioral disturbance: Secondary | ICD-10-CM

## 2013-07-10 NOTE — Progress Notes (Signed)
Patient ID: Alexis Reese, female   DOB: 1934/05/29, 79 y.o.   MRN: [id number]    Alexis Reese living [name] Chief Complaint  Patient presents with  . Acute Visit    labile mood, tearful, anxiety   No Known Allergies  HPI:   79 y/o female patient is here for long term care. She is seen today for acute visit. She mentions being anxious. Staff is concerned about her chronic anxiety and labile mood.  Her redness of the eyes have resolved. She has been receiving duloxetine and paroxetine at present  Review of Systems:  Constitutional: Negative for fever, chills, diaphoresis.      Eyes: Negative for blurred vision, double vision and discharge.   Respiratory: Negative for cough, sputum production, shortness of breath and wheezing.    Cardiovascular: Negative for chest pain, palpitations and leg swelling.   Gastrointestinal: Negative for vomiting, diarrhea. Has constipation.   Genitourinary: has urinary incontinence Musculoskeletal: Negative for falls, joint pain and myalgias.   Skin: Negative for itching and rash.   Neurological: Positive for weakness. Negative for dizziness, tingling, focal weakness and headaches.   Psychiatric/Behavioral: has memory loss. The patient is anxious  Past Medical History  Diagnosis Date  . Cancer   . Hypertension   . Diabetes mellitus   . GERD (gastroesophageal reflux disease)   . Thyroid disease   . Anxiety    Past Surgical History  Procedure Laterality Date  . Mastectomy    . Cholecystectomy     Medication reviewed. See [facility]  Physical exam BP 140/78  Pulse 62  Temp(Src) 97.5 F (36.4 C)  Resp 18  Wt 165 lb (74.844 kg)  SpO2 95%  General- elderly female in no acute distress Head- atraumatic, normocephalic Eyes- PERRLA, EOMI, no pallor Neck- no lymphadenopathy, no thyromegaly, no jugular vein distension, no carotid bruit Cardiovascular- normal s1,s2, no murmurs/ rubs/ gallops Respiratory- bilateral clear to auscultation, no wheeze, no  rhonchi, no crackles Abdomen- bowel sounds present, soft, non tender Musculoskeletal- able to move all 4 extremities, no spinal and paraspinal tenderness Neurological- no focal deficit Psychiatry- alert and oriented to person and anxious at present    Assessment/Plan  Generalized anxiety disorder- will increase her duloxetine to 60 mg daily. This should help with her depression as well. Will decrease paroxetine to 10 mg daily for 1 week and stop. Will change her clonazepam to 0.5 mg q8h for now and discontinue the prn dosing  Depression- present and worsening. Will have her duloxetine increased to 60 mg daily and reassess. See above  Dementia with behavioral disturbance She is on seroquel 25 in am and 50 in pm, monitor clinically

## 2013-08-08 ENCOUNTER — Non-Acute Institutional Stay (SKILLED_NURSING_FACILITY): Payer: Medicare Other | Admitting: Internal Medicine

## 2013-08-08 DIAGNOSIS — M47817 Spondylosis without myelopathy or radiculopathy, lumbosacral region: Secondary | ICD-10-CM

## 2013-08-08 DIAGNOSIS — M47816 Spondylosis without myelopathy or radiculopathy, lumbar region: Secondary | ICD-10-CM

## 2013-08-08 DIAGNOSIS — E538 Deficiency of other specified B group vitamins: Secondary | ICD-10-CM

## 2013-08-08 DIAGNOSIS — I10 Essential (primary) hypertension: Secondary | ICD-10-CM

## 2013-08-08 DIAGNOSIS — F3289 Other specified depressive episodes: Secondary | ICD-10-CM

## 2013-08-08 DIAGNOSIS — F0393 Unspecified dementia, unspecified severity, with mood disturbance: Secondary | ICD-10-CM

## 2013-08-08 DIAGNOSIS — E119 Type 2 diabetes mellitus without complications: Secondary | ICD-10-CM

## 2013-08-08 DIAGNOSIS — J3089 Other allergic rhinitis: Secondary | ICD-10-CM

## 2013-08-08 DIAGNOSIS — F0280 Dementia in other diseases classified elsewhere without behavioral disturbance: Secondary | ICD-10-CM

## 2013-08-08 DIAGNOSIS — F32A Depression, unspecified: Secondary | ICD-10-CM

## 2013-08-08 DIAGNOSIS — F329 Major depressive disorder, single episode, unspecified: Secondary | ICD-10-CM

## 2013-08-08 DIAGNOSIS — J302 Other seasonal allergic rhinitis: Secondary | ICD-10-CM

## 2013-08-08 NOTE — Progress Notes (Signed)
Patient ID: Alexis Reese, female   DOB: Sep 25, 1934, 79 y.o.   MRN: [id number]  Location: [facility] Provider:  Jonelle Sidle L. Mariea Clonts, D.O., C.M.D.  Code Status:  Full code  Chief Complaint  Patient presents with  . Acute Visit    needs a change in allergy meds  . Medical Management of Chronic Issues    HPI:  79 yo white female long term care resident seen due to increase rhinorrhea, congestion, watery eyes and to manage her chronic conditions.    Review of Systems:  Review of Systems  Constitutional: Negative for fever.  HENT: Positive for congestion and hearing loss. Negative for ear pain and sore throat.   Eyes: Negative for blurred vision.  Respiratory: Positive for cough. Negative for shortness of breath.   Cardiovascular: Negative for chest pain and leg swelling.  Gastrointestinal: Negative for abdominal pain.  Genitourinary: Negative for dysuria.  Musculoskeletal: Negative for falls.  Skin: Negative for rash.  Neurological: Positive for headaches. Negative for dizziness.  Endo/Heme/Allergies: Positive for environmental allergies.  Psychiatric/Behavioral: Positive for depression and memory loss.    Medications: Patient's Medications  New Prescriptions   No medications on file  Previous Medications   ALBUTEROL (PROVENTIL HFA;VENTOLIN HFA) 108 (90 BASE) MCG/ACT INHALER    Inhale 2 puffs into the lungs every 4 (four) hours as needed for wheezing or shortness of breath.   ATORVASTATIN (LIPITOR) 10 MG TABLET    Take 10 mg by mouth daily.   BENAZEPRIL (LOTENSIN) 20 MG TABLET    Take 20 mg by mouth 2 (two) times daily.   CALCITONIN, SALMON, (MIACALCIN/FORTICAL) 200 UNIT/ACT NASAL SPRAY    Place 1 spray into the nose daily. Alternating sids of  Nose daily   CLONAZEPAM (KLONOPIN) 0.5 MG TABLET    Take 1/2 tablet by mouth every 8 hours as needed for anxiety; Take one tablet by mouth every 12 hours for anxiety   CYANOCOBALAMIN (,VITAMIN B-12,) 1000 MCG/ML INJECTION     Inject 1,000 mcg into the skin every 30 (thirty) days.   DEXTROMETHORPHAN-GUAIFENESIN (MUCINEX DM) 30-600 MG PER 12 HR TABLET    Take 1 tablet by mouth 2 (two) times daily.   DULOXETINE (CYMBALTA) 30 MG CAPSULE    Take 1 capsule by mouth daily.   FLUTICASONE (FLONASE) 50 MCG/ACT NASAL SPRAY    Place 2 sprays into the nose every morning.    LEVOTHYROXINE (SYNTHROID, LEVOTHROID) 125 MCG TABLET    Take 125 mcg by mouth daily before breakfast.   LORATADINE (CLARITIN) 10 MG TABLET    Take 10 mg by mouth every morning.   PAROXETINE (PAXIL) 40 MG TABLET    Take 0.5 tablets (20 mg total) by mouth daily.   POLYETHYLENE GLYCOL (MIRALAX / GLYCOLAX) PACKET    Take 17 g by mouth daily as needed.   QUETIAPINE (SEROQUEL) 50 MG TABLET    Take 0.5-1 tablets (25-50 mg total) by mouth 2 (two) times daily. 1/2 tablet in the morning and 1 tablet in the evening.   SENNOSIDES-DOCUSATE SODIUM (SENOKOT-S) 8.6-50 MG TABLET    Take 2 tablets by mouth daily.   VITAMIN D, ERGOCALCIFEROL, (DRISDOL) 50000 UNITS CAPS    Take 50,000 Units by mouth every 7 (seven) days. Taken on Thursdays  Modified Medications   No medications on file  Discontinued Medications   No medications on file    Physical Exam: There were no vitals filed for this visit. Physical Exam  Constitutional: She appears well-developed and well-nourished.  No distress.  Pleasant female seated in her wheelchair  HENT:  rhinorrhea  Neck: Neck supple.  Cardiovascular: Normal rate, regular rhythm, normal heart sounds and intact distal pulses.   Pulmonary/Chest: Effort normal and breath sounds normal.  Abdominal: Soft. Bowel sounds are normal. She exhibits no distension and no mass. There is no tenderness.  Lymphadenopathy:    She has no cervical adenopathy.  Skin: Skin is warm and dry.   Labs reviewed: Basic Metabolic Panel:  Recent Labs  03/24/13 0510 05/03/13 2155 05/17/13 2207  NA 134* 134* 134*  K 4.2 4.5 4.2  CL 94* 91* 94*  CO2 28 32 28    GLUCOSE 135* 187* 183*  BUN 7 9 6   CREATININE 0.74 0.78 0.77  CALCIUM 9.2 9.3 9.4    Liver Function Tests:  Recent Labs  03/24/13 0510 05/03/13 2155 05/17/13 2207  AST 23 160* 26  ALT 18 86* 20  ALKPHOS 51 60 66  BILITOT 0.4 0.3 0.5  PROT 6.9 7.3 7.5  ALBUMIN 3.7 3.5 3.6    CBC:  Recent Labs  03/24/13 0510 05/03/13 2155 05/17/13 2207  WBC 5.1 3.6* 8.1  NEUTROABS 3.0 2.1 6.3  HGB 14.7 14.9 13.9  HCT 42.2 43.3 40.8  MCV 90.0 91.0 89.7  PLT 218 161 252    Assessment/Plan 1. Other seasonal allergic rhinitis change claritin to zyrtec  2. Essential hypertension, benign -cont lotensin  3. Type 2 diabetes mellitus without complication -cont lotensin, lipitor, is not on asa?, diet controlled  4. Depression due to dementia -is being transitioned from paxil to cymbalta by [facility], it appears, also on seroquel -also on klonopin for anxiety--would try to avoid overuse due to fall risk and confusion  5. Spondylosis of lumbar region without myelopathy or radiculopathy -cont miacalcin for compression fx, osteoporosis -not on other pain medication aside from prn tylenol for her back  6. B12 deficiency -cont monthly injections  Family/ staff Communication: seen with unit supervisor  Goals of care: full code

## 2013-09-14 ENCOUNTER — Non-Acute Institutional Stay (SKILLED_NURSING_FACILITY): Payer: Medicare Other | Admitting: Internal Medicine

## 2013-09-14 DIAGNOSIS — F03918 Unspecified dementia, unspecified severity, with other behavioral disturbance: Secondary | ICD-10-CM

## 2013-09-14 DIAGNOSIS — E119 Type 2 diabetes mellitus without complications: Secondary | ICD-10-CM

## 2013-09-14 DIAGNOSIS — E559 Vitamin D deficiency, unspecified: Secondary | ICD-10-CM

## 2013-09-14 DIAGNOSIS — E538 Deficiency of other specified B group vitamins: Secondary | ICD-10-CM

## 2013-09-14 DIAGNOSIS — I10 Essential (primary) hypertension: Secondary | ICD-10-CM

## 2013-09-14 DIAGNOSIS — E785 Hyperlipidemia, unspecified: Secondary | ICD-10-CM

## 2013-09-14 DIAGNOSIS — F482 Pseudobulbar affect: Secondary | ICD-10-CM

## 2013-09-14 DIAGNOSIS — F29 Unspecified psychosis not due to a substance or known physiological condition: Secondary | ICD-10-CM

## 2013-09-14 DIAGNOSIS — F0391 Unspecified dementia with behavioral disturbance: Secondary | ICD-10-CM

## 2013-09-14 DIAGNOSIS — E039 Hypothyroidism, unspecified: Secondary | ICD-10-CM

## 2013-09-14 DIAGNOSIS — R81 Glycosuria: Secondary | ICD-10-CM

## 2013-09-14 NOTE — Progress Notes (Signed)
Patient ID: Alexis Reese, female   DOB: 01-17-1935, 79 y.o.   MRN: [id number]    Facility: [facility]  Chief Complaint  Patient presents with  . Medical Management of Chronic Issues    urinary complaints, behavior changes   No Known Allergies  Code: full code  HPI:   79 y/o female patient is here for long term care and seen today for routine visit. She recently completed a course of antibiotic for her UTI. Her behavior has been calmer. A repeat urine culture is pending to confirm clearance of uti.  With her dementia getting ROS is difficult  Review of Systems:  Unable to obtain  Past Medical History  Diagnosis Date  . Cancer   . Hypertension   . Diabetes mellitus   . GERD (gastroesophageal reflux disease)   . Thyroid disease   . Anxiety    Medication reviewed. See [facility] Outpatient Encounter Prescriptions as of 09/14/2013  Medication Sig  . albuterol (PROVENTIL HFA;VENTOLIN HFA) 108 (90 BASE) MCG/ACT inhaler Inhale 2 puffs into the lungs every 4 (four) hours as needed for wheezing or shortness of breath.  Marland Kitchen atorvastatin (LIPITOR) 10 MG tablet Take 10 mg by mouth daily.  . benazepril (LOTENSIN) 20 MG tablet Take 20 mg by mouth 2 (two) times daily.  . calcitonin, salmon, (MIACALCIN/FORTICAL) 200 UNIT/ACT nasal spray Place 1 spray into the nose daily. Alternating sids of  Nose daily  . cetirizine (ZYRTEC) 10 MG tablet Take 10 mg by mouth daily.  . clonazePAM (KLONOPIN) 0.5 MG tablet Take 0.5 mg by mouth 3 (three) times daily. Take 1/2 tablet by mouth every 8 hours as needed for anxiety; Take one tablet by mouth every 12 hours for anxiety  . cyanocobalamin (,VITAMIN B-12,) 1000 MCG/ML injection Inject 1,000 mcg into the skin every 30 (thirty) days.  . Dextromethorphan-Quinidine (NUEDEXTA) 20-10 MG CAPS Take 1 tablet by mouth 2 (two) times daily.  . divalproex (DEPAKOTE SPRINKLE) 125 MG capsule Take 125 mg by mouth 2 (two) times daily.  . DULoxetine (CYMBALTA) 20  MG capsule Take 20 mg by mouth daily.  . fluticasone (FLONASE) 50 MCG/ACT nasal spray Place 2 sprays into the nose every morning.   Marland Kitchen levothyroxine (SYNTHROID, LEVOTHROID) 125 MCG tablet Take 125 mcg by mouth daily before breakfast.  . Melatonin 3 MG CAPS Take 1 capsule by mouth daily.  . polyethylene glycol (MIRALAX / GLYCOLAX) packet Take 17 g by mouth daily as needed.  Marland Kitchen QUEtiapine (SEROQUEL) 50 MG tablet Take 25-150 mg by mouth 3 (three) times daily. 1 tablet in morning, half tablet in afternoon and 3 tablet at night  . sennosides-docusate sodium (SENOKOT-S) 8.6-50 MG tablet Take 2 tablets by mouth daily.  . Vitamin D, Ergocalciferol, (DRISDOL) 50000 UNITS CAPS Take 50,000 Units by mouth every 7 (seven) days. Taken on Thursdays  . [DISCONTINUED] clonazePAM (KLONOPIN) 0.5 MG tablet Take 1/2 tablet by mouth every 8 hours as needed for anxiety; Take one tablet by mouth every 12 hours for anxiety  . [DISCONTINUED] dextromethorphan-guaiFENesin (MUCINEX DM) 30-600 MG per 12 hr tablet Take 1 tablet by mouth 2 (two) times daily.  . [DISCONTINUED] QUEtiapine (SEROQUEL) 50 MG tablet Take 0.5-1 tablets (25-50 mg total) by mouth 2 (two) times daily. 1/2 tablet in the morning and 1 tablet in the evening.  . [DISCONTINUED] DULoxetine (CYMBALTA) 30 MG capsule Take 1 capsule by mouth daily.  . [DISCONTINUED] loratadine (CLARITIN) 10 MG tablet Take 10 mg by mouth every morning.  . [  DISCONTINUED] PARoxetine (PAXIL) 40 MG tablet Take 0.5 tablets (20 mg total) by mouth daily.   Physical exam BP 127/78  Pulse 76  Temp(Src) 96.8 F (36 C)  Resp 18  Wt 160 lb (72.576 kg)  SpO2 97%  General- elderly female in no acute distress Head- atraumatic, normocephalic Eyes- PERRLA, EOMI, no pallor Neck- no lymphadenopathy, no thyromegaly, no jugular vein distension, no carotid bruit Cardiovascular- normal s1,s2, no murmurs/ rubs/ gallops Respiratory- bilateral clear to auscultation, no wheeze, no rhonchi, no  crackles Abdomen- bowel sounds present, soft, non tender Musculoskeletal- able to move all 4 extremities, no spinal and paraspinal tenderness Neurological- no focal deficit Psychiatry- alert and oriented to person only, calm  Labs- Lab Results  Component Value Date   HGBA1C 7.6* 05/24/2012   Lab Results  Component Value Date   WBC 8.1 05/17/2013   HGB 13.9 05/17/2013   HCT 40.8 05/17/2013   MCV 89.7 05/17/2013   PLT 252 05/17/2013   Lab Results  Component Value Date   CREATININE 0.77 05/17/2013   Lab Results  Component Value Date   TSH 0.952 12/14/2012    Assessment/Plan  Glucosuria With her glucosuria noted on u/a and recurrent uti, will recheck for diabetes. Check cmp and a1c next lab draw  Dm On review of prior a1c, has hx of dm as per a1c 7.6 in 2/14. No a1c after that. Recheck a1c. On ACEI, statin. Will add baby aspirin from today. Review a1c and if > 7 consider hypoglycemic agent  Psychosis Stable at present. Continue seroquel 150 mg daily, 50 mg in am and 25 mg in afternoon. Followed by psych services  psudobulbar palsy Continue nudexta for now  Anxiety Continue clonazepam to 0.5 mg tid  Hyperlipidemia Continue atorvastatin for now and check lipid panel  Hypertension bp stable. Continue benazepril 20 mg bid  Hypothyroidism Continue levothyroxine and check tsh. Reduce dose if tsh < 2  Dementia with behavioral disturbance Continue depakote sprinkles 125 mg bid with duloxetine 20 mg daily, seroquel 150 mg daily and nudexta  b12 def Continue b12 supplement  Vitamin d def Continue vit d supplement  Blanchie Serve, MD  [facility] [phone] (Monday-Friday 8 am - 5 pm) [phone] (afterhours)

## 2013-09-15 DIAGNOSIS — R81 Glycosuria: Secondary | ICD-10-CM | POA: Insufficient documentation

## 2013-09-15 DIAGNOSIS — E039 Hypothyroidism, unspecified: Secondary | ICD-10-CM | POA: Insufficient documentation

## 2013-09-15 DIAGNOSIS — F29 Unspecified psychosis not due to a substance or known physiological condition: Secondary | ICD-10-CM | POA: Insufficient documentation

## 2013-09-15 DIAGNOSIS — E785 Hyperlipidemia, unspecified: Secondary | ICD-10-CM | POA: Insufficient documentation

## 2013-09-15 DIAGNOSIS — F482 Pseudobulbar affect: Secondary | ICD-10-CM | POA: Insufficient documentation

## 2013-09-15 DIAGNOSIS — E559 Vitamin D deficiency, unspecified: Secondary | ICD-10-CM | POA: Insufficient documentation

## 2013-10-05 ENCOUNTER — Non-Acute Institutional Stay (SKILLED_NURSING_FACILITY): Payer: Medicare Other | Admitting: Internal Medicine

## 2013-10-05 DIAGNOSIS — E1165 Type 2 diabetes mellitus with hyperglycemia: Secondary | ICD-10-CM

## 2013-10-05 DIAGNOSIS — E119 Type 2 diabetes mellitus without complications: Secondary | ICD-10-CM

## 2013-10-05 DIAGNOSIS — E039 Hypothyroidism, unspecified: Secondary | ICD-10-CM

## 2013-10-05 NOTE — Progress Notes (Signed)
Patient ID: Alexis Reese, female   DOB: 25-Jul-1934, 79 y.o.   MRN: [id number]    Facility: [facility]  Chief Complaint  Patient presents with  . Acute Visit    elevated blood sugar   No Known Allergies  HPI 79 y/o female patient is seen today for her elevated blood sugar readings. Her cbg readings were reviewed and they are in range of 213-374. Reviewed her a1c and it is 12.1. With her dementia getting ROS is difficult  Review of Systems:  Unable to obtain  Past Medical History  Diagnosis Date  . Cancer   . Hypertension   . Diabetes mellitus   . GERD (gastroesophageal reflux disease)   . Thyroid disease   . Anxiety    Medication reviewed. See [facility]  Physical exam BP 130/70  Pulse 97  Temp(Src) 97.9 F (36.6 C)  Resp 19  Ht 5\' 2"  (1.575 m)  Wt 160 lb (72.576 kg)  BMI 29.26 kg/m2  General- elderly female in no acute distress Head- atraumatic, normocephalic Eyes- PERRLA, EOMI, no pallor Neck- no lymphadenopathy, no thyromegaly, no jugular vein distension, no carotid bruit Cardiovascular- normal s1,s2, no murmurs/ rubs/ gallops Respiratory- bilateral clear to auscultation, no wheeze, no rhonchi, no crackles Abdomen- bowel sounds present, soft, non tender Musculoskeletal- able to move all 4 extremities, no spinal and paraspinal tenderness Neurological- no focal deficit Psychiatry- alert and oriented to person only, calm  Lab Results  Component Value Date   HGBA1C 7.6* 05/24/2012   09/18/13 wbc 4, hb 14.3, hct 42.6, plt 147, na 139, k 3.8, glu 337, bun 19, cr 0.92, ca 9.3.tchol 128, tg 170, hdl 57, ldl 37, tsh 0.030, a1c 12.1, vit d 74  Assessment/plan  Hypothyroidism Reviewed tsh. With it being low, decrease levothyroxine to 100 mcg daily, check tsh in 4 weeks  Dm type 2 uncontrolled Newly diagnosed as patient was not on any treatment until recently. Continue tradjenta 5 mg daily. Will introduce lantus 5 u sq daily. Also add novolog 5 u for  cbg > 150 with meals and bedtime and check cbg. Reassess cbg reading in a week and adjust insulin further. On aspirin, atorvastatin and benazepril

## 2013-11-03 LAB — TSH: TSH: 0.4 u[IU]/mL — AB (ref <=5.90)

## 2013-12-12 ENCOUNTER — Non-Acute Institutional Stay (SKILLED_NURSING_FACILITY): Payer: Medicare Other | Admitting: Internal Medicine

## 2013-12-12 DIAGNOSIS — M47816 Spondylosis without myelopathy or radiculopathy, lumbar region: Secondary | ICD-10-CM

## 2013-12-12 DIAGNOSIS — E538 Deficiency of other specified B group vitamins: Secondary | ICD-10-CM

## 2013-12-12 DIAGNOSIS — F0391 Unspecified dementia with behavioral disturbance: Secondary | ICD-10-CM

## 2013-12-12 DIAGNOSIS — F329 Major depressive disorder, single episode, unspecified: Secondary | ICD-10-CM

## 2013-12-12 DIAGNOSIS — F03918 Unspecified dementia, unspecified severity, with other behavioral disturbance: Secondary | ICD-10-CM

## 2013-12-12 DIAGNOSIS — E119 Type 2 diabetes mellitus without complications: Secondary | ICD-10-CM

## 2013-12-12 DIAGNOSIS — M47817 Spondylosis without myelopathy or radiculopathy, lumbosacral region: Secondary | ICD-10-CM

## 2013-12-12 DIAGNOSIS — F419 Anxiety disorder, unspecified: Secondary | ICD-10-CM

## 2013-12-12 DIAGNOSIS — F32A Depression, unspecified: Secondary | ICD-10-CM

## 2013-12-12 DIAGNOSIS — F341 Dysthymic disorder: Secondary | ICD-10-CM

## 2013-12-12 DIAGNOSIS — I10 Essential (primary) hypertension: Secondary | ICD-10-CM

## 2013-12-12 NOTE — Progress Notes (Signed)
Patient ID: Alexis Reese, female   DOB: 1934/11/22, 79 y.o.   MRN: [id number]  Location: [facility] Provider:  Jonelle Sidle L. Mariea Clonts, D.O., C.M.D.  Code Status:  DNR  Chief Complaint  Patient presents with  . Medical Management of Chronic Issues    HPI:  79 yo white female here for long term care was seen for med mgt of her chronic diseases.  She c/o receiving the flu shot and it caused her arm to be swollen and sore at the site and says she still has pain there.  No other new concerns today.  Review of Systems:  Review of Systems  Constitutional: Positive for malaise/fatigue. Negative for fever.  HENT: Negative for congestion.   Respiratory: Negative for shortness of breath.   Cardiovascular: Negative for chest pain.  Gastrointestinal: Positive for constipation. Negative for abdominal pain.  Genitourinary: Negative for dysuria.  Musculoskeletal: Negative for falls.  Neurological: Positive for weakness. Negative for dizziness.  Psychiatric/Behavioral: Positive for depression and memory loss.    Medications: Patient's Medications  New Prescriptions   AMLODIPINE (NORVASC) 2.5 MG TABLET    Take 1 tablet (2.5 mg total) by mouth daily.  Previous Medications   ALBUTEROL (PROVENTIL HFA;VENTOLIN HFA) 108 (90 BASE) MCG/ACT INHALER    Inhale 2 puffs into the lungs every 4 (four) hours as needed for wheezing or shortness of breath.   ASPIRIN EC 81 MG TABLET    Take 81 mg by mouth daily.   ATORVASTATIN (LIPITOR) 10 MG TABLET    Take 10 mg by mouth daily.   BENAZEPRIL (LOTENSIN) 20 MG TABLET    Take 20 mg by mouth 2 (two) times daily.   CALCITONIN, SALMON, (MIACALCIN/FORTICAL) 200 UNIT/ACT NASAL SPRAY    Place 1 spray into the nose daily. Alternating sids of  Nose daily   CYANOCOBALAMIN (,VITAMIN B-12,) 1000 MCG/ML INJECTION    Inject 1,000 mcg into the skin every 30 (thirty) days.   DEXTROMETHORPHAN-QUINIDINE (NUEDEXTA) 20-10 MG CAPS    Take 1 tablet by mouth 2 (two) times  daily.   DIVALPROEX (DEPAKOTE SPRINKLE) 125 MG CAPSULE    Take 125 mg by mouth 2 (two) times daily.   DULOXETINE (CYMBALTA) 20 MG CAPSULE    Take 30 mg by mouth.    INSULIN ASPART (NOVOLOG) 100 UNIT/ML INJECTION    Inject 5 Units into the skin 3 (three) times daily before meals. For cbg >=150   INSULIN GLARGINE (LANTUS) 100 UNIT/ML INJECTION    Inject 15 Units into the skin at bedtime.   LEVOTHYROXINE (SYNTHROID, LEVOTHROID) 125 MCG TABLET    Take 100 mcg by mouth daily before breakfast.    LINAGLIPTIN (TRADJENTA) 5 MG TABS TABLET    Take 5 mg by mouth daily.   LORATADINE (CLARITIN) 10 MG TABLET    Take 10 mg by mouth daily.   MELATONIN 3 MG CAPS    Take 1 capsule by mouth daily.   POLYETHYLENE GLYCOL (MIRALAX / GLYCOLAX) PACKET    Take 17 g by mouth daily as needed.   QUETIAPINE (SEROQUEL) 50 MG TABLET    Take 25-150 mg by mouth 3 (three) times daily. 1 tablet in morning, half tablet in afternoon and 3 tablet at night   SENNOSIDES-DOCUSATE SODIUM (SENOKOT-S) 8.6-50 MG TABLET    Take 2 tablets by mouth daily.   TRAMADOL (ULTRAM) 50 MG TABLET    Take 50 mg by mouth every 6 (six) hours as needed.  Modified Medications   Modified Medication  Previous Medication   CLONAZEPAM (KLONOPIN) 0.5 MG TABLET clonazePAM (KLONOPIN) 0.5 MG tablet      Take one tablet by mouth every 8 hours for anxiety    Take 0.5 mg by mouth 3 (three) times daily. Take 1/2 tablet by mouth every 8 hours as needed for anxiety; Take one tablet by mouth every 12 hours for anxiety  Discontinued Medications   CETIRIZINE (ZYRTEC) 10 MG TABLET    Take 10 mg by mouth daily.   FLUTICASONE (FLONASE) 50 MCG/ACT NASAL SPRAY    Place 2 sprays into the nose every morning.    VITAMIN D, ERGOCALCIFEROL, (DRISDOL) 50000 UNITS CAPS    Take 50,000 Units by mouth every 7 (seven) days. Taken on Thursdays    Physical Exam: Filed Vitals:   12/12/13 1214  BP: 150/70  Pulse: 86  Temp: 97.5 F (36.4 C)  Resp: 18  Height: 5\' 2"  (1.575 m)    Weight: 169 lb (76.658 kg)   Physical Exam  Constitutional: She appears well-developed and well-nourished. No distress.  Pleasant lady seated in wheelchair  Cardiovascular: Intact distal pulses.   irreg irreg  Pulmonary/Chest: Effort normal and breath sounds normal. No respiratory distress.  Abdominal: Soft. Bowel sounds are normal. She exhibits no distension. There is no tenderness.  Musculoskeletal: Normal range of motion. She exhibits tenderness.  Of left upper arm in deltoid region  Neurological: She is alert.  Psychiatric:  anxious   Labs reviewed: Basic Metabolic Panel:  Recent Labs  05/03/13 2155 05/17/13 2207  NA 134* 134*  K 4.5 4.2  CL 91* 94*  CO2 32 28  GLUCOSE 187* 183*  BUN 9 6  CREATININE 0.78 0.77  CALCIUM 9.3 9.4    Liver Function Tests:  Recent Labs  05/03/13 2155 05/17/13 2207  AST 160* 26  ALT 86* 20  ALKPHOS 60 66  BILITOT 0.3 0.5  PROT 7.3 7.5  ALBUMIN 3.5 3.6    CBC:  Recent Labs  05/03/13 2155 05/17/13 2207  WBC 3.6* 8.1  NEUTROABS 2.1 6.3  HGB 14.9 13.9  HCT 43.3 40.8  MCV 91.0 89.7  PLT 161 252   11/03/13:  TSH nl at 0.457  Assessment/Plan 1. Type 2 diabetes mellitus without complication - cont ace, statin, monitor cbgs weekly  2. Dementia with behavioral disturbance -not on aricept/namenda -cont on seroquel and depakote per psychiatry   3. B12 deficiency -cont monthly b12 injections  4. Spondylosis of lumbar region without myelopathy or radiculopathy -pain is well controlled, also had compression fx and is on calcitonin, vitamin D for osteoporosis  5. Anxiety and depression -cont cymbalta for depression and chronic pain and klonopin per psych, melatonin for sleep  6. Essential hypertension, benign -bp at goal with ace alone  Family/ staff Communication: seen with DNS  Goals of care: family has said long term care for her

## 2013-12-26 ENCOUNTER — Non-Acute Institutional Stay (SKILLED_NURSING_FACILITY): Payer: Medicare Other | Admitting: Internal Medicine

## 2013-12-26 ENCOUNTER — Encounter: Payer: Self-pay | Admitting: Internal Medicine

## 2013-12-26 DIAGNOSIS — M47816 Spondylosis without myelopathy or radiculopathy, lumbar region: Secondary | ICD-10-CM

## 2013-12-26 DIAGNOSIS — S32010S Wedge compression fracture of first lumbar vertebra, sequela: Secondary | ICD-10-CM

## 2013-12-26 DIAGNOSIS — M47817 Spondylosis without myelopathy or radiculopathy, lumbosacral region: Secondary | ICD-10-CM

## 2013-12-26 DIAGNOSIS — IMO0002 Reserved for concepts with insufficient information to code with codable children: Secondary | ICD-10-CM

## 2013-12-26 DIAGNOSIS — S22080S Wedge compression fracture of T11-T12 vertebra, sequela: Secondary | ICD-10-CM

## 2013-12-26 NOTE — Progress Notes (Signed)
Patient ID: Alexis Reese, female   DOB: 24-Feb-1935, 79 y.o.   MRN: [id number]  Location:  [facility] Provider:  Jonelle Sidle L. Mariea Clonts, D.O., C.M.D.  Code Status:  DNR  Chief Complaint  Patient presents with  . Acute Visit    back pain    HPI:  79 yo white female long term care resident seen for acute visit due to back pain.  Only current pain medication is her cymbalta 30mg .  She is not asking for her tylenol per the [facility] so it is not being given.  Due to her memory loss, se does not remember to ask.  Pain is lower thoracic/upper lumbar (compression fx). Review of Systems:  ROS  Medications: Patient's Medications  New Prescriptions   No medications on file  Previous Medications   ALBUTEROL (PROVENTIL HFA;VENTOLIN HFA) 108 (90 BASE) MCG/ACT INHALER    Inhale 2 puffs into the lungs every 4 (four) hours as needed for wheezing or shortness of breath.   ASPIRIN EC 81 MG TABLET    Take 81 mg by mouth daily.   ATORVASTATIN (LIPITOR) 10 MG TABLET    Take 10 mg by mouth daily.   BENAZEPRIL (LOTENSIN) 20 MG TABLET    Take 20 mg by mouth 2 (two) times daily.   CALCITONIN, SALMON, (MIACALCIN/FORTICAL) 200 UNIT/ACT NASAL SPRAY    Place 1 spray into the nose daily. Alternating sids of  Nose daily   CETIRIZINE (ZYRTEC) 10 MG TABLET    Take 10 mg by mouth daily.   CLONAZEPAM (KLONOPIN) 0.5 MG TABLET    Take 0.5 mg by mouth 3 (three) times daily. Take 1/2 tablet by mouth every 8 hours as needed for anxiety; Take one tablet by mouth every 12 hours for anxiety   CYANOCOBALAMIN (,VITAMIN B-12,) 1000 MCG/ML INJECTION    Inject 1,000 mcg into the skin every 30 (thirty) days.   DEXTROMETHORPHAN-QUINIDINE (NUEDEXTA) 20-10 MG CAPS    Take 1 tablet by mouth 2 (two) times daily.   DIVALPROEX (DEPAKOTE SPRINKLE) 125 MG CAPSULE    Take 125 mg by mouth 2 (two) times daily.   DULOXETINE (CYMBALTA) 20 MG CAPSULE    Take 30 mg by mouth.    FLUTICASONE (FLONASE) 50 MCG/ACT NASAL SPRAY    Place 2 sprays  into the nose every morning.    LEVOTHYROXINE (SYNTHROID, LEVOTHROID) 125 MCG TABLET    Take 100 mcg by mouth daily before breakfast.    MELATONIN 3 MG CAPS    Take 1 capsule by mouth daily.   POLYETHYLENE GLYCOL (MIRALAX / GLYCOLAX) PACKET    Take 17 g by mouth daily as needed.   QUETIAPINE (SEROQUEL) 50 MG TABLET    Take 25-150 mg by mouth 3 (three) times daily. 1 tablet in morning, half tablet in afternoon and 3 tablet at night   SENNOSIDES-DOCUSATE SODIUM (SENOKOT-S) 8.6-50 MG TABLET    Take 2 tablets by mouth daily.   VITAMIN D, ERGOCALCIFEROL, (DRISDOL) 50000 UNITS CAPS    Take 50,000 Units by mouth every 7 (seven) days. Taken on Thursdays  Modified Medications   No medications on file  Discontinued Medications   No medications on file    Physical Exam: Filed Vitals:   12/26/13 1309  BP: 138/70  Pulse: 78  Temp: 97 F (36.1 C)  Resp: 18  Height: 5\' 2"  (1.575 m)  Weight: 169 lb (76.658 kg)  Physical Exam  Labs reviewed: Basic Metabolic Panel:  Recent Labs  03/24/13 0510 05/03/13  2155 05/17/13 2207  NA 134* 134* 134*  K 4.2 4.5 4.2  CL 94* 91* 94*  CO2 28 32 28  GLUCOSE 135* 187* 183*  BUN 7 9 6   CREATININE 0.74 0.78 0.77  CALCIUM 9.2 9.3 9.4    Liver Function Tests:  Recent Labs  03/24/13 0510 05/03/13 2155 05/17/13 2207  AST 23 160* 26  ALT 18 86* 20  ALKPHOS 51 60 66  BILITOT 0.4 0.3 0.5  PROT 6.9 7.3 7.5  ALBUMIN 3.7 3.5 3.6    CBC:  Recent Labs  03/24/13 0510 05/03/13 2155 05/17/13 2207  WBC 5.1 3.6* 8.1  NEUTROABS 3.0 2.1 6.3  HGB 14.7 14.9 13.9  HCT 42.2 43.3 40.8  MCV 90.0 91.0 89.7  PLT 218 161 252    Significant Diagnostic Results:  Lumbar xrays 12/05/2012:  1. The patient has multiple thoracolumbar compression fractures. These fractures appear stable compared to the CT of 01/03/2011. No acute fracture deformities are identified. 2. Stable grade III anterolisthesis of L5 on S1 and stable grade 1 retrolisthesis of L2 on  L3.  Assessment/Plan 1. Spondylosis of lumbar region without myelopathy or radiculopathy -longstanding -cont cymbalta (could increase to 60mg  if tolerates also for max benefit) -also will schedule her tylenol b/c she is not asking for it  2. Compression fracture of thoracolumbar vertebra, sequela -also contributing to pain -is already on calcitonin nasal spray for this  Family/ staff Communication: seen with unit supervisor  Goals of care: long term care  Labs/tests ordered:  No new

## 2014-02-05 ENCOUNTER — Encounter: Payer: Self-pay | Admitting: Internal Medicine

## 2014-03-03 ENCOUNTER — Encounter: Payer: Self-pay | Admitting: Internal Medicine

## 2014-03-15 ENCOUNTER — Non-Acute Institutional Stay (SKILLED_NURSING_FACILITY): Payer: Medicare Other | Admitting: Adult Health

## 2014-03-15 DIAGNOSIS — F03918 Unspecified dementia, unspecified severity, with other behavioral disturbance: Secondary | ICD-10-CM

## 2014-03-15 DIAGNOSIS — F32A Depression, unspecified: Secondary | ICD-10-CM

## 2014-03-15 DIAGNOSIS — E559 Vitamin D deficiency, unspecified: Secondary | ICD-10-CM

## 2014-03-15 DIAGNOSIS — F419 Anxiety disorder, unspecified: Secondary | ICD-10-CM

## 2014-03-15 DIAGNOSIS — E039 Hypothyroidism, unspecified: Secondary | ICD-10-CM

## 2014-03-15 DIAGNOSIS — E785 Hyperlipidemia, unspecified: Secondary | ICD-10-CM

## 2014-03-15 DIAGNOSIS — F418 Other specified anxiety disorders: Secondary | ICD-10-CM

## 2014-03-15 DIAGNOSIS — E118 Type 2 diabetes mellitus with unspecified complications: Secondary | ICD-10-CM

## 2014-03-15 DIAGNOSIS — F482 Pseudobulbar affect: Secondary | ICD-10-CM

## 2014-03-15 DIAGNOSIS — E1165 Type 2 diabetes mellitus with hyperglycemia: Secondary | ICD-10-CM

## 2014-03-15 DIAGNOSIS — F329 Major depressive disorder, single episode, unspecified: Secondary | ICD-10-CM

## 2014-03-15 DIAGNOSIS — F0391 Unspecified dementia with behavioral disturbance: Secondary | ICD-10-CM

## 2014-03-15 DIAGNOSIS — I10 Essential (primary) hypertension: Secondary | ICD-10-CM

## 2014-03-15 DIAGNOSIS — IMO0002 Reserved for concepts with insufficient information to code with codable children: Secondary | ICD-10-CM

## 2014-03-19 LAB — HEMOGLOBIN A1C: Hgb A1c MFr Bld: 6.6 % — AB (ref 4.0–6.0)

## 2014-03-20 ENCOUNTER — Other Ambulatory Visit: Payer: Self-pay | Admitting: *Deleted

## 2014-03-20 MED ORDER — CLONAZEPAM 0.5 MG PO TABS: ORAL_TABLET | ORAL | Status: DC

## 2014-03-20 NOTE — Telephone Encounter (Signed)
[facility] 

## 2014-04-01 ENCOUNTER — Encounter: Payer: Self-pay | Admitting: Adult Health

## 2014-04-01 DIAGNOSIS — IMO0002 Reserved for concepts with insufficient information to code with codable children: Secondary | ICD-10-CM | POA: Insufficient documentation

## 2014-04-01 DIAGNOSIS — E118 Type 2 diabetes mellitus with unspecified complications: Secondary | ICD-10-CM

## 2014-04-01 DIAGNOSIS — E1165 Type 2 diabetes mellitus with hyperglycemia: Secondary | ICD-10-CM | POA: Insufficient documentation

## 2014-04-01 MED ORDER — AMLODIPINE BESYLATE 2.5 MG PO TABS: 2.5000 mg | ORAL_TABLET | Freq: Every day | ORAL | Status: DC

## 2014-04-01 NOTE — Progress Notes (Signed)
Patient ID: Alexis Reese, female   DOB: 1934/09/28, 79 y.o.   MRN: [id number]  [facility]     No Known Allergies     Chief Complaint  Patient presents with  . Medical Management of Chronic Issues    HPI:  She is a long term resident of this facility being seen for the management of her chronic illnesses. She has long term anxiety and depression with somatic complaints present. The more ros questions asked the more complaints she has. There are no nursing concerns being voiced today.     Past Medical History  Diagnosis Date  . Cancer   . Hypertension   . Diabetes mellitus   . GERD (gastroesophageal reflux disease)   . Thyroid disease   . Anxiety     Past Surgical History  Procedure Laterality Date  . Mastectomy    . Cholecystectomy      VITAL SIGNS BP 140/90 mmHg  Pulse 86  Ht 5\' 2"  (1.575 m)  Wt 174 lb (78.926 kg)  BMI 31.82 kg/m2   Outpatient Encounter Prescriptions as of 03/15/2014  Medication Sig  . albuterol (PROVENTIL HFA;VENTOLIN HFA) 108 (90 BASE) MCG/ACT inhaler Inhale 2 puffs into the lungs every 4 (four) hours as needed for wheezing or shortness of breath.  Marland Kitchen aspirin EC 81 MG tablet Take 81 mg by mouth daily.  Marland Kitchen atorvastatin (LIPITOR) 10 MG tablet Take 10 mg by mouth daily.  . benazepril (LOTENSIN) 20 MG tablet Take 20 mg by mouth 2 (two) times daily.  . calcitonin, salmon, (MIACALCIN/FORTICAL) 200 UNIT/ACT nasal spray Place 1 spray into the nose daily. Alternating sids of  Nose daily  . clonazePAM (KLONOPIN) 0.5 MG tablet Take one tablet by mouth every 8 hours for anxiety  . cyanocobalamin (,VITAMIN B-12,) 1000 MCG/ML injection Inject 1,000 mcg into the skin every 30 (thirty) days.  . Dextromethorphan-Quinidine (NUEDEXTA) 20-10 MG CAPS Take 1 tablet by mouth 2 (two) times daily.  . divalproex (DEPAKOTE SPRINKLE) 125 MG capsule Take 125 mg by mouth 2 (two) times daily.  . DULoxetine (CYMBALTA) 20 MG capsule Take 30 mg by mouth.   . insulin  aspart (NOVOLOG) 100 UNIT/ML injection Inject 5 Units into the skin 3 (three) times daily before meals. For cbg >=150  . insulin glargine (LANTUS) 100 UNIT/ML injection Inject 15 Units into the skin at bedtime.  Marland Kitchen levothyroxine (SYNTHROID, LEVOTHROID) 125 MCG tablet Take 100 mcg by mouth daily before breakfast.   . linagliptin (TRADJENTA) 5 MG TABS tablet Take 5 mg by mouth daily.  Marland Kitchen loratadine (CLARITIN) 10 MG tablet Take 10 mg by mouth daily.  . Melatonin 3 MG CAPS Take 1 capsule by mouth daily.  . polyethylene glycol (MIRALAX / GLYCOLAX) packet Take 17 g by mouth daily as needed.  Marland Kitchen QUEtiapine (SEROQUEL) 50 MG tablet Take 25-150 mg by mouth 3 (three) times daily. 1 tablet in morning, half tablet in afternoon and 3 tablet at night  . sennosides-docusate sodium (SENOKOT-S) 8.6-50 MG tablet Take 2 tablets by mouth daily.  . traMADol (ULTRAM) 50 MG tablet Take 50 mg by mouth every 6 (six) hours as needed.  . Vitamin D, Ergocalciferol, (DRISDOL) 50000 UNITS CAPS Take 50,000 Units by mouth every 7 (seven) days. Taken on Thursdays  . [DISCONTINUED] cetirizine (ZYRTEC) 10 MG tablet Take 10 mg by mouth daily.  . [DISCONTINUED] fluticasone (FLONASE) 50 MCG/ACT nasal spray Place 2 sprays into the nose every morning.      SIGNIFICANT DIAGNOSTIC EXAMS  02-12-14: right foot x-ray: no acute abnormality  LABS REVIEWED:   09-19-13: wbc 4.0; hgb 14.3; hct 42.6; mcv 92.8; plt 147; glucose 337; bun 10; creat 0.92; k+3.8; na++139; liver normal albumin 3.5; chol 128; ldl 37; trig 170; tsh 0.030; hgb a1c 12.1; vit d 74 09-18-13: urine micro-albumin 2.44 10-16-13: urine culture: e-coli: rocephin 11-03-13: tsh 0.457    Review of Systems  Constitutional: Positive for malaise/fatigue.  Respiratory: Negative for cough.   Cardiovascular: Positive for chest pain and palpitations. Negative for leg swelling.  Gastrointestinal: Positive for abdominal pain. Negative for heartburn and constipation.  Musculoskeletal:  Positive for myalgias, back pain and joint pain.  Skin: Negative.   Neurological: Positive for headaches.  Psychiatric/Behavioral: Positive for depression. The patient is nervous/anxious.      Physical Exam  Constitutional: She appears well-developed and well-nourished. No distress.  Neck: Neck supple. No JVD present. No thyromegaly present.  Cardiovascular: Normal rate, regular rhythm and intact distal pulses.   Respiratory: Effort normal and breath sounds normal. No respiratory distress.  GI: Soft. Bowel sounds are normal. She exhibits no distension. There is no tenderness.  Musculoskeletal: She exhibits no edema.  Is able to move all extremities   Neurological: She is alert.  Skin: Skin is warm and dry. She is not diaphoretic.  Psychiatric:  Is anxious        ASSESSMENT/ PLAN:  1. Diabetes: will continue lantus 15 units daily; novolog 5 units prior to meals for cbg >=150; and tradjenta 5 mg daily. Is taking an ace inhibitor; statin and asa. Will monitor hgb a1c 12.1  2. Hypertension: is not being adequately controlled. Will continue lisinopril 20 mg twice daily; asa 81 mg daily will begin norvasc 2.5 mg daily; will have nursing check b/p and pulse twice daily for one week and report. Will monitor   3. Dyslipidemia: will continue lipitor 10 mg daily; ldl is 37  4. Depression with anxiety: she is emotionally without change and is taking depakote sprinkles 125 mg twice dialy to help stabilize mood; klonopin 0.5 mg every 8 hours for anxiety; will increase cymbalta to 40 mg daily and will continue seroquel 25 mg in am; 50 mg in afternoon and 150 mg nightly will monitor   5. Hypothyroidism: will continue synthroid 100 mcg daily; tsh is 0.457  6. PBA: will continue nuedexta 20/10 twice daily   7. Allergic rhinitis: will continue claritin 10 mg daily   8. Osteoporosis: will continue calcitonin 1 puff daily  9. Insomnia: will continue melatonin 3 mg nightly  10. Constipation:  will continue senna s 2 tabs nightly and miralax daily as needed  11. Dementia: no change in status; is presently not on medications; will not make changes will monitor  12. Vit d deficiency: will stop the vit d   Will check hgb a1c    Ok Edwards NP [facility]  Contact [phone] Monday through Friday 8am- 5pm  After hours call [phone]

## 2014-04-22 ENCOUNTER — Encounter: Payer: Self-pay | Admitting: Internal Medicine

## 2014-05-01 ENCOUNTER — Encounter: Payer: Self-pay | Admitting: Internal Medicine

## 2014-05-01 ENCOUNTER — Non-Acute Institutional Stay (SKILLED_NURSING_FACILITY): Payer: Medicare Other | Admitting: Internal Medicine

## 2014-05-01 DIAGNOSIS — F482 Pseudobulbar affect: Secondary | ICD-10-CM

## 2014-05-01 DIAGNOSIS — F03918 Unspecified dementia, unspecified severity, with other behavioral disturbance: Secondary | ICD-10-CM

## 2014-05-01 DIAGNOSIS — J302 Other seasonal allergic rhinitis: Secondary | ICD-10-CM

## 2014-05-01 DIAGNOSIS — E119 Type 2 diabetes mellitus without complications: Secondary | ICD-10-CM

## 2014-05-01 DIAGNOSIS — I10 Essential (primary) hypertension: Secondary | ICD-10-CM

## 2014-05-01 DIAGNOSIS — F0391 Unspecified dementia with behavioral disturbance: Secondary | ICD-10-CM

## 2014-05-01 DIAGNOSIS — E039 Hypothyroidism, unspecified: Secondary | ICD-10-CM

## 2014-05-01 NOTE — Progress Notes (Signed)
Patient ID: Alexis Reese, female   DOB: [birth year], 80 y.o.   MRN: [id number]    [place] of Service: SNF (31)    No Known Allergies  Chief Complaint  Patient presents with  . Medical Management of Chronic Issues    psuedobulbar affect, dementia, DM II, hypothyroidism, MDD, anxiety, HTN, GERD    HPI:  80 yo female long term resident seen today for above. She c/o seasonal allergy sx's with left facial TTP. She takes an allergy pil daily. Appetite is okay and she is sleeping well. No nursing issues. CBGs usually 120-180s, occasionally 200s and rarely 300s. No low BS reactions  CODE STATUS: Full  Medications: Patient's Medications  New Prescriptions   No medications on file  Previous Medications   ACETAMINOPHEN (TYLENOL) 325 MG TABLET    Take 650 mg by mouth every 6 (six) hours as needed for mild pain or fever.   ALBUTEROL (PROVENTIL HFA;VENTOLIN HFA) 108 (90 BASE) MCG/ACT INHALER    Inhale 2 puffs into the lungs every 4 (four) hours as needed for wheezing or shortness of breath.   AMLODIPINE (NORVASC) 2.5 MG TABLET    Take 1 tablet (2.5 mg total) by mouth daily.   ASPIRIN EC 81 MG TABLET    Take 81 mg by mouth daily.   ATORVASTATIN (LIPITOR) 10 MG TABLET    Take 10 mg by mouth daily.   BENAZEPRIL (LOTENSIN) 20 MG TABLET    Take 20 mg by mouth 2 (two) times daily.   CALCITONIN, SALMON, (MIACALCIN/FORTICAL) 200 UNIT/ACT NASAL SPRAY    Place 1 spray into the nose daily. Alternating sids of  Nose daily   CETIRIZINE (ZYRTEC) 10 MG TABLET    Take 10 mg by mouth daily.   CLONAZEPAM (KLONOPIN) 0.5 MG TABLET    Take one tablet by mouth every 8 hours for anxiety   CYANOCOBALAMIN (,VITAMIN B-12,) 1000 MCG/ML INJECTION    Inject 1,000 mcg into the skin every 30 (thirty) days.   DEXTROMETHORPHAN-QUINIDINE (NUEDEXTA) 20-10 MG CAPS    Take 1 tablet by mouth 2 (two) times daily.   DIVALPROEX (DEPAKOTE SPRINKLE) 125 MG CAPSULE    Take 125 mg by mouth 2  (two) times daily.   DULOXETINE (CYMBALTA) 20 MG CAPSULE    Take 30 mg by mouth.    INSULIN ASPART (NOVOLOG) 100 UNIT/ML INJECTION    Inject 5 Units into the skin 3 (three) times daily before meals. For cbg >=150   INSULIN DETEMIR (LEVEMIR) 100 UNIT/ML INJECTION    Inject 15 Units into the skin at bedtime.   INSULIN GLARGINE (LANTUS) 100 UNIT/ML INJECTION    Inject 15 Units into the skin at bedtime.   LEVOTHYROXINE (SYNTHROID, LEVOTHROID) 125 MCG TABLET    Take 100 mcg by mouth daily before breakfast.    LINAGLIPTIN (TRADJENTA) 5 MG TABS TABLET    Take 5 mg by mouth daily.   LORATADINE (CLARITIN) 10 MG TABLET    Take 10 mg by mouth daily.   MELATONIN 3 MG CAPS    Take 1 capsule by mouth daily.   POLYETHYLENE GLYCOL (MIRALAX / GLYCOLAX) PACKET    Take 17 g by mouth daily as needed.   QUETIAPINE (SEROQUEL) 50 MG TABLET    Take 25-150 mg by mouth 3 (three) times daily. 1 tablet in morning, half tablet in afternoon and 3 tablet at night   SENNOSIDES-DOCUSATE SODIUM (SENOKOT-S) 8.6-50 MG TABLET    Take 2  tablets by mouth daily.  Modified Medications   Modified Medication Previous Medication   TRAMADOL (ULTRAM) 50 MG TABLET traMADol (ULTRAM) 50 MG tablet      Take one tablet by mouth every 8 hours as needed for pain    Take 50 mg by mouth every 6 (six) hours as needed.  Discontinued Medications   No medications on file     Review of Systems  Unable to obtain due to pt's mental status  Filed Vitals:   05/01/14 1555  BP: 139/94  Pulse: 70  Temp: 97 F (36.1 C)   There is no weight on file to calculate BMI.  Physical Exam CONSTITUTIONAL: Looks frail in NAD. Awake and alert HEENT: PERRLA. Oropharynx clear and without exudate NECK: Supple. Nontender. No palpable cervical or supraclavicular lymph nodes. No carotid bruit b/l. No thyromegaly or thyroid mass palpable.  CVS: Regular rate without murmur, gallop or rub. LUNGS: CTA b/l no wheezing, rales or rhonchi. ABDOMEN: Bowel sounds present  x 4. Soft, nontender, nondistended. No palpable mass or bruit EXTREMITIES: +1 pitting LE edema b/l. Distal pulses palpable. No calf tenderness PSYCH: Affect, behavior and mood normal   Labs reviewed:  A1c 6.6 (03/19/14)  TSH 0.4 (11/03/13)   Assessment/Plan   ICD-9-CM ICD-10-CM   1. Other seasonal allergic rhinitis  477.8 J30.2   2. Type 2 diabetes mellitus without complication - controlled 250.00 E11.9   3. Essential hypertension, benign - stable 401.1 I10   4. Dementia with behavioral disturbance - stable 294.21 F03.91   5. Hypothyroidism, unspecified hypothyroidism type - controlled 244.9 E03.9   6. PBA (pseudobulbar affect) - stable 310.81 F48.2    --make sure she takes her allergy med on a daily basis  --continue medications as ordered  --continue restorative AROM program  --continue CBGs as ordered  --she is medically stable on current tx plan. Will follow    Sigismund Cross S. Perlie Gold  [facility] [address] [place], [place] [address] [phone] Office (Wednesdays and Fridays 8 AM - 5 PM) [phone] Cell (Monday-Friday 8 AM - 5 PM)

## 2014-05-02 ENCOUNTER — Other Ambulatory Visit: Payer: Self-pay | Admitting: *Deleted

## 2014-05-02 ENCOUNTER — Other Ambulatory Visit: Payer: Self-pay | Admitting: Internal Medicine

## 2014-05-02 LAB — TSH: TSH: 0.32 u[IU]/mL — ABNORMAL LOW (ref 0.350–4.500)

## 2014-05-02 MED ORDER — TRAMADOL HCL 50 MG PO TABS: ORAL_TABLET | ORAL | Status: DC

## 2014-05-02 NOTE — Telephone Encounter (Signed)
[facility] 

## 2014-05-04 LAB — DIRECT DIALYSIS FREE T4: T4, Free Direct Dialysis: 2.2 ng/dL (ref 0.8–2.7)

## 2014-06-11 ENCOUNTER — Non-Acute Institutional Stay (SKILLED_NURSING_FACILITY): Payer: Medicare Other | Admitting: Adult Health

## 2014-06-11 DIAGNOSIS — E118 Type 2 diabetes mellitus with unspecified complications: Secondary | ICD-10-CM | POA: Diagnosis not present

## 2014-06-11 DIAGNOSIS — F29 Unspecified psychosis not due to a substance or known physiological condition: Secondary | ICD-10-CM

## 2014-06-11 DIAGNOSIS — F482 Pseudobulbar affect: Secondary | ICD-10-CM

## 2014-06-11 DIAGNOSIS — IMO0002 Reserved for concepts with insufficient information to code with codable children: Secondary | ICD-10-CM

## 2014-06-11 DIAGNOSIS — E785 Hyperlipidemia, unspecified: Secondary | ICD-10-CM | POA: Diagnosis not present

## 2014-06-11 DIAGNOSIS — I10 Essential (primary) hypertension: Secondary | ICD-10-CM

## 2014-06-11 DIAGNOSIS — K5900 Constipation, unspecified: Secondary | ICD-10-CM

## 2014-06-11 DIAGNOSIS — F418 Other specified anxiety disorders: Secondary | ICD-10-CM

## 2014-06-11 DIAGNOSIS — F0391 Unspecified dementia with behavioral disturbance: Secondary | ICD-10-CM

## 2014-06-11 DIAGNOSIS — E1165 Type 2 diabetes mellitus with hyperglycemia: Secondary | ICD-10-CM

## 2014-06-11 DIAGNOSIS — F03918 Unspecified dementia, unspecified severity, with other behavioral disturbance: Secondary | ICD-10-CM

## 2014-06-11 DIAGNOSIS — E039 Hypothyroidism, unspecified: Secondary | ICD-10-CM | POA: Diagnosis not present

## 2014-06-12 ENCOUNTER — Other Ambulatory Visit: Payer: Self-pay | Admitting: Internal Medicine

## 2014-06-12 DIAGNOSIS — Z780 Asymptomatic menopausal state: Secondary | ICD-10-CM

## 2014-06-28 ENCOUNTER — Other Ambulatory Visit: Payer: Self-pay | Admitting: Internal Medicine

## 2014-06-28 ENCOUNTER — Ambulatory Visit (HOSPITAL_COMMUNITY)
Admission: RE | Admit: 2014-06-28 | Discharge: 2014-06-28 | Disposition: A | Discharge status: Home / Self Care | Payer: Medicare Other | Source: Ambulatory Visit | Attending: Internal Medicine | Admitting: Internal Medicine

## 2014-06-28 DIAGNOSIS — Z1231 Encounter for screening mammogram for malignant neoplasm of breast: Secondary | ICD-10-CM | POA: Insufficient documentation

## 2014-06-28 DIAGNOSIS — Z780 Asymptomatic menopausal state: Secondary | ICD-10-CM

## 2014-06-28 DIAGNOSIS — Z13820 Encounter for screening for osteoporosis: Secondary | ICD-10-CM | POA: Insufficient documentation

## 2014-07-03 ENCOUNTER — Other Ambulatory Visit (HOSPITAL_COMMUNITY): Payer: Self-pay

## 2014-07-08 ENCOUNTER — Encounter: Payer: Self-pay | Admitting: Adult Health

## 2014-07-08 NOTE — Progress Notes (Signed)
Patient ID: Alexis Reese, female   DOB: Jul 02, 1934, 80 y.o.   MRN: [id number]  [name] living [place]     No Known Allergies     Chief Complaint  Patient presents with  . Annual Exam    HPI:  She is a long term resident of this facility being seen for her annual exam. Overall she has remained stable over the past year. She has been without significant illness and has not been hospitalized. She states that she is feeling good today. There are no nursing concerns today.    Past Medical History  Diagnosis Date  . Cancer   . Hypertension   . Diabetes mellitus   . GERD (gastroesophageal reflux disease)   . Thyroid disease   . Anxiety     Past Surgical History  Procedure Laterality Date  . Mastectomy    . Cholecystectomy      VITAL SIGNS BP 107/67 mmHg  Pulse 86  Ht 5\' 2"  (1.575 m)  Wt 173 lb (78.472 kg)  BMI 31.63 kg/m2   Outpatient Encounter Prescriptions as of 06/11/2014  Medication Sig  . acetaminophen (TYLENOL) 325 MG tablet Take 650 mg by mouth every 6 (six) hours as needed for mild pain or fever.  Marland Kitchen albuterol (ACCUNEB) 0.63 MG/3ML nebulizer solution Take 1 ampule by nebulization every 4 (four) hours as needed for wheezing.  Marland Kitchen amLODipine (NORVASC) 2.5 MG tablet Take 1 tablet (2.5 mg total) by mouth daily.  Marland Kitchen aspirin EC 81 MG tablet Take 81 mg by mouth daily.  Marland Kitchen atorvastatin (LIPITOR) 10 MG tablet Take 10 mg by mouth daily.  . benazepril (LOTENSIN) 20 MG tablet Take 20 mg by mouth 2 (two) times daily.  . calcitonin, salmon, (MIACALCIN/FORTICAL) 200 UNIT/ACT nasal spray Place 1 spray into the nose daily. Alternating sids of  Nose daily  . cetirizine (ZYRTEC) 10 MG tablet Take 10 mg by mouth daily.  . clonazePAM (KLONOPIN) 0.5 MG tablet Take one tablet by mouth every 8 hours for anxiety  . cyanocobalamin (,VITAMIN B-12,) 1000 MCG/ML injection Inject 1,000 mcg into the skin every 30 (thirty) days.  . Dextromethorphan-Quinidine (NUEDEXTA) 20-10 MG CAPS Take 1  tablet by mouth 2 (two) times daily.  . divalproex (DEPAKOTE SPRINKLE) 125 MG capsule Take 125 mg by mouth 2 (two) times daily.  . DULoxetine (CYMBALTA) 20 MG capsule Take 40 mg by mouth daily.   . fluticasone (FLONASE) 50 MCG/ACT nasal spray Place 1 spray into both nostrils daily.  . insulin aspart (NOVOLOG) 100 UNIT/ML injection Inject 5 Units into the skin 3 (three) times daily before meals. For cbg >=150  . insulin detemir (LEVEMIR) 100 UNIT/ML injection Inject 15 Units into the skin at bedtime.  Marland Kitchen levothyroxine (SYNTHROID, LEVOTHROID) 125 MCG tablet Take 88 mcg by mouth daily before breakfast.   . linagliptin (TRADJENTA) 5 MG TABS tablet Take 5 mg by mouth daily.  . Melatonin 3 MG CAPS Take 1 capsule by mouth daily.  . polyethylene glycol (MIRALAX / GLYCOLAX) packet Take 17 g by mouth daily as needed.  Marland Kitchen QUEtiapine (SEROQUEL) 25 MG tablet Take 25 mg by mouth 2 (two) times daily.  . QUEtiapine (SEROQUEL) 50 MG tablet Take 150 mg by mouth at bedtime.   . sennosides-docusate sodium (SENOKOT-S) 8.6-50 MG tablet Take 2 tablets by mouth daily.  . traMADol (ULTRAM) 50 MG tablet Take one tablet by mouth every 8 hours as needed for pain     SIGNIFICANT DIAGNOSTIC EXAMS   02-12-14: right foot  x-ray: no acute abnormality  LABS REVIEWED:   09-19-13: wbc 4.0; hgb 14.3; hct 42.6; mcv 92.8; plt 147; glucose 337; bun 10; creat 0.92; k+3.8; na++139; liver normal albumin 3.5; chol 128; ldl 37; trig 170; tsh 0.030; hgb a1c 12.1; vit d 74 09-18-13: urine micro-albumin 2.44 10-16-13: urine culture: e-coli: rocephin 11-03-13: tsh 0.457   Review of Systems  Constitutional: Negative for malaise/fatigue.  Respiratory: Negative for cough and shortness of breath.   Cardiovascular: Negative for chest pain and palpitations.  Gastrointestinal: Negative for heartburn, abdominal pain and constipation.  Musculoskeletal: Negative for myalgias and joint pain.  Skin: Negative.   Neurological: Negative for  headaches.  Psychiatric/Behavioral: The patient is not nervous/anxious.      Physical Exam Constitutional: She appears well-developed and well-nourished. No distress.  Neck: Neck supple. No JVD present. No thyromegaly present.  Cardiovascular: Normal rate, regular rhythm and intact distal pulses.   Respiratory: Effort normal and breath sounds normal. No respiratory distress.  GI: Soft. Bowel sounds are normal. She exhibits no distension. There is no tenderness.  Musculoskeletal: She exhibits no edema.  Is able to move all extremities   Neurological: She is alert.  Skin: Skin is warm and dry. She is not diaphoretic.  Psychiatric: is calm         ASSESSMENT/ PLAN:  1. Diabetes: will continue levemir 15 units daily; novolog 5 units prior to meals for cbg >=150; and tradjenta 5 mg daily. Is taking an ace inhibitor; statin and asa. Will monitor hgb a1c 12.1  2. Hypertension: is not being adequately controlled. Will continue lisinopril 20 mg twice daily; asa 81 mg daily will begin norvasc 2.5 mg daily; will have nursing check b/p and pulse twice daily for one week and report. Will monitor   3. Dyslipidemia: will continue lipitor 10 mg daily; ldl is 37  4. Depression with anxiety: she is emotionally without change and is taking depakote sprinkles 125 mg twice daily to help stabilize mood; klonopin 0.5 mg every 8 hours for anxiety; will continue  cymbalta  40 mg daily and will continue seroquel 25 mg twice daily  and 150 mg nightly will monitor   5. Hypothyroidism: will continue synthroid 88 mcg daily; tsh is 0.457  6. PBA: will continue nuedexta 20/10 twice daily   7. Allergic rhinitis: will continue zyrtec 10 mg daily and flonase daily   8. Osteoporosis: will continue calcitonin 1 puff daily  9. Insomnia: will continue melatonin 3 mg nightly  10. Constipation: will continue senna s 2 tabs nightly and miralax daily as needed  11. Dementia: no change in status; is presently not on  medications; will not make changes will monitor  Will check cbc; cmp hgb a1c lipids; and will guaiac stools  Will put on eye doctor list     Ok Edwards NP [facility]  Contact [phone] Monday through Friday 8am- 5pm  After hours call [phone]

## 2014-07-10 ENCOUNTER — Ambulatory Visit (HOSPITAL_COMMUNITY)
Admission: RE | Admit: 2014-07-10 | Discharge: 2014-07-10 | Disposition: A | Discharge status: Home / Self Care | Payer: Medicare Other | Source: Ambulatory Visit | Attending: Internal Medicine | Admitting: Internal Medicine

## 2014-07-10 ENCOUNTER — Other Ambulatory Visit: Payer: Self-pay | Admitting: Internal Medicine

## 2014-07-10 DIAGNOSIS — Z9012 Acquired absence of left breast and nipple: Secondary | ICD-10-CM | POA: Diagnosis not present

## 2014-07-10 DIAGNOSIS — Z1231 Encounter for screening mammogram for malignant neoplasm of breast: Secondary | ICD-10-CM | POA: Insufficient documentation

## 2014-07-11 ENCOUNTER — Other Ambulatory Visit: Payer: Self-pay | Admitting: Internal Medicine

## 2014-07-11 DIAGNOSIS — R928 Other abnormal and inconclusive findings on diagnostic imaging of breast: Secondary | ICD-10-CM

## 2014-07-16 ENCOUNTER — Non-Acute Institutional Stay: Payer: Medicare Other | Admitting: Adult Health

## 2014-07-16 ENCOUNTER — Encounter: Payer: Self-pay | Admitting: Adult Health

## 2014-07-16 DIAGNOSIS — F32A Depression, unspecified: Secondary | ICD-10-CM

## 2014-07-16 DIAGNOSIS — F419 Anxiety disorder, unspecified: Secondary | ICD-10-CM

## 2014-07-16 DIAGNOSIS — I10 Essential (primary) hypertension: Secondary | ICD-10-CM | POA: Diagnosis not present

## 2014-07-16 DIAGNOSIS — F0391 Unspecified dementia with behavioral disturbance: Secondary | ICD-10-CM | POA: Diagnosis not present

## 2014-07-16 DIAGNOSIS — M47896 Other spondylosis, lumbar region: Secondary | ICD-10-CM

## 2014-07-16 DIAGNOSIS — IMO0002 Reserved for concepts with insufficient information to code with codable children: Secondary | ICD-10-CM

## 2014-07-16 DIAGNOSIS — F418 Other specified anxiety disorders: Secondary | ICD-10-CM | POA: Diagnosis not present

## 2014-07-16 DIAGNOSIS — F329 Major depressive disorder, single episode, unspecified: Secondary | ICD-10-CM

## 2014-07-16 DIAGNOSIS — E118 Type 2 diabetes mellitus with unspecified complications: Secondary | ICD-10-CM

## 2014-07-16 DIAGNOSIS — E1165 Type 2 diabetes mellitus with hyperglycemia: Secondary | ICD-10-CM

## 2014-07-16 DIAGNOSIS — F03918 Unspecified dementia, unspecified severity, with other behavioral disturbance: Secondary | ICD-10-CM

## 2014-07-16 DIAGNOSIS — E039 Hypothyroidism, unspecified: Secondary | ICD-10-CM | POA: Diagnosis not present

## 2014-07-16 DIAGNOSIS — E785 Hyperlipidemia, unspecified: Secondary | ICD-10-CM

## 2014-07-16 DIAGNOSIS — F482 Pseudobulbar affect: Secondary | ICD-10-CM

## 2014-07-18 ENCOUNTER — Ambulatory Visit
Admission: RE | Admit: 2014-07-18 | Discharge: 2014-07-18 | Disposition: A | Discharge status: Home / Self Care | Payer: Medicare Other | Source: Ambulatory Visit | Attending: Internal Medicine | Admitting: Internal Medicine

## 2014-07-18 DIAGNOSIS — R928 Other abnormal and inconclusive findings on diagnostic imaging of breast: Secondary | ICD-10-CM

## 2014-07-24 ENCOUNTER — Encounter: Payer: Self-pay | Admitting: Internal Medicine

## 2014-07-24 ENCOUNTER — Non-Acute Institutional Stay: Payer: Medicare Other | Admitting: Internal Medicine

## 2014-07-24 DIAGNOSIS — H1013 Acute atopic conjunctivitis, bilateral: Secondary | ICD-10-CM

## 2014-07-24 DIAGNOSIS — F0391 Unspecified dementia with behavioral disturbance: Secondary | ICD-10-CM

## 2014-07-24 DIAGNOSIS — J309 Allergic rhinitis, unspecified: Principal | ICD-10-CM

## 2014-07-24 DIAGNOSIS — J302 Other seasonal allergic rhinitis: Secondary | ICD-10-CM

## 2014-07-24 DIAGNOSIS — F03918 Unspecified dementia, unspecified severity, with other behavioral disturbance: Secondary | ICD-10-CM

## 2014-07-24 DIAGNOSIS — F482 Pseudobulbar affect: Secondary | ICD-10-CM | POA: Diagnosis not present

## 2014-07-24 DIAGNOSIS — E1165 Type 2 diabetes mellitus with hyperglycemia: Secondary | ICD-10-CM

## 2014-07-24 DIAGNOSIS — IMO0002 Reserved for concepts with insufficient information to code with codable children: Secondary | ICD-10-CM

## 2014-07-24 DIAGNOSIS — E118 Type 2 diabetes mellitus with unspecified complications: Secondary | ICD-10-CM

## 2014-07-24 NOTE — Progress Notes (Signed)
Patient ID: Alexis Reese, female   DOB: 1934-10-15, 80 y.o.   MRN: [id number]    [place] [facility] Service: SNF [phone])   07/24/14  No Known Allergies  Chief Complaint  Patient presents with  . Acute Visit    eye redness    HPI:  80 yo female long term resident seen today for eye redness noted by nursing. Pt c/o R>L eye redness with sinus pressure. Pt is a poor historian due to dementia. Hx obtained from nursing.  She has a hx seasonal allergy and zyrtec and flonase. Mood stable on seroquel, cymbalta, depakote, nuedexta and klonopin. She take tradjenta, levemir and novolog for DM. No low BS reactions.  Past Medical History  Diagnosis Date  . Cancer   . Hypertension   . Diabetes mellitus   . GERD (gastroesophageal reflux disease)   . Thyroid disease   . Anxiety    Past Surgical History  Procedure Laterality Date  . Mastectomy    . Cholecystectomy     History   Social History  . Marital Status: Widowed    Spouse Name: N/A  . Number of Children: N/A  . Years of Education: N/A   Social History Main Topics  . Smoking status: Never Smoker   . Smokeless tobacco: Never Used  . Alcohol Use: No  . Drug Use: No  . Sexual Activity: No   Other Topics Concern  . None   Social History Narrative    Medications: Patient's Medications  New Prescriptions   No medications on file  Previous Medications   ACETAMINOPHEN (TYLENOL) 325 MG TABLET    Take 650 mg by mouth every 6 (six) hours as needed for mild pain or fever.   ALBUTEROL (ACCUNEB) 0.63 MG/3ML NEBULIZER SOLUTION    Take 1 ampule by nebulization every 4 (four) hours as needed for wheezing.   AMLODIPINE (NORVASC) 2.5 MG TABLET    Take 1 tablet (2.5 mg total) by mouth daily.   ASPIRIN EC 81 MG TABLET    Take 81 mg by mouth daily.   ATORVASTATIN (LIPITOR) 10 MG TABLET    Take 10 mg by mouth daily.   BENAZEPRIL (LOTENSIN) 20 MG TABLET    Take 20 mg by mouth 2 (two) times daily.   CALCITONIN, SALMON, (MIACALCIN/FORTICAL) 200 UNIT/ACT NASAL SPRAY    Place 1 spray into the nose daily. Alternating sids of  Nose daily   CETIRIZINE (ZYRTEC) 10 MG TABLET    Take 10 mg by mouth daily.   CLONAZEPAM (KLONOPIN) 0.5 MG TABLET    Take one tablet by mouth every 8 hours for anxiety   CYANOCOBALAMIN (,VITAMIN B-12,) 1000 MCG/ML INJECTION    Inject 1,000 mcg into the skin every 30 (thirty) days.   DEXTROMETHORPHAN-QUINIDINE (NUEDEXTA) 20-10 MG CAPS    Take 1 tablet by mouth 2 (two) times daily.   DIVALPROEX (DEPAKOTE SPRINKLE) 125 MG CAPSULE    Take 125 mg by mouth 2 (two) times daily.   DULOXETINE (CYMBALTA) 20 MG CAPSULE    Take 40 mg by mouth daily.    FLUTICASONE (FLONASE) 50 MCG/ACT NASAL SPRAY    Place 1 spray into both nostrils daily.   INSULIN ASPART (NOVOLOG) 100 UNIT/ML INJECTION    Inject 5 Units into the skin 3 (three) times daily before meals. For cbg >=150   INSULIN DETEMIR (LEVEMIR) 100 UNIT/ML INJECTION    Inject 15 Units into the skin at bedtime.   LEVOTHYROXINE (  SYNTHROID, LEVOTHROID) 125 MCG TABLET    Take 88 mcg by mouth daily before breakfast.    LINAGLIPTIN (TRADJENTA) 5 MG TABS TABLET    Take 5 mg by mouth daily.   MELATONIN 3 MG CAPS    Take 1 capsule by mouth daily.   POLYETHYLENE GLYCOL (MIRALAX / GLYCOLAX) PACKET    Take 17 g by mouth daily as needed.   QUETIAPINE (SEROQUEL) 25 MG TABLET    Take 25 mg by mouth 2 (two) times daily.   QUETIAPINE (SEROQUEL) 50 MG TABLET    Take 150 mg by mouth at bedtime. 1 tablet in morning, half tablet in afternoon and 3 tablet at night   SENNOSIDES-DOCUSATE SODIUM (SENOKOT-S) 8.6-50 MG TABLET    Take 2 tablets by mouth daily.   TRAMADOL (ULTRAM) 50 MG TABLET    Take one tablet by mouth every 8 hours as needed for pain  Modified Medications   No medications on file  Discontinued Medications   No medications on file     Review of Systems  Unable to perform ROS: Dementia    Filed Vitals:   07/24/14 1551  BP: 132/96    Pulse: 90  Temp: 97.7 F (36.5 C)  Weight: 177 lb (80.287 kg)   Body mass index is 32.37 kg/(m^2).  Physical Exam  Constitutional: She appears well-developed. No distress.  Frail appearing in NAD  HENT:  Mouth/Throat: No oropharyngeal exudate.  (+)maxillary sinus boggy tissue texture changes; oropharynx clear without exudate  Eyes: Pupils are equal, round, and reactive to light. Right eye exhibits discharge (clear). Left eye exhibits discharge (clear).  Corneal redness R>L with conjunctival cobblestoning. No purulent d/c. No orbital TTP. No swelling  Neck: Neck supple.  Neurological: She is alert.  Skin: Skin is warm and dry. No rash noted.  Psychiatric: She has a normal mood and affect. Her behavior is normal.     Labs reviewed: Orders Only on 05/02/2014  Component Date Value Ref Range Status  . TSH 05/02/2014 0.320* 0.350 - 4.500 uIU/mL Final  . T4, Free Direct Dialysis 05/02/2014 2.2  0.8 - 2.7 ng/dL Final   Comment:   Reference Ranges for Free T4 by Direct Dialysis:      Prematures, 25-30 Weeks,     Birth-7 Days            0.5-3.3 ng/dL    Premature, 31-36 Weeks,     Birth-7 Days            1.3-4.7 ng/dL    Cord Blood, >37 Weeks    1.2-2.2 ng/dL    Birth-4 Days             2.2-5.3 ng/dL    2 Weeks-20 Years         0.8-2.0 ng/dL    21-87 Years              0.8-2.7 ng/dL    Pregnancy:     First Trimester         0.7-2.0 ng/dL     Second Trimester        0.5-1.6 ng/dL     Third Trimester         0.5-1.6 ng/dL     Nursing Home on 05/01/2014  Component Date Value Ref Range Status  . Hgb A1c MFr Bld 03/19/2014 6.6* 4.0 - 6.0 % Final  . TSH 11/03/2013 0.40* .41 - 5.90 uIU/mL Final     Assessment/Plan    ICD-9-CM ICD-10-CM   1. Allergic  conjunctivitis and rhinitis, bilateral 372.05 H10.13    477.9    2. Dementia with behavioral disturbance 294.21 F03.91   3. PBA (pseudobulbar affect) 310.81 F48.2   4. Type II diabetes mellitus with complication, uncontrolled  250.92 E11.8   5.      Other seasonal allergic rhinitis  --Rx patanol eye gtts, 1 gtt in OU qPM x 14 days then may use prn  --cont other meds as ordered  --will follow  Mashayla Lavin S. Perlie Gold  [facility] [address] [place], [place] [address] [phone] Office (Wednesdays and Fridays 8 AM - 5 PM) [phone] Cell (Monday-Friday 8 AM - 5 PM)

## 2014-08-20 ENCOUNTER — Encounter: Payer: Self-pay | Admitting: Adult Health

## 2014-08-20 ENCOUNTER — Non-Acute Institutional Stay: Payer: Medicare Other | Admitting: Adult Health

## 2014-08-20 DIAGNOSIS — F418 Other specified anxiety disorders: Secondary | ICD-10-CM

## 2014-08-20 DIAGNOSIS — E1165 Type 2 diabetes mellitus with hyperglycemia: Secondary | ICD-10-CM

## 2014-08-20 DIAGNOSIS — K5900 Constipation, unspecified: Secondary | ICD-10-CM | POA: Diagnosis not present

## 2014-08-20 DIAGNOSIS — F482 Pseudobulbar affect: Secondary | ICD-10-CM | POA: Diagnosis not present

## 2014-08-20 DIAGNOSIS — E118 Type 2 diabetes mellitus with unspecified complications: Secondary | ICD-10-CM

## 2014-08-20 DIAGNOSIS — E785 Hyperlipidemia, unspecified: Secondary | ICD-10-CM | POA: Diagnosis not present

## 2014-08-20 DIAGNOSIS — I10 Essential (primary) hypertension: Secondary | ICD-10-CM | POA: Diagnosis not present

## 2014-08-20 DIAGNOSIS — E039 Hypothyroidism, unspecified: Secondary | ICD-10-CM | POA: Diagnosis not present

## 2014-08-20 DIAGNOSIS — F0391 Unspecified dementia with behavioral disturbance: Secondary | ICD-10-CM | POA: Diagnosis not present

## 2014-08-20 DIAGNOSIS — F03918 Unspecified dementia, unspecified severity, with other behavioral disturbance: Secondary | ICD-10-CM

## 2014-08-20 DIAGNOSIS — IMO0002 Reserved for concepts with insufficient information to code with codable children: Secondary | ICD-10-CM

## 2014-09-24 NOTE — Progress Notes (Signed)
This encounter was created in error - please disregard.

## 2014-09-24 NOTE — Progress Notes (Signed)
Patient ID: Alexis Reese, female   DOB: 10-13-1934, 80 y.o.   MRN: [id number]  [name] living [place]     No Known Allergies     Chief Complaint  Patient presents with  . Medical Management of Chronic Issues    HPI:  She is a long term resident of this facility being seen for the management of her chronic illnesses. She is complaining of left shoulder pain. Her x-ray was negative for fracture; is willing to have therapy seen her.   Past Medical History  Diagnosis Date  . Cancer   . Hypertension   . Diabetes mellitus   . GERD (gastroesophageal reflux disease)   . Thyroid disease   . Anxiety     Past Surgical History  Procedure Laterality Date  . Mastectomy    . Cholecystectomy      VITAL SIGNS BP 110/68 mmHg  Pulse 92  Ht 5\' 2"  (1.575 m)  Wt 177 lb (80.287 kg)  BMI 32.37 kg/m2   Outpatient Encounter Prescriptions as of 07/16/2014  Medication Sig  . acetaminophen (TYLENOL) 325 MG tablet Take 650 mg by mouth every 6 (six) hours as needed for mild pain or fever.  Marland Kitchen albuterol (ACCUNEB) 0.63 MG/3ML nebulizer solution Take 1 ampule by nebulization every 4 (four) hours as needed for wheezing.  Marland Kitchen amLODipine (NORVASC) 2.5 MG tablet Take 1 tablet (2.5 mg total) by mouth daily.  Marland Kitchen aspirin EC 81 MG tablet Take 81 mg by mouth daily.  Marland Kitchen atorvastatin (LIPITOR) 10 MG tablet Take 10 mg by mouth daily.  . benazepril (LOTENSIN) 20 MG tablet Take 20 mg by mouth 2 (two) times daily.  . calcitonin, salmon, (MIACALCIN/FORTICAL) 200 UNIT/ACT nasal spray Place 1 spray into the nose daily. Alternating sids of  Nose daily  . cetirizine (ZYRTEC) 10 MG tablet Take 10 mg by mouth daily.  . clonazePAM (KLONOPIN) 0.5 MG tablet Take one tablet by mouth every 8 hours for anxiety  . cyanocobalamin (,VITAMIN B-12,) 1000 MCG/ML injection Inject 1,000 mcg into the skin every 30 (thirty) days.  . Dextromethorphan-Quinidine (NUEDEXTA) 20-10 MG CAPS Take 1 tablet by mouth 2 (two) times daily.  .  divalproex (DEPAKOTE SPRINKLE) 125 MG capsule Take 125 mg by mouth 2 (two) times daily.  . DULoxetine (CYMBALTA) 20 MG capsule Take 40 mg by mouth daily.   . fluticasone (FLONASE) 50 MCG/ACT nasal spray Place 1 spray into both nostrils daily.  . insulin aspart (NOVOLOG) 100 UNIT/ML injection Inject 5 Units into the skin 3 (three) times daily before meals. For cbg >=150  . insulin detemir (LEVEMIR) 100 UNIT/ML injection Inject 15 Units into the skin at bedtime.  Marland Kitchen levothyroxine (SYNTHROID, LEVOTHROID) 125 MCG tablet Take 75 mcg by mouth daily before breakfast.   . linagliptin (TRADJENTA) 5 MG TABS tablet Take 5 mg by mouth daily.  . Melatonin 3 MG CAPS Take 1 capsule by mouth daily.  . polyethylene glycol (MIRALAX / GLYCOLAX) packet Take 17 g by mouth daily as needed.  Marland Kitchen QUEtiapine (SEROQUEL) 25 MG tablet Take 25 mg by mouth 2 (two) times daily.  . QUEtiapine (SEROQUEL) 50 MG tablet Take 150 mg by mouth at bedtime.  . sennosides-docusate sodium (SENOKOT-S) 8.6-50 MG tablet Take 2 tablets by mouth daily.  . traMADol (ULTRAM) 50 MG tablet Take one tablet by mouth every 8 hours as needed for pain     SIGNIFICANT DIAGNOSTIC EXAMS  02-12-14: right foot x-ray: no acute abnormality  3--29-16: left shoulder x-ray: mild DJD  no fracture or dislocation   LABS REVIEWED:   09-19-13: wbc 4.0; hgb 14.3; hct 42.6; mcv 92.8; plt 147; glucose 337; bun 10; creat 0.92; k+3.8; na++139; liver normal albumin 3.5; chol 128; ldl 37; trig 170; tsh 0.030; hgb a1c 12.1; vit d 74 09-18-13: urine micro-albumin 2.44 10-16-13: urine culture: e-coli: rocephin 11-03-13: tsh 0.457 06-13-14: wbc 4.3; hgb 12.8; hct 38.7; mcv 92.1; plt 166; glucose 129; bun 12; creat 0.89; k+4.2; na++139; liver normal albumin 3.6; chol 138 ;ldl 41; trig 176; hdl 62  06-18-14: tsh 0.025; free t4: 2.5     ROS Constitutional: Negative for malaise/fatigue.  Respiratory: Negative for cough and shortness of breath.   Cardiovascular: Negative for  chest pain and palpitations.  Gastrointestinal: Negative for heartburn, abdominal pain and constipation.  Musculoskeletal: Negative for myalgias has left shoulder pain   Skin: Negative.   Neurological: Negative for headaches.  Psychiatric/Behavioral: The patient is not nervous/anxious.       Physical Exam Constitutional: She appears well-developed and well-nourished. No distress.  Neck: Neck supple. No JVD present. No thyromegaly present.  Cardiovascular: Normal rate, regular rhythm and intact distal pulses.   Respiratory: Effort normal and breath sounds normal. No respiratory distress.  GI: Soft. Bowel sounds are normal. She exhibits no distension. There is no tenderness.  Musculoskeletal: She exhibits no edema.  Is able to move all extremities   Neurological: She is alert.  Skin: Skin is warm and dry. She is not diaphoretic.  Psychiatric: is calm         ASSESSMENT/ PLAN:   1. Diabetes: will continue levemir 15 units daily; novolog 5 units prior to meals for cbg >=150; and tradjenta 5 mg daily. Is taking an ace inhibitor; statin and asa. Will monitor   2. Hypertension: is not being adequately controlled. Will continue lisinopril 20 mg twice daily; asa 81 mg daily will begin norvasc 2.5 mg daily; t. Will monitor   3. Dyslipidemia: will continue lipitor 10 mg daily; ldl is 41  4. Depression with anxiety: she is emotionally without change and is taking depakote sprinkles 125 mg twice daily to help stabilize mood; klonopin 0.5 mg every 8 hours for anxiety; will continue  cymbalta  40 mg daily and will continue seroquel 25 mg twice daily  and 150 mg nightly will monitor   5. Hypothyroidism: will continue synthroid 88 mcg daily; tsh is 0.457  6. PBA: will continue nuedexta 20/10 twice daily   7. Allergic rhinitis: will continue zyrtec 10 mg daily    8. Osteoporosis: will continue calcitonin 1 puff daily  9. Insomnia: will continue melatonin 3 mg nightly  10. Constipation:  will continue senna s 2 tabs nightly and miralax daily as needed  11. Dementia: no change in status; is presently not on medications; will not make changes will monitor    Will have therapy see her for left shoulder pain   Ok Edwards NP [facility]  Contact [phone] Monday through Friday 8am- 5pm  After hours call [phone]

## 2014-10-08 ENCOUNTER — Encounter: Payer: Self-pay | Admitting: Adult Health

## 2014-10-08 NOTE — Progress Notes (Signed)
This encounter was created in error - please disregard.

## 2014-10-08 NOTE — Progress Notes (Signed)
Patient ID: Alexis Reese, female   DOB: [birth year], 80 y.o.   MRN: [id number]  Alexis Reese living [place]     No Known Allergies     Chief Complaint  Patient presents with  . Medical Management of Chronic Issues    HPI:  She is a long term resident of this facility being seen for the management of her chronic illnesses. She is complaining of sinus congestion present. There is no sinus drainage no cough no sore throat present. There are no nursing concerns at this time.    Past Medical History  Diagnosis Date  . Cancer   . Hypertension   . Diabetes mellitus   . GERD (gastroesophageal reflux disease)   . Thyroid disease   . Anxiety     Past Surgical History  Procedure Laterality Date  . Mastectomy    . Cholecystectomy      VITAL SIGNS BP 125/84 mmHg  Pulse 78  Ht 5\' 2"  (1.575 m)  Wt 178 lb (80.74 kg)  BMI 32.55 kg/m2   Outpatient Encounter Prescriptions as of 08/20/2014  Medication Sig  . acetaminophen (TYLENOL) 325 MG tablet Take 650 mg by mouth every 6 (six) hours as needed for mild pain or fever.  Marland Kitchen albuterol (ACCUNEB) 0.63 MG/3ML nebulizer solution Take 1 ampule by nebulization every 4 (four) hours as needed for wheezing.  Marland Kitchen amLODipine (NORVASC) 2.5 MG tablet Take 1 tablet (2.5 mg total) by mouth daily.  Marland Kitchen aspirin EC 81 MG tablet Take 81 mg by mouth daily.  Marland Kitchen atorvastatin (LIPITOR) 10 MG tablet Take 10 mg by mouth daily.  . benazepril (LOTENSIN) 20 MG tablet Take 20 mg by mouth 2 (two) times daily.  . calcitonin, salmon, (MIACALCIN/FORTICAL) 200 UNIT/ACT nasal spray Place 1 spray into the nose daily. Alternating sids of  Nose daily  . cetirizine (ZYRTEC) 10 MG tablet Take 10 mg by mouth daily.  . clonazePAM (KLONOPIN) 0.5 MG tablet Take one tablet by mouth every 8 hours for anxiety  . cyanocobalamin (,VITAMIN B-12,) 1000 MCG/ML injection Inject 1,000 mcg into the skin every 30 (thirty) days.  . Dextromethorphan-Quinidine (NUEDEXTA) 20-10 MG CAPS Take 1 tablet  by mouth 2 (two) times daily.  . divalproex (DEPAKOTE SPRINKLE) 125 MG capsule Take 125 mg by mouth 2 (two) times daily.  . DULoxetine (CYMBALTA) 20 MG capsule Take 40 mg by mouth daily.   . insulin aspart (NOVOLOG) 100 UNIT/ML injection Inject 5 Units into the skin 3 (three) times daily before meals. For cbg >=150  . insulin detemir (LEVEMIR) 100 UNIT/ML injection Inject 15 Units into the skin at bedtime.  Marland Kitchen levothyroxine (SYNTHROID, LEVOTHROID) 125 MCG tablet Take 75 mcg by mouth daily before breakfast.   . linagliptin (TRADJENTA) 5 MG TABS tablet Take 5 mg by mouth daily.  . Melatonin 3 MG CAPS Take 1 capsule by mouth daily.  . QUEtiapine (SEROQUEL) 25 MG tablet Take 25 mg by mouth 2 (two) times daily.  . QUEtiapine (SEROQUEL) 50 MG tablet Take 150 mg by mouth at bedtime.   . sennosides-docusate sodium (SENOKOT-S) 8.6-50 MG tablet Take 2 tablets by mouth daily.  . traMADol (ULTRAM) 50 MG tablet Take one tablet by mouth every 8 hours as needed for pain    SIGNIFICANT DIAGNOSTIC EXAMS  02-12-14: right foot x-ray: no acute abnormality  3--29-16: left shoulder x-ray: mild DJD no fracture or dislocation   LABS REVIEWED:   09-19-13: wbc 4.0; hgb 14.3; hct 42.6; mcv 92.8; plt 147; glucose 337; bun  10; creat 0.92; k+3.8; na++139; liver normal albumin 3.5; chol 128; ldl 37; trig 170; tsh 0.030; hgb a1c 12.1; vit d 74 09-18-13: urine micro-albumin 2.44 10-16-13: urine culture: e-coli: rocephin 11-03-13: tsh 0.457 06-13-14: wbc 4.3; hgb 12.8; hct 38.7; mcv 92.1; plt 166; glucose 129; bun 12; creat 0.89; k+4.2; na++139; liver normal albumin 3.6; chol 138 ;ldl 41; trig 176; hdl 62  06-18-14: tsh 0.025; free t4: 2.5 08-03-14: tsh 0.862     ROS Constitutional: Negative for malaise/fatigue.  Respiratory: Negative for cough and shortness of breath.   Cardiovascular: Negative for chest pain and palpitations.  Gastrointestinal: Negative for heartburn, abdominal pain and constipation.  Musculoskeletal:  Negative for myalgias no joint pain    Skin: Negative.   Neurological: Negative for headaches.  Psychiatric/Behavioral: The patient is not nervous/anxious.      Physical Exam Constitutional: She appears well-developed and well-nourished. No distress.  Neck: Neck supple. No JVD present. No thyromegaly present.  Cardiovascular: Normal rate, regular rhythm and intact distal pulses.   Respiratory: Effort normal and breath sounds normal. No respiratory distress.  GI: Soft. Bowel sounds are normal. She exhibits no distension. There is no tenderness.  Musculoskeletal: She exhibits no edema.  Is able to move all extremities   Neurological: She is alert.  Skin: Skin is warm and dry. She is not diaphoretic.  Psychiatric: is calm         ASSESSMENT/ PLAN:  1. Diabetes: will continue levemir 15 units daily; novolog 5 units prior to meals for cbg >=150; and tradjenta 5 mg daily. Is taking an ace inhibitor; statin and asa. Will monitor   2. Hypertension: is not being adequately controlled. Will continue lisinopril 20 mg twice daily; asa 81 mg daily will begin norvasc 2.5 mg daily; t. Will monitor   3. Dyslipidemia: will continue lipitor 10 mg daily; ldl is 41  4. Depression with anxiety: she is emotionally without change and is taking depakote sprinkles 125 mg twice daily to help stabilize mood; klonopin 0.5 mg every 8 hours for anxiety; will continue  cymbalta  40 mg daily and will continue seroquel 25 mg twice daily  and 150 mg nightly will monitor   5. Hypothyroidism: will continue synthroid 75 mcg daily; tsh is 0.862  6. PBA: will continue nuedexta 20/10 twice daily   7. Allergic rhinitis: will continue zyrtec 10 mg daily  Will begin flonase daily and will monitor   8. Osteoporosis: will continue calcitonin 1 puff daily  9. Insomnia: will continue melatonin 3 mg nightly  10. Constipation: will continue senna s 2 tabs nightly   11. Dementia: no change in status; is presently not on  medications; will not make changes will monitor  12. Dementia: without change in status; is presently not taking medications; will not make changes.     Alexis Edwards NP [facility]  Contact [phone] Monday through Friday 8am- 5pm  After hours call [phone]

## 2015-02-18 ENCOUNTER — Other Ambulatory Visit: Payer: Self-pay | Admitting: Internal Medicine

## 2015-02-18 DIAGNOSIS — N6320 Unspecified lump in the left breast, unspecified quadrant: Secondary | ICD-10-CM

## 2015-02-27 ENCOUNTER — Ambulatory Visit
Admission: RE | Admit: 2015-02-27 | Discharge: 2015-02-27 | Disposition: A | Discharge status: Home / Self Care | Payer: Medicare Other | Source: Ambulatory Visit | Attending: Internal Medicine | Admitting: Internal Medicine

## 2015-02-27 DIAGNOSIS — N6320 Unspecified lump in the left breast, unspecified quadrant: Secondary | ICD-10-CM

## 2015-06-07 ENCOUNTER — Other Ambulatory Visit (HOSPITAL_COMMUNITY): Payer: Self-pay | Admitting: Internal Medicine

## 2015-06-07 DIAGNOSIS — R1084 Generalized abdominal pain: Secondary | ICD-10-CM

## 2015-06-12 ENCOUNTER — Ambulatory Visit (HOSPITAL_COMMUNITY)
Admission: RE | Admit: 2015-06-12 | Discharge: 2015-06-12 | Disposition: A | Discharge status: Home / Self Care | Payer: Medicare Other | Source: Ambulatory Visit | Attending: Internal Medicine | Admitting: Internal Medicine

## 2015-06-12 DIAGNOSIS — R1084 Generalized abdominal pain: Secondary | ICD-10-CM

## 2015-06-12 DIAGNOSIS — I517 Cardiomegaly: Secondary | ICD-10-CM | POA: Insufficient documentation

## 2015-06-12 DIAGNOSIS — K862 Cyst of pancreas: Secondary | ICD-10-CM | POA: Insufficient documentation

## 2015-06-12 DIAGNOSIS — K5730 Diverticulosis of large intestine without perforation or abscess without bleeding: Secondary | ICD-10-CM | POA: Diagnosis not present

## 2015-06-12 DIAGNOSIS — M4317 Spondylolisthesis, lumbosacral region: Secondary | ICD-10-CM | POA: Insufficient documentation

## 2015-06-12 LAB — POCT I-STAT CREATININE: Creatinine, Ser: 0.9 mg/dL (ref 0.44–1.00)

## 2015-06-12 MED ORDER — IOHEXOL 300 MG/ML  SOLN
100.0000 mL | Freq: Once | INTRAMUSCULAR | Status: AC | PRN
  Administered 2015-06-12: 100 mL via INTRAVENOUS

## 2016-06-24 ENCOUNTER — Other Ambulatory Visit (HOSPITAL_COMMUNITY): Payer: Self-pay | Admitting: Nurse Practitioner

## 2016-06-24 DIAGNOSIS — K869 Disease of pancreas, unspecified: Secondary | ICD-10-CM

## 2016-07-02 ENCOUNTER — Ambulatory Visit (HOSPITAL_COMMUNITY)
Admission: RE | Admit: 2016-07-02 | Discharge: 2016-07-02 | Disposition: A | Discharge status: Home / Self Care | Payer: Medicare Other | Source: Ambulatory Visit | Attending: Nurse Practitioner | Admitting: Nurse Practitioner

## 2016-07-02 ENCOUNTER — Other Ambulatory Visit (HOSPITAL_COMMUNITY): Payer: Self-pay | Admitting: Nurse Practitioner

## 2016-07-02 DIAGNOSIS — K838 Other specified diseases of biliary tract: Secondary | ICD-10-CM | POA: Diagnosis not present

## 2016-07-02 DIAGNOSIS — K862 Cyst of pancreas: Secondary | ICD-10-CM | POA: Diagnosis not present

## 2016-07-02 DIAGNOSIS — K869 Disease of pancreas, unspecified: Secondary | ICD-10-CM | POA: Diagnosis present

## 2016-07-02 DIAGNOSIS — J90 Pleural effusion, not elsewhere classified: Secondary | ICD-10-CM | POA: Insufficient documentation

## 2016-07-02 MED ORDER — GADOBENATE DIMEGLUMINE 529 MG/ML IV SOLN
15.0000 mL | Freq: Once | INTRAVENOUS | Status: AC | PRN
  Administered 2016-07-02: 15 mL via INTRAVENOUS

## 2017-02-11 ENCOUNTER — Emergency Department (HOSPITAL_COMMUNITY): Payer: Medicare Other

## 2017-02-11 ENCOUNTER — Inpatient Hospital Stay (HOSPITAL_COMMUNITY)
Admission: EM | Admit: 2017-02-11 | Discharge: 2017-02-16 | DRG: 492 | Disposition: A | Discharge status: Skilled Nursing Facility | Payer: Medicare Other | Attending: Internal Medicine | Admitting: Internal Medicine

## 2017-02-11 ENCOUNTER — Inpatient Hospital Stay (HOSPITAL_COMMUNITY): Payer: Medicare Other

## 2017-02-11 ENCOUNTER — Encounter (HOSPITAL_COMMUNITY): Payer: Self-pay | Admitting: Emergency Medicine

## 2017-02-11 ENCOUNTER — Emergency Department (HOSPITAL_COMMUNITY): Admission: EM | Admit: 2017-02-11 | Discharge: 2017-02-11 | Discharge status: Absent without leave | Payer: Self-pay

## 2017-02-11 ENCOUNTER — Other Ambulatory Visit: Payer: Self-pay

## 2017-02-11 DIAGNOSIS — S82851K Displaced trimalleolar fracture of right lower leg, subsequent encounter for closed fracture with nonunion: Secondary | ICD-10-CM | POA: Diagnosis not present

## 2017-02-11 DIAGNOSIS — Z66 Do not resuscitate: Secondary | ICD-10-CM | POA: Diagnosis present

## 2017-02-11 DIAGNOSIS — J810 Acute pulmonary edema: Secondary | ICD-10-CM | POA: Diagnosis not present

## 2017-02-11 DIAGNOSIS — J811 Chronic pulmonary edema: Secondary | ICD-10-CM

## 2017-02-11 DIAGNOSIS — S82891A Other fracture of right lower leg, initial encounter for closed fracture: Secondary | ICD-10-CM | POA: Diagnosis not present

## 2017-02-11 DIAGNOSIS — I10 Essential (primary) hypertension: Secondary | ICD-10-CM | POA: Diagnosis present

## 2017-02-11 DIAGNOSIS — J9601 Acute respiratory failure with hypoxia: Secondary | ICD-10-CM | POA: Diagnosis not present

## 2017-02-11 DIAGNOSIS — E869 Volume depletion, unspecified: Secondary | ICD-10-CM | POA: Diagnosis present

## 2017-02-11 DIAGNOSIS — E039 Hypothyroidism, unspecified: Secondary | ICD-10-CM | POA: Diagnosis present

## 2017-02-11 DIAGNOSIS — S82899A Other fracture of unspecified lower leg, initial encounter for closed fracture: Secondary | ICD-10-CM | POA: Diagnosis present

## 2017-02-11 DIAGNOSIS — K219 Gastro-esophageal reflux disease without esophagitis: Secondary | ICD-10-CM | POA: Diagnosis present

## 2017-02-11 DIAGNOSIS — R451 Restlessness and agitation: Secondary | ICD-10-CM | POA: Diagnosis not present

## 2017-02-11 DIAGNOSIS — G9341 Metabolic encephalopathy: Secondary | ICD-10-CM | POA: Diagnosis not present

## 2017-02-11 DIAGNOSIS — S9301XA Subluxation of right ankle joint, initial encounter: Secondary | ICD-10-CM | POA: Diagnosis present

## 2017-02-11 DIAGNOSIS — D696 Thrombocytopenia, unspecified: Secondary | ICD-10-CM | POA: Diagnosis present

## 2017-02-11 DIAGNOSIS — Y92129 Unspecified place in nursing home as the place of occurrence of the external cause: Secondary | ICD-10-CM

## 2017-02-11 DIAGNOSIS — F329 Major depressive disorder, single episode, unspecified: Secondary | ICD-10-CM | POA: Diagnosis present

## 2017-02-11 DIAGNOSIS — E785 Hyperlipidemia, unspecified: Secondary | ICD-10-CM | POA: Diagnosis present

## 2017-02-11 DIAGNOSIS — S82851A Displaced trimalleolar fracture of right lower leg, initial encounter for closed fracture: Secondary | ICD-10-CM | POA: Diagnosis present

## 2017-02-11 DIAGNOSIS — F419 Anxiety disorder, unspecified: Secondary | ICD-10-CM | POA: Diagnosis present

## 2017-02-11 DIAGNOSIS — F05 Delirium due to known physiological condition: Secondary | ICD-10-CM | POA: Diagnosis not present

## 2017-02-11 DIAGNOSIS — Z8673 Personal history of transient ischemic attack (TIA), and cerebral infarction without residual deficits: Secondary | ICD-10-CM | POA: Diagnosis not present

## 2017-02-11 DIAGNOSIS — Z7982 Long term (current) use of aspirin: Secondary | ICD-10-CM | POA: Diagnosis not present

## 2017-02-11 DIAGNOSIS — R0902 Hypoxemia: Secondary | ICD-10-CM

## 2017-02-11 DIAGNOSIS — Z794 Long term (current) use of insulin: Secondary | ICD-10-CM

## 2017-02-11 DIAGNOSIS — W19XXXA Unspecified fall, initial encounter: Secondary | ICD-10-CM

## 2017-02-11 DIAGNOSIS — F0391 Unspecified dementia with behavioral disturbance: Secondary | ICD-10-CM | POA: Diagnosis present

## 2017-02-11 DIAGNOSIS — W1830XA Fall on same level, unspecified, initial encounter: Secondary | ICD-10-CM | POA: Diagnosis present

## 2017-02-11 DIAGNOSIS — E119 Type 2 diabetes mellitus without complications: Secondary | ICD-10-CM | POA: Diagnosis present

## 2017-02-11 DIAGNOSIS — T148XXA Other injury of unspecified body region, initial encounter: Secondary | ICD-10-CM

## 2017-02-11 DIAGNOSIS — T402X5A Adverse effect of other opioids, initial encounter: Secondary | ICD-10-CM | POA: Diagnosis not present

## 2017-02-11 DIAGNOSIS — M25571 Pain in right ankle and joints of right foot: Secondary | ICD-10-CM | POA: Diagnosis present

## 2017-02-11 DIAGNOSIS — Z01810 Encounter for preprocedural cardiovascular examination: Secondary | ICD-10-CM

## 2017-02-11 DIAGNOSIS — I5030 Unspecified diastolic (congestive) heart failure: Secondary | ICD-10-CM | POA: Diagnosis not present

## 2017-02-11 DIAGNOSIS — T424X5A Adverse effect of benzodiazepines, initial encounter: Secondary | ICD-10-CM | POA: Diagnosis not present

## 2017-02-11 DIAGNOSIS — F03918 Unspecified dementia, unspecified severity, with other behavioral disturbance: Secondary | ICD-10-CM | POA: Diagnosis present

## 2017-02-11 DIAGNOSIS — Z79899 Other long term (current) drug therapy: Secondary | ICD-10-CM | POA: Diagnosis not present

## 2017-02-11 DIAGNOSIS — E038 Other specified hypothyroidism: Secondary | ICD-10-CM | POA: Diagnosis not present

## 2017-02-11 DIAGNOSIS — S82851G Displaced trimalleolar fracture of right lower leg, subsequent encounter for closed fracture with delayed healing: Secondary | ICD-10-CM | POA: Diagnosis not present

## 2017-02-11 HISTORY — DX: Cerebral infarction, unspecified: I63.9

## 2017-02-11 LAB — CBC WITH DIFFERENTIAL/PLATELET
Basophils Absolute: 0 10*3/uL (ref 0.0–0.1)
Basophils Relative: 0 %
EOS PCT: 1 %
Eosinophils Absolute: 0.1 10*3/uL (ref 0.0–0.7)
HCT: 42.2 % (ref 36.0–46.0)
HEMOGLOBIN: 14 g/dL (ref 12.0–15.0)
Lymphocytes Relative: 12 %
Lymphs Abs: 1.1 10*3/uL (ref 0.7–4.0)
MCH: 31.3 pg (ref 26.0–34.0)
MCHC: 33.2 g/dL (ref 30.0–36.0)
MCV: 94.2 fL (ref 78.0–100.0)
MONOS PCT: 5 %
Monocytes Absolute: 0.5 10*3/uL (ref 0.1–1.0)
Neutro Abs: 7.8 10*3/uL — ABNORMAL HIGH (ref 1.7–7.7)
Neutrophils Relative %: 82 %
Platelets: 141 10*3/uL — ABNORMAL LOW (ref 150–400)
RBC: 4.48 MIL/uL (ref 3.87–5.11)
RDW: 12.4 % (ref 11.5–15.5)
WBC: 9.5 10*3/uL (ref 4.0–10.5)

## 2017-02-11 LAB — BASIC METABOLIC PANEL
Anion gap: 10 (ref 5–15)
BUN: 20 mg/dL (ref 6–20)
CALCIUM: 9.5 mg/dL (ref 8.9–10.3)
CHLORIDE: 97 mmol/L — AB (ref 101–111)
CO2: 31 mmol/L (ref 22–32)
Creatinine, Ser: 0.91 mg/dL (ref 0.44–1.00)
GFR calc Af Amer: >60 mL/min (ref >60)
GFR calc non Af Amer: 58 mL/min — ABNORMAL LOW (ref >60)
Glucose, Bld: 249 mg/dL — ABNORMAL HIGH (ref 65–99)
Potassium: 4.6 mmol/L (ref 3.5–5.1)
SODIUM: 138 mmol/L (ref 135–145)

## 2017-02-11 MED ORDER — HYDROMORPHONE HCL 1 MG/ML IJ SOLN
1.0000 mg | INTRAMUSCULAR | Status: DC | PRN
  Administered 2017-02-12 (×3): 1 mg via INTRAVENOUS
  Filled 2017-02-11 (×3): qty 1

## 2017-02-11 MED ORDER — LORATADINE 10 MG PO TABS: 10.0000 mg | ORAL_TABLET | Freq: Every day | ORAL | Status: DC

## 2017-02-11 MED ORDER — CLONAZEPAM 0.5 MG PO TABS
0.5000 mg | ORAL_TABLET | Freq: Three times a day (TID) | ORAL | Status: DC | PRN
  Administered 2017-02-12 – 2017-02-14 (×5): 0.5 mg via ORAL
  Filled 2017-02-11 (×6): qty 1

## 2017-02-11 MED ORDER — ATORVASTATIN CALCIUM 10 MG PO TABS
10.0000 mg | ORAL_TABLET | Freq: Every day | ORAL | Status: DC
  Administered 2017-02-13 – 2017-02-14 (×2): 10 mg via ORAL
  Filled 2017-02-11 (×4): qty 1

## 2017-02-11 MED ORDER — AMLODIPINE BESYLATE 5 MG PO TABS: 2.5000 mg | ORAL_TABLET | Freq: Every day | ORAL | Status: DC

## 2017-02-11 MED ORDER — ONDANSETRON HCL 4 MG/2ML IJ SOLN: 4.0000 mg | Freq: Four times a day (QID) | INTRAMUSCULAR | Status: DC | PRN

## 2017-02-11 MED ORDER — SENNOSIDES-DOCUSATE SODIUM 8.6-50 MG PO TABS
2.0000 | ORAL_TABLET | Freq: Two times a day (BID) | ORAL | Status: DC
Start: 2017-02-11 — End: 2017-02-16
  Administered 2017-02-12 – 2017-02-15 (×6): 2 via ORAL
  Filled 2017-02-11 (×17): qty 2

## 2017-02-11 MED ORDER — DIPHENHYDRAMINE HCL 50 MG/ML IJ SOLN: 25.0000 mg | Freq: Four times a day (QID) | INTRAMUSCULAR | Status: DC | PRN

## 2017-02-11 MED ORDER — MIRABEGRON ER 25 MG PO TB24
25.0000 mg | ORAL_TABLET | Freq: Every day | ORAL | Status: DC
  Administered 2017-02-13 – 2017-02-16 (×3): 25 mg via ORAL
  Filled 2017-02-11 (×4): qty 1

## 2017-02-11 MED ORDER — LINAGLIPTIN 5 MG PO TABS: 5.0000 mg | ORAL_TABLET | Freq: Every day | ORAL | Status: DC

## 2017-02-11 MED ORDER — ASPIRIN EC 81 MG PO TBEC
81.0000 mg | DELAYED_RELEASE_TABLET | Freq: Every day | ORAL | Status: DC
  Administered 2017-02-13 – 2017-02-14 (×2): 81 mg via ORAL
  Filled 2017-02-11 (×4): qty 1

## 2017-02-11 MED ORDER — CEFAZOLIN SODIUM-DEXTROSE 2-4 GM/100ML-% IV SOLN
2.0000 g | INTRAVENOUS | Status: AC
  Administered 2017-02-12: 2 g via INTRAVENOUS
  Filled 2017-02-11 (×2): qty 100

## 2017-02-11 MED ORDER — LEVOTHYROXINE SODIUM 75 MCG PO TABS
75.0000 ug | ORAL_TABLET | Freq: Every day | ORAL | Status: DC
  Administered 2017-02-13 – 2017-02-16 (×3): 75 ug via ORAL
  Filled 2017-02-11 (×4): qty 1

## 2017-02-11 MED ORDER — FENTANYL CITRATE (PF) 100 MCG/2ML IJ SOLN
25.0000 ug | Freq: Once | INTRAMUSCULAR | Status: AC
  Administered 2017-02-11: 25 ug via INTRAVENOUS
  Filled 2017-02-11: qty 2

## 2017-02-11 MED ORDER — VITAMIN D (ERGOCALCIFEROL) 1.25 MG (50000 UNIT) PO CAPS
50000.0000 [IU] | ORAL_CAPSULE | ORAL | Status: DC
  Filled 2017-02-11: qty 1

## 2017-02-11 MED ORDER — POVIDONE-IODINE 10 % EX SWAB: 2.0000 "application " | Freq: Once | CUTANEOUS | Status: DC

## 2017-02-11 MED ORDER — ONDANSETRON HCL 4 MG/2ML IJ SOLN
4.0000 mg | Freq: Once | INTRAMUSCULAR | Status: AC
  Administered 2017-02-11: 4 mg via INTRAVENOUS
  Filled 2017-02-11: qty 2

## 2017-02-11 MED ORDER — LATANOPROST 0.005 % OP SOLN
1.0000 [drp] | Freq: Every day | OPHTHALMIC | Status: DC
  Administered 2017-02-12 – 2017-02-15 (×4): 1 [drp] via OPHTHALMIC
  Filled 2017-02-11: qty 2.5

## 2017-02-11 MED ORDER — PANTOPRAZOLE SODIUM 40 MG PO TBEC
40.0000 mg | DELAYED_RELEASE_TABLET | Freq: Every day | ORAL | Status: DC
  Administered 2017-02-13 – 2017-02-16 (×3): 40 mg via ORAL
  Filled 2017-02-11 (×4): qty 1

## 2017-02-11 MED ORDER — DIVALPROEX SODIUM 250 MG PO DR TAB
250.0000 mg | DELAYED_RELEASE_TABLET | Freq: Two times a day (BID) | ORAL | Status: DC
  Administered 2017-02-12 – 2017-02-15 (×6): 250 mg via ORAL
  Filled 2017-02-11 (×9): qty 1

## 2017-02-11 MED ORDER — SODIUM CHLORIDE 0.9 % IV BOLUS (SEPSIS)
250.0000 mL | Freq: Once | INTRAVENOUS | Status: AC
  Administered 2017-02-11: 250 mL via INTRAVENOUS

## 2017-02-11 MED ORDER — ACETAMINOPHEN 325 MG PO TABS
650.0000 mg | ORAL_TABLET | Freq: Four times a day (QID) | ORAL | Status: DC | PRN
  Administered 2017-02-16: 650 mg via ORAL
  Filled 2017-02-11: qty 2

## 2017-02-11 MED ORDER — HYPROMELLOSE (GONIOSCOPIC) 2.5 % OP SOLN
1.0000 [drp] | Freq: Two times a day (BID) | OPHTHALMIC | Status: DC
  Filled 2017-02-11: qty 15

## 2017-02-11 MED ORDER — ENOXAPARIN SODIUM 40 MG/0.4ML ~~LOC~~ SOLN: 40.0000 mg | SUBCUTANEOUS | Status: DC

## 2017-02-11 MED ORDER — BENAZEPRIL HCL 10 MG PO TABS
20.0000 mg | ORAL_TABLET | Freq: Two times a day (BID) | ORAL | Status: DC
  Filled 2017-02-11: qty 2

## 2017-02-11 MED ORDER — SACCHAROMYCES BOULARDII 250 MG PO CAPS: 250.0000 mg | ORAL_CAPSULE | Freq: Every morning | ORAL | Status: DC

## 2017-02-11 MED ORDER — SODIUM CHLORIDE 0.9 % IV SOLN
INTRAVENOUS | Status: DC
  Administered 2017-02-13 – 2017-02-14 (×3): via INTRAVENOUS

## 2017-02-11 MED ORDER — MELATONIN 3 MG PO TABS
1.0000 | ORAL_TABLET | Freq: Every day | ORAL | Status: DC
  Administered 2017-02-12 – 2017-02-15 (×4): 3 mg via ORAL
  Filled 2017-02-11 (×6): qty 1

## 2017-02-11 MED ORDER — CHLORHEXIDINE GLUCONATE 4 % EX LIQD
60.0000 mL | Freq: Once | CUTANEOUS | Status: AC
  Administered 2017-02-12: 60 mL via TOPICAL
  Filled 2017-02-11: qty 15

## 2017-02-11 MED ORDER — CALCITONIN (SALMON) 200 UNIT/ACT NA SOLN
1.0000 | Freq: Every day | NASAL | Status: DC
  Administered 2017-02-13: 1 via NASAL
  Filled 2017-02-11: qty 3.7

## 2017-02-11 MED ORDER — ACETAMINOPHEN 650 MG RE SUPP: 650.0000 mg | Freq: Four times a day (QID) | RECTAL | Status: DC | PRN

## 2017-02-11 MED ORDER — INSULIN ASPART 100 UNIT/ML ~~LOC~~ SOLN
0.0000 [IU] | Freq: Three times a day (TID) | SUBCUTANEOUS | Status: DC
  Administered 2017-02-12 (×2): 2 [IU] via SUBCUTANEOUS
  Administered 2017-02-13: 1 [IU] via SUBCUTANEOUS
  Administered 2017-02-13 – 2017-02-14 (×4): 2 [IU] via SUBCUTANEOUS
  Administered 2017-02-15 (×2): 1 [IU] via SUBCUTANEOUS
  Administered 2017-02-15: 3 [IU] via SUBCUTANEOUS
  Administered 2017-02-16: 2 [IU] via SUBCUTANEOUS

## 2017-02-11 MED ORDER — SERTRALINE HCL 50 MG PO TABS
50.0000 mg | ORAL_TABLET | Freq: Every day | ORAL | Status: DC
  Administered 2017-02-13 – 2017-02-14 (×2): 50 mg via ORAL
  Filled 2017-02-11 (×4): qty 1

## 2017-02-11 MED ORDER — QUETIAPINE FUMARATE 25 MG PO TABS
25.0000 mg | ORAL_TABLET | Freq: Three times a day (TID) | ORAL | Status: DC
  Administered 2017-02-12 – 2017-02-14 (×5): 25 mg via ORAL
  Filled 2017-02-11 (×8): qty 1

## 2017-02-11 NOTE — ED Notes (Signed)
Report to Suly, RN 300 

## 2017-02-11 NOTE — ED Notes (Signed)
Purewick placed on pt at this time.  °

## 2017-02-11 NOTE — ED Provider Notes (Addendum)
[facility] EMERGENCY DEPARTMENT Provider Note   CSN: [id number] Arrival date & time: 02/11/17  1847     History   Chief Complaint Chief Complaint  Patient presents with  . Fall    HPI Alexis Reese is a 82 y.o. female.  Patient from [place] facility.  She fell from a standing position.  Patient with complaint of right ankle pain is swelling and bruising there.  Also with complaint of right hip pain.  Patient did not hit her head.  Patient did not have loss of consciousness.      Past Medical History:  Diagnosis Date  . Anxiety   . Anxiety   . Cancer ([place])   . Diabetes mellitus   . GERD (gastroesophageal reflux disease)   . Hypertension   . Stroke [facility])    TIA  . Thyroid disease     Patient Active Problem List   Diagnosis Date Noted  . Type II diabetes mellitus with complication, uncontrolled ([place]) 04/01/2014  . PBA (pseudobulbar affect) 09/15/2013  . Psychosis ([place]) 09/15/2013  . Vitamin D deficiency 09/15/2013  . Dyslipidemia 09/15/2013  . Hypothyroidism 09/15/2013  . Anxiety and depression 05/28/2013  . Dementia with behavioral disturbance 05/28/2013  . B12 deficiency 05/28/2013  . Essential hypertension, benign 05/28/2013  . Obesity, unspecified 05/27/2012  . Back pain 05/24/2012  . Constipation 05/24/2012  . Degenerative arthritis of lumbar spine 05/24/2012  . Other secondary scoliosis, lumbar region 05/24/2012  . Depression with anxiety 05/24/2012    Past Surgical History:  Procedure Laterality Date  . CHOLECYSTECTOMY    . MASTECTOMY      OB History    Gravida Para Term Preterm AB Living   2 2 2     2    SAB TAB Ectopic Multiple Live Births                   Home Medications    Prior to Admission medications   Medication Sig Start Date End Date Taking? Authorizing Provider  acetaminophen (TYLENOL) 325 MG tablet Take 650 mg by mouth every 6 (six) hours as needed for mild pain or fever.    [provider]    albuterol (ACCUNEB) 0.63 MG/3ML nebulizer solution Take 1 ampule by nebulization every 4 (four) hours as needed for wheezing.    [provider]  amLODipine (NORVASC) 2.5 MG tablet Take 1 tablet (2.5 mg total) by mouth daily. 04/01/14   Gerlene Fee, NP  aspirin EC 81 MG tablet Take 81 mg by mouth daily.    [provider]  atorvastatin (LIPITOR) 10 MG tablet Take 10 mg by mouth daily.    [provider]  benazepril (LOTENSIN) 20 MG tablet Take 20 mg by mouth 2 (two) times daily.    [provider]  calcitonin, salmon, (MIACALCIN/FORTICAL) 200 UNIT/ACT nasal spray Place 1 spray into the nose daily. Alternating sids of  Nose daily    [provider]  cetirizine (ZYRTEC) 10 MG tablet Take 10 mg by mouth daily.    [provider]  clonazePAM Bobbye Charleston) 0.5 MG tablet Take one tablet by mouth every 8 hours for anxiety 03/20/14   Estill Dooms, MD  cyanocobalamin (,VITAMIN B-12,) 1000 MCG/ML injection Inject 1,000 mcg into the skin every 30 (thirty) days.    [provider]  Dextromethorphan-Quinidine (NUEDEXTA) 20-10 MG CAPS Take 1 tablet by mouth 2 (two) times daily.    [provider]  divalproex (DEPAKOTE SPRINKLE) 125  MG capsule Take 125 mg by mouth 2 (two) times daily.    [provider]  DULoxetine (CYMBALTA) 20 MG capsule Take 40 mg by mouth daily.     [provider]  fluticasone (FLONASE) 50 MCG/ACT nasal spray Place 1 spray into both nostrils daily.    [provider]  insulin aspart (NOVOLOG) 100 UNIT/ML injection Inject 5 Units into the skin 3 (three) times daily before meals. For cbg >=150    [provider]  insulin detemir (LEVEMIR) 100 UNIT/ML injection Inject 15 Units into the skin at bedtime.    [provider]  levothyroxine (SYNTHROID, LEVOTHROID) 125 MCG tablet Take 75 mcg by mouth daily before breakfast.     [provider]  linagliptin (TRADJENTA) 5  MG TABS tablet Take 5 mg by mouth daily.    [provider]  Melatonin 3 MG CAPS Take 1 capsule by mouth daily.    [provider]  QUEtiapine (SEROQUEL) 25 MG tablet Take 25 mg by mouth 2 (two) times daily.    [provider]  QUEtiapine (SEROQUEL) 50 MG tablet Take 150 mg by mouth at bedtime.  05/21/13   Carmin Muskrat, MD  sennosides-docusate sodium (SENOKOT-S) 8.6-50 MG tablet Take 2 tablets by mouth daily.    [provider]  traMADol Veatrice Bourbon) 50 MG tablet Take one tablet by mouth every 8 hours as needed for pain 05/02/14   Estill Dooms, MD    Family History History reviewed. No pertinent family history.  Social History Social History   Tobacco Use  . Smoking status: Never Smoker  . Smokeless tobacco: Never Used  Substance Use Topics  . Alcohol use: No  . Drug use: No     Allergies   Codeine; Influenza vac split quad; Morphine and related; and Tuberculin tests   Review of Systems Review of Systems  Constitutional: Negative for fever.  HENT: Negative for congestion.   Eyes: Negative for visual disturbance.  Respiratory: Negative for shortness of breath.   Cardiovascular: Negative for chest pain.  Gastrointestinal: Negative for abdominal pain.  Genitourinary: Negative for dysuria.  Musculoskeletal: Negative for back pain.  Neurological: Negative for headaches.  Hematological: Does not bruise/bleed easily.  Psychiatric/Behavioral: Negative for confusion.     Physical Exam Updated Vital Signs BP 138/70 (BP Location: Right Arm)   Pulse 78   Temp 97.6 F (36.4 C) (Oral)   Resp 18   SpO2 96%   Physical Exam  Constitutional: She appears well-developed and well-nourished. No distress.  HENT:  Head: Normocephalic and atraumatic.  Mouth/Throat: Oropharynx is clear and moist.  Eyes: Conjunctivae and EOM are normal. Pupils are equal, round, and reactive to light.  Neck: Normal range of motion. Neck supple.  No pain to palpation  to the posterior cervical spine.  Cardiovascular: Normal rate, regular rhythm and normal heart sounds.  Pulmonary/Chest: Effort normal and breath sounds normal.  Abdominal: Soft. Bowel sounds are normal.  Musculoskeletal: Normal range of motion. She exhibits edema and tenderness.  Swelling and tenderness to palpation to the right ankle.  Some ecchymosis.  Dorsalis pedis pulses 2+.  Sensation is intact.  No pain with movement of the right hip or knee.  No pain to palpation to the posterior cervical spine or lumbar or thoracic spine.  Neurological: She is alert. No cranial nerve deficit or sensory deficit. She exhibits normal muscle tone. Coordination normal.  Nursing note and vitals reviewed.    ED Treatments / Results  Labs (all  labs ordered are listed, but only abnormal results are displayed) Labs Reviewed  CBC WITH DIFFERENTIAL/PLATELET - Abnormal; Notable for the following components:      Result Value   Platelets 141 (*)    Neutro Abs 7.8 (*)    All other components within normal limits  BASIC METABOLIC PANEL - Abnormal; Notable for the following components:   Chloride 97 (*)    Glucose, Bld 249 (*)    GFR calc non Af Amer 58 (*)    All other components within normal limits    EKG  EKG Interpretation None       Radiology Dg Tibia/fibula Right  Result Date: 02/11/2017 CLINICAL DATA:  Patient status post fall. EXAM: RIGHT TIBIA AND FIBULA - 2 VIEW COMPARISON:  None. FINDINGS: Comminuted oblique fracture through the distal fibula. Comminuted medial malleolus fracture. Dislocation of the talus. Overlying soft tissue swelling about the ankle. No evidence for acute fracture of the proximal tibia or fibula. Knee joint degenerative changes. IMPRESSION: See dedicated ankle radiographs for description of distal fibula fracture, medial malleolus fracture and talar dislocation. No evidence for proximal tibial or fibular fracture. Electronically Signed   By: Lovey Newcomer M.D.   On:  02/11/2017 20:14   Dg Ankle Complete Right  Result Date: 02/11/2017 CLINICAL DATA:  Patient fell and presents with right ankle pain. EXAM: RIGHT ANKLE - COMPLETE 3+ VIEW COMPARISON:  None. FINDINGS: Acute, closed, trimalleolar fracture-subluxation of the right ankle is noted. A transverse fracture through the medial malleolus, coronal fracture through posterior malleolus and comminuted coronal oblique fracture of the distal diaphysis through metadiaphysis of the fibula with extension into the distal tibiofibular syndesmosis is noted. There is widening of the medial clear space of the ankle mortise by 13 mm. There is marked soft tissue swelling about the malleoli. The subtalar and midfoot articulations appear congruent. No calcaneal fracture. Intact base of fifth metatarsal. IMPRESSION: Acute, closed, trimalleolar fracture - subluxation of the right ankle as above described with associated marked soft tissue swelling. Widening of the medial clear space of the ankle mortise by 13 mm due to the fracture-subluxation. Electronically Signed   By: Ashley Royalty M.D.   On: 02/11/2017 20:15   Dg Hip Unilat W Or Wo Pelvis 2-3 Views Right  Result Date: 02/11/2017 CLINICAL DATA:  Fall.  Pain. EXAM: DG HIP (WITH OR WITHOUT PELVIS) 2-3V RIGHT COMPARISON:  None. FINDINGS: There is no evidence of hip fracture or dislocation. There is no evidence of arthropathy or other focal bone abnormality. IMPRESSION: Negative. Electronically Signed   By: Misty Stanley M.D.   On: 02/11/2017 20:14    Procedures Procedures (including critical care time)  Medications Ordered in ED Medications  0.9 %  sodium chloride infusion ( Intravenous Rate/Dose Change 02/11/17 2118)  sodium chloride 0.9 % bolus 250 mL (0 mLs Intravenous Stopped 02/11/17 2118)  ondansetron (ZOFRAN) injection 4 mg (4 mg Intravenous Given 02/11/17 2033)  fentaNYL (SUBLIMAZE) injection 25 mcg (25 mcg Intravenous Given 02/11/17 2033)     Initial Impression /  Assessment and Plan / ED Course  I have reviewed the triage vital signs and the nursing notes.  Pertinent labs & imaging results that were available during my care of the patient were reviewed by me and considered in my medical decision making (see chart for details).    Patient with a fall.  X-rays of right leg right ankle and right hip and pelvis with evidence of a trimalleolar fracture of the right ankle.  No significant dislocation.  The mortise does not line up completely normal.  Patient placed in a stirrup splint.  Discussed with Dr. Aline Brochure.  He will admit and consultation for operative repair of the ankle fracture.  Discussed with hospitalist they will admit.  Patient has a history of diabetes and hypertension.  Patient is not on blood thinners.  Final Clinical Impressions(s) / ED Diagnoses   Final diagnoses:  Fall, initial encounter  Closed triplanar ankle fracture, right, initial encounter    ED Discharge Orders    None       Fredia Sorrow, MD 02/11/17 2232    Fredia Sorrow, MD 03/05/17 1807

## 2017-02-11 NOTE — H&P (Signed)
TRH H&P   Patient Demographics:    Alexis Reese, is a 82 y.o. female  MRN: [id number]   DOB - 1935/01/12  Admit Date - 02/11/2017  Outpatient Primary MD for the patient is Gildardo Cranker, DO  Referring MD/NP/PA: Aundria Rud  Outpatient Specialists:   Patient coming from:  [facility]  Chief Complaint  Patient presents with  . Fall      HPI:    Alexis Reese  is a 82 y.o. female, w CVA, Dm2, Hypertension, Anxiety who presents with mechanical fall while trying to walk with walker to the bathroom.  Pt denies syncope, cp, palp, sob, lower ext edema.   In ED,   Xray  IMPRESSION: See dedicated ankle radiographs for description of distal fibula fracture, medial malleolus fracture and talar dislocation. No evidence for proximal tibial or fibular fracture.  Glucose 249 Bun 20, Creatinne 0.91 Wbc9.5, Hgb 14.0, Plt 141  Pt will be admitted for right ankle fracture     Review of systems:    In addition to the HPI above,  No Fever-chills, No Headache, No changes with Vision or hearing, No problems swallowing food or Liquids, No Chest pain, Cough or Shortness of Breath, No Abdominal pain, No Nausea or Vommitting, Bowel movements are regular, No Blood in stool or Urine, No dysuria, No new skin rashes or bruises,  No new weakness, tingling, numbness in any extremity, No recent weight gain or loss, No polyuria, polydypsia or polyphagia, No significant Mental Stressors.  A full 10 point Review of Systems was done, except as stated above, all other Review of Systems were negative.   With Past History of the following :    Past Medical History:  Diagnosis Date  . Anxiety   . Anxiety   . Cancer ([place])   . Diabetes mellitus   . GERD (gastroesophageal reflux disease)   . Hypertension   . Stroke [facility])    TIA  . Thyroid disease       Past Surgical History:    Procedure Laterality Date  . CHOLECYSTECTOMY    . MASTECTOMY        Social History:     Social History   Tobacco Use  . Smoking status: Never Smoker  . Smokeless tobacco: Never Used  Substance Use Topics  . Alcohol use: No     Lives - at [place] by self   Family History :     Family History  Family history unknown: Yes  pt can't recall    Home Medications:   Prior to Admission medications   Medication Sig Start Date End Date Taking? Authorizing Provider  acetaminophen (TYLENOL) 325 MG tablet Take 650 mg by mouth every 6 (six) hours as needed for mild pain or fever.   Yes [provider]  amLODipine (NORVASC) 2.5 MG tablet Take 1 tablet (2.5  mg total) by mouth daily. 04/01/14  Yes Gerlene Fee, NP  aspirin EC 81 MG tablet Take 81 mg by mouth daily.   Yes [provider]  atorvastatin (LIPITOR) 10 MG tablet Take 10 mg by mouth daily.   Yes [provider]  benazepril (LOTENSIN) 20 MG tablet Take 20 mg by mouth 2 (two) times daily.   Yes [provider]  calcitonin, salmon, (MIACALCIN/FORTICAL) 200 UNIT/ACT nasal spray Place 1 spray into the nose daily. Alternating sids of  Nose daily   Yes [provider]  cetirizine (ZYRTEC) 10 MG tablet Take 5 mg daily by mouth.    Yes [provider]  clonazePAM (KLONOPIN) 0.5 MG tablet Take one tablet by mouth every 8 hours for anxiety 03/20/14  Yes Estill Dooms, MD  cyanocobalamin (,VITAMIN B-12,) 1000 MCG/ML injection Inject 1,000 mcg into the skin every 30 (thirty) days.   Yes [provider]  divalproex (DEPAKOTE SPRINKLE) 125 MG capsule Take 250 mg 2 (two) times daily by mouth.    Yes [provider]  HYDROcodone-acetaminophen (NORCO/VICODIN) 5-325 MG tablet Take 1 tablet 2 (two) times daily after a meal by mouth.   Yes [provider]  insulin aspart (NOVOLOG) 100 UNIT/ML injection Inject 7 Units 3 (three) times daily  before meals into the skin. For cbg >=150    Yes [provider]  insulin detemir (LEVEMIR) 100 UNIT/ML injection Inject 23 Units at bedtime into the skin.    Yes [provider]  latanoprost (XALATAN) 0.005 % ophthalmic solution Place 1 drop at bedtime into both eyes.   Yes [provider]  levothyroxine (SYNTHROID, LEVOTHROID) 75 MCG tablet Take 75 mcg by mouth daily before breakfast.    Yes [provider]  linagliptin (TRADJENTA) 5 MG TABS tablet Take 5 mg by mouth daily.   Yes [provider]  Melatonin 3 MG CAPS Take 1 capsule at bedtime by mouth.    Yes [provider]  mirabegron ER (MYRBETRIQ) 25 MG TB24 tablet Take 25 mg daily by mouth.   Yes [provider]  nystatin (NYSTATIN) powder Apply daily topically. To abdominal skin folds   Yes [provider]  Oyster Shell (OYSTER CALCIUM) 500 MG TABS tablet Take 500 mg of elemental calcium 2 (two) times daily by mouth.   Yes [provider]  pantoprazole (PROTONIX) 40 MG tablet Take 40 mg daily by mouth.   Yes [provider]  Polyethyl Glycol-Propyl Glycol (SYSTANE) 0.4-0.3 % SOLN Apply 1 drop 2 (two) times daily to eye.   Yes [provider]  QUEtiapine (SEROQUEL) 25 MG tablet Take 25 mg 3 (three) times daily by mouth.    Yes [provider]  saccharomyces boulardii (FLORASTOR) 250 MG capsule Take 250 mg every morning by mouth.   Yes [provider]  sennosides-docusate sodium (SENOKOT-S) 8.6-50 MG tablet Take 2 tablets 2 (two) times daily by mouth.    Yes [provider]  sertraline (ZOLOFT) 50 MG tablet Take 50 mg daily by mouth.   Yes [provider]  traMADol (ULTRAM) 50 MG tablet Take one tablet by mouth every 8 hours as needed for pain Patient taking differently: Take 50 mg every 12 (twelve) hours as needed by mouth for moderate pain or severe pain.  05/02/14  Yes Estill Dooms, MD  Vitamin D,  Ergocalciferol, (DRISDOL) 50000 units CAPS capsule Take 50,000 Units every 30 (thirty) days by mouth.   Yes [provider]  Vitamins A & D (VITAMIN A & D) ointment Apply 1 application every other day topically. To vaginal opening   Yes [provider]     Allergies:     Allergies  Allergen Reactions  . Codeine   . Influenza Vac Split Quad   . Morphine And Related   . Tuberculin Tests      Physical Exam:   Vitals  Blood pressure 132/71, pulse 80, temperature 97.6 F (36.4 C), temperature source Oral, resp. rate 14, SpO2 90 %.   1. General  lying in bed in NAD,    2. Normal affect and insight, Not Suicidal or Homicidal, Awake Alert, Oriented X 3.  3. No F.N deficits, ALL C.Nerves Intact, Strength 5/5 all 4 extremities, Sensation intact all 4 extremities, Plantars down going.  4. Ears and Eyes appear Normal, Conjunctivae clear, PERRLA. Moist Oral Mucosa.  5. Supple Neck, No JVD, No cervical lymphadenopathy appriciated, No Carotid Bruits.  6. Symmetrical Chest wall movement, Good air movement bilaterally, CTAB.  7. RRR, No Gallops, Rubs or Murmurs, No Parasternal Heave.  8. Positive Bowel Sounds, Abdomen Soft, No tenderness, No organomegaly appriciated,No rebound -guarding or rigidity.  9.  No Cyanosis, Normal Skin Turgor, No Skin Rash or Bruise.  10. Good muscle tone,  joints appear normal , no effusions, Normal ROM.  11. No Palpable Lymph Nodes in Neck or Axillae  R ankle in splint   Data Review:    CBC Recent Labs  Lab 02/11/17 2035  WBC 9.5  HGB 14.0  HCT 42.2  PLT 141*  MCV 94.2  MCH 31.3  MCHC 33.2  RDW 12.4  LYMPHSABS 1.1  MONOABS 0.5  EOSABS 0.1  BASOSABS 0.0   ------------------------------------------------------------------------------------------------------------------  Chemistries  Recent Labs  Lab 02/11/17 2035  NA 138  K 4.6  CL 97*  CO2 31  GLUCOSE 249*  BUN 20  CREATININE 0.91  CALCIUM 9.5    ------------------------------------------------------------------------------------------------------------------ CrCl cannot be calculated (Unknown ideal weight.). ------------------------------------------------------------------------------------------------------------------ No results for input(s): TSH, T4TOTAL, T3FREE, THYROIDAB in the last 72 hours.  Invalid input(s): FREET3  Coagulation profile No results for input(s): INR, PROTIME in the last 168 hours. ------------------------------------------------------------------------------------------------------------------- No results for input(s): DDIMER in the last 72 hours. -------------------------------------------------------------------------------------------------------------------  Cardiac Enzymes No results for input(s): CKMB, TROPONINI, MYOGLOBIN in the last 168 hours.  Invalid input(s): CK ------------------------------------------------------------------------------------------------------------------ No results found for: BNP   ---------------------------------------------------------------------------------------------------------------  Urinalysis    Component Value Date/Time   COLORURINE YELLOW 05/17/2013 [place] 05/17/2013 2305   LABSPEC 1.015 05/17/2013 2305   PHURINE 6.5 05/17/2013 2305   GLUCOSEU NEGATIVE 05/17/2013 2305   HGBUR NEGATIVE 05/17/2013 2305   BILIRUBINUR NEGATIVE 05/17/2013 2305   KETONESUR NEGATIVE 05/17/2013 2305   PROTEINUR NEGATIVE 05/17/2013 2305   UROBILINOGEN 1.0 05/17/2013 2305   NITRITE NEGATIVE 05/17/2013 2305   LEUKOCYTESUR NEGATIVE 05/17/2013 2305    ----------------------------------------------------------------------------------------------------------------   Imaging Results:    Dg Tibia/fibula Right  Result Date: 02/11/2017 CLINICAL DATA:  Patient status post fall. EXAM: RIGHT TIBIA AND FIBULA - 2 VIEW COMPARISON:  None. FINDINGS: Comminuted  oblique fracture through the distal fibula. Comminuted medial malleolus fracture. Dislocation of the talus. Overlying soft tissue swelling about the ankle. No evidence for acute fracture of the proximal tibia or fibula. Knee joint degenerative changes. IMPRESSION: See dedicated ankle radiographs for description of distal fibula fracture, medial malleolus fracture and talar dislocation. No evidence for proximal tibial or fibular fracture. Electronically Signed   By: Lovey Newcomer  M.D.   On: 02/11/2017 20:14   Dg Ankle Complete Right  Result Date: 02/11/2017 CLINICAL DATA:  Patient fell and presents with right ankle pain. EXAM: RIGHT ANKLE - COMPLETE 3+ VIEW COMPARISON:  None. FINDINGS: Acute, closed, trimalleolar fracture-subluxation of the right ankle is noted. A transverse fracture through the medial malleolus, coronal fracture through posterior malleolus and comminuted coronal oblique fracture of the distal diaphysis through metadiaphysis of the fibula with extension into the distal tibiofibular syndesmosis is noted. There is widening of the medial clear space of the ankle mortise by 13 mm. There is marked soft tissue swelling about the malleoli. The subtalar and midfoot articulations appear congruent. No calcaneal fracture. Intact base of fifth metatarsal. IMPRESSION: Acute, closed, trimalleolar fracture - subluxation of the right ankle as above described with associated marked soft tissue swelling. Widening of the medial clear space of the ankle mortise by 13 mm due to the fracture-subluxation. Electronically Signed   By: Ashley Royalty M.D.   On: 02/11/2017 20:15   Dg Hip Unilat W Or Wo Pelvis 2-3 Views Right  Result Date: 02/11/2017 CLINICAL DATA:  Fall.  Pain. EXAM: DG HIP (WITH OR WITHOUT PELVIS) 2-3V RIGHT COMPARISON:  None. FINDINGS: There is no evidence of hip fracture or dislocation. There is no evidence of arthropathy or other focal bone abnormality. IMPRESSION: Negative. Electronically Signed   By:  Misty Stanley M.D.   On: 02/11/2017 20:14      Assessment & Plan:    Principal Problem:   Ankle fracture Active Problems:   Diabetes mellitus without complication (HCC)   Hypothyroidism    R ankle fracture (close trimalleolar fracture) NPO  Ns iv Pain control Pt is medically cleared to have surgery , benefits outweight the risk  Orthopedics consulted by ED, will be by in am Physical therapy consult  Dm2 fsbs q4h, ISS Cont tradjenta HOLD LEVEMIR  Hypothyroidism Continue levthyroxine  Hypertension Cont amlodipine, cont benazepril   Gerd Cont potonix  Anxiety Cont clonazepam Cont depakote     DVT Prophylaxis  Lovenox,  SCDs  AM Labs Ordered, also please review Full Orders  Family Communication: Admission, patients condition and plan of care including tests being ordered have been discussed with the patient who indicate understanding and agree with the plan and Code Status.  Code Status FULL CODE  Likely DC to  SNF  Condition GUARDED    Consults called: orthopedics by ED  Admission status: inpatient  Time spent in minutes : 45   Jani Gravel M.D on 02/11/2017 at 9:53 PM  Between 7am to 7pm - Pager - [phone]  . After 7pm go to [email or web address] - password [facility]  [facility] - Office  [phone]

## 2017-02-11 NOTE — Progress Notes (Signed)
Patient ID: Alexis Reese, female   DOB: Dec 27, 1934, 82 y.o.   MRN: [id number]  BP 129/62 (BP Location: Left Arm)   Pulse 78   Temp 97.6 F (36.4 C) (Oral)   Resp (!) 23   SpO2 100%    There is no height or weight on file to calculate BMI.  Right ankle trimalleolar fracture   D Past Medical History:  Diagnosis Date  . Anxiety   . Anxiety   . Cancer ([place])   . Diabetes mellitus   . GERD (gastroesophageal reflux disease)   . Hypertension   . Stroke [facility])    TIA  . Thyroid disease     I will evaluate in the am for surgical consideration

## 2017-02-11 NOTE — ED Triage Notes (Signed)
Pt from [place] facility where she fell today. Hurting to right ankle. Splinting in place from ems. Denies LOC,

## 2017-02-11 NOTE — ED Notes (Signed)
NP from [facility] called for update on pt

## 2017-02-12 ENCOUNTER — Inpatient Hospital Stay (HOSPITAL_COMMUNITY): Payer: Medicare Other | Admitting: Anesthesiology

## 2017-02-12 ENCOUNTER — Encounter (HOSPITAL_COMMUNITY)
Admission: EM | Disposition: A | Discharge status: Skilled Nursing Facility | Payer: Self-pay | Source: Home / Self Care | Attending: Internal Medicine

## 2017-02-12 ENCOUNTER — Encounter (HOSPITAL_COMMUNITY): Payer: Self-pay | Admitting: Anesthesiology

## 2017-02-12 ENCOUNTER — Inpatient Hospital Stay (HOSPITAL_COMMUNITY): Payer: Medicare Other

## 2017-02-12 DIAGNOSIS — E038 Other specified hypothyroidism: Secondary | ICD-10-CM

## 2017-02-12 DIAGNOSIS — S82851G Displaced trimalleolar fracture of right lower leg, subsequent encounter for closed fracture with delayed healing: Secondary | ICD-10-CM

## 2017-02-12 DIAGNOSIS — G9341 Metabolic encephalopathy: Secondary | ICD-10-CM

## 2017-02-12 DIAGNOSIS — I10 Essential (primary) hypertension: Secondary | ICD-10-CM

## 2017-02-12 DIAGNOSIS — S82851A Displaced trimalleolar fracture of right lower leg, initial encounter for closed fracture: Principal | ICD-10-CM

## 2017-02-12 HISTORY — PX: ORIF ANKLE FRACTURE: SHX5408

## 2017-02-12 LAB — CBC
HEMATOCRIT: 37 % (ref 36.0–46.0)
Hemoglobin: 12.1 g/dL (ref 12.0–15.0)
MCH: 31.3 pg (ref 26.0–34.0)
MCHC: 32.7 g/dL (ref 30.0–36.0)
MCV: 95.9 fL (ref 78.0–100.0)
PLATELETS: 131 10*3/uL — AB (ref 150–400)
RBC: 3.86 MIL/uL — AB (ref 3.87–5.11)
RDW: 12.6 % (ref 11.5–15.5)
WBC: 8.4 10*3/uL (ref 4.0–10.5)

## 2017-02-12 LAB — GLUCOSE, CAPILLARY
GLUCOSE-CAPILLARY: 150 mg/dL — AB (ref 65–99)
Glucose-Capillary: 153 mg/dL — ABNORMAL HIGH (ref 65–99)
Glucose-Capillary: 180 mg/dL — ABNORMAL HIGH (ref 65–99)
Glucose-Capillary: 79 mg/dL (ref 65–99)

## 2017-02-12 LAB — APTT: aPTT: 34 seconds (ref 24–36)

## 2017-02-12 LAB — HEMOGLOBIN A1C
HEMOGLOBIN A1C: 7 % — AB (ref 4.8–5.6)
MEAN PLASMA GLUCOSE: 154.2 mg/dL

## 2017-02-12 LAB — MRSA PCR SCREENING: MRSA by PCR: NEGATIVE

## 2017-02-12 LAB — SURGICAL PCR SCREEN
MRSA, PCR: NEGATIVE
STAPHYLOCOCCUS AUREUS: NEGATIVE

## 2017-02-12 SURGERY — OPEN REDUCTION INTERNAL FIXATION (ORIF) ANKLE FRACTURE
Anesthesia: Spinal | Site: Ankle | Laterality: Right

## 2017-02-12 MED ORDER — BUPIVACAINE-EPINEPHRINE (PF) 0.5% -1:200000 IJ SOLN
INTRAMUSCULAR | Status: DC | PRN
  Administered 2017-02-12: 60 mL

## 2017-02-12 MED ORDER — HALOPERIDOL LACTATE 5 MG/ML IJ SOLN
0.5000 mg | Freq: Once | INTRAMUSCULAR | Status: AC
  Administered 2017-02-12: 0.5 mg via INTRAVENOUS
  Filled 2017-02-12: qty 1

## 2017-02-12 MED ORDER — LORAZEPAM 2 MG/ML IJ SOLN
0.5000 mg | Freq: Once | INTRAMUSCULAR | Status: AC
  Administered 2017-02-12: 0.5 mg via INTRAVENOUS
  Filled 2017-02-12: qty 1

## 2017-02-12 MED ORDER — MIDAZOLAM HCL 2 MG/2ML IJ SOLN
INTRAMUSCULAR | Status: AC
  Filled 2017-02-12: qty 2

## 2017-02-12 MED ORDER — EPHEDRINE SULFATE 50 MG/ML IJ SOLN
INTRAMUSCULAR | Status: DC | PRN
  Administered 2017-02-12 (×4): 5 mg via INTRAVENOUS

## 2017-02-12 MED ORDER — CEFAZOLIN SODIUM-DEXTROSE 2-4 GM/100ML-% IV SOLN
INTRAVENOUS | Status: AC
  Filled 2017-02-12: qty 100

## 2017-02-12 MED ORDER — FENTANYL CITRATE (PF) 100 MCG/2ML IJ SOLN
INTRAMUSCULAR | Status: AC
  Filled 2017-02-12: qty 2

## 2017-02-12 MED ORDER — PROPOFOL 10 MG/ML IV BOLUS
INTRAVENOUS | Status: AC
  Filled 2017-02-12: qty 20

## 2017-02-12 MED ORDER — SEVOFLURANE IN SOLN
RESPIRATORY_TRACT | Status: AC
  Filled 2017-02-12: qty 250

## 2017-02-12 MED ORDER — ARTIFICIAL TEARS OPHTHALMIC OINT
TOPICAL_OINTMENT | OPHTHALMIC | Status: AC
  Filled 2017-02-12: qty 3.5

## 2017-02-12 MED ORDER — LACTATED RINGERS IV SOLN
INTRAVENOUS | Status: DC
  Administered 2017-02-12 (×2): via INTRAVENOUS

## 2017-02-12 MED ORDER — FENTANYL CITRATE (PF) 100 MCG/2ML IJ SOLN
25.0000 ug | INTRAMUSCULAR | Status: DC | PRN
  Administered 2017-02-12 – 2017-02-14 (×12): 25 ug via INTRAVENOUS
  Filled 2017-02-12 (×12): qty 2

## 2017-02-12 MED ORDER — PROPOFOL 500 MG/50ML IV EMUL
INTRAVENOUS | Status: DC | PRN
  Administered 2017-02-12: 12:00:00 via INTRAVENOUS
  Administered 2017-02-12: 50 ug/kg/min via INTRAVENOUS

## 2017-02-12 MED ORDER — FENTANYL CITRATE (PF) 100 MCG/2ML IJ SOLN
INTRAMUSCULAR | Status: DC | PRN
  Administered 2017-02-12: 25 ug via INTRAVENOUS
  Administered 2017-02-12: 25 ug via INTRATHECAL
  Administered 2017-02-12: 25 ug via INTRAVENOUS

## 2017-02-12 MED ORDER — ENOXAPARIN SODIUM 30 MG/0.3ML ~~LOC~~ SOLN
30.0000 mg | SUBCUTANEOUS | Status: DC
  Administered 2017-02-13 – 2017-02-14 (×2): 30 mg via SUBCUTANEOUS
  Filled 2017-02-12 (×2): qty 0.3

## 2017-02-12 MED ORDER — CEFAZOLIN SODIUM-DEXTROSE 2-4 GM/100ML-% IV SOLN
2.0000 g | Freq: Four times a day (QID) | INTRAVENOUS | Status: AC
  Administered 2017-02-12 (×2): 2 g via INTRAVENOUS
  Filled 2017-02-12 (×2): qty 100

## 2017-02-12 MED ORDER — METOCLOPRAMIDE HCL 5 MG/ML IJ SOLN: 5.0000 mg | Freq: Three times a day (TID) | INTRAMUSCULAR | Status: DC | PRN

## 2017-02-12 MED ORDER — HYDROCODONE-ACETAMINOPHEN 5-325 MG PO TABS
1.0000 | ORAL_TABLET | Freq: Four times a day (QID) | ORAL | Status: DC | PRN
  Administered 2017-02-14: 2 via ORAL
  Filled 2017-02-12: qty 2

## 2017-02-12 MED ORDER — MENTHOL 3 MG MT LOZG: 1.0000 | LOZENGE | OROMUCOSAL | Status: DC | PRN

## 2017-02-12 MED ORDER — PHENOL 1.4 % MT LIQD: 1.0000 | OROMUCOSAL | Status: DC | PRN

## 2017-02-12 MED ORDER — LIDOCAINE HCL (PF) 1 % IJ SOLN
INTRAMUSCULAR | Status: AC
  Filled 2017-02-12: qty 5

## 2017-02-12 MED ORDER — MIDAZOLAM HCL 2 MG/2ML IJ SOLN
1.0000 mg | INTRAMUSCULAR | Status: AC | PRN
  Administered 2017-02-12 (×2): 1 mg via INTRAVENOUS

## 2017-02-12 MED ORDER — BUPIVACAINE IN DEXTROSE 0.75-8.25 % IT SOLN
INTRATHECAL | Status: DC | PRN
  Administered 2017-02-12: 13 mg via INTRATHECAL

## 2017-02-12 MED ORDER — POLYVINYL ALCOHOL 1.4 % OP SOLN
2.0000 [drp] | Freq: Every day | OPHTHALMIC | Status: DC
  Administered 2017-02-13 – 2017-02-15 (×4): 2 [drp] via OPHTHALMIC
  Filled 2017-02-12: qty 15

## 2017-02-12 MED ORDER — MORPHINE SULFATE (PF) 2 MG/ML IV SOLN: 0.5000 mg | INTRAVENOUS | Status: DC | PRN

## 2017-02-12 MED ORDER — SODIUM CHLORIDE 0.9 % IJ SOLN
INTRAMUSCULAR | Status: AC
  Filled 2017-02-12: qty 10

## 2017-02-12 MED ORDER — EPHEDRINE SULFATE 50 MG/ML IJ SOLN
INTRAMUSCULAR | Status: AC
Start: 2017-02-12 — End: ?
  Filled 2017-02-12: qty 1

## 2017-02-12 MED ORDER — FENTANYL CITRATE (PF) 100 MCG/2ML IJ SOLN
25.0000 ug | Freq: Once | INTRAMUSCULAR | Status: AC
  Administered 2017-02-12: 25 ug via INTRAVENOUS

## 2017-02-12 MED ORDER — BUPIVACAINE-EPINEPHRINE (PF) 0.5% -1:200000 IJ SOLN
INTRAMUSCULAR | Status: AC
  Filled 2017-02-12: qty 60

## 2017-02-12 MED ORDER — 0.9 % SODIUM CHLORIDE (POUR BTL) OPTIME
TOPICAL | Status: DC | PRN
  Administered 2017-02-12: 1000 mL

## 2017-02-12 MED ORDER — MIDAZOLAM HCL 5 MG/5ML IJ SOLN
INTRAMUSCULAR | Status: DC | PRN
  Administered 2017-02-12 (×2): 1 mg via INTRAVENOUS

## 2017-02-12 MED ORDER — METOCLOPRAMIDE HCL 10 MG PO TABS: 5.0000 mg | ORAL_TABLET | Freq: Three times a day (TID) | ORAL | Status: DC | PRN

## 2017-02-12 MED ORDER — MIDAZOLAM HCL 2 MG/2ML IJ SOLN
1.0000 mg | INTRAMUSCULAR | Status: DC
  Administered 2017-02-12 (×2): 1 mg via INTRAVENOUS
  Filled 2017-02-12: qty 2

## 2017-02-12 MED ORDER — HALOPERIDOL LACTATE 5 MG/ML IJ SOLN
5.0000 mg | Freq: Four times a day (QID) | INTRAMUSCULAR | Status: DC | PRN
  Administered 2017-02-12 – 2017-02-15 (×5): 5 mg via INTRAVENOUS
  Filled 2017-02-12 (×5): qty 1

## 2017-02-12 MED ORDER — FENTANYL CITRATE (PF) 100 MCG/2ML IJ SOLN: 25.0000 ug | INTRAMUSCULAR | Status: DC | PRN

## 2017-02-12 MED ORDER — PHENYLEPHRINE HCL 10 MG/ML IJ SOLN
INTRAMUSCULAR | Status: DC | PRN
  Administered 2017-02-12: 40 ug via INTRAVENOUS
  Administered 2017-02-12 (×2): 80 ug via INTRAVENOUS
  Administered 2017-02-12 (×5): 40 ug via INTRAVENOUS

## 2017-02-12 SURGICAL SUPPLY — 66 items
BANDAGE ELASTIC 3 LF NS (GAUZE/BANDAGES/DRESSINGS) ×3 IMPLANT
BANDAGE ELASTIC 4 LF NS (GAUZE/BANDAGES/DRESSINGS) ×6 IMPLANT
BANDAGE ESMARK 4X12 BL STRL LF (DISPOSABLE) ×1 IMPLANT
BIT DRILL 3.5X122MM AO FIT (BIT) ×2 IMPLANT
BLADE SURG SZ10 CARB STEEL (BLADE) ×3 IMPLANT
BNDG CMPR 12X4 ELC STRL LF (DISPOSABLE) ×1
BNDG CMPR MED 5X3 ELC HKLP NS (GAUZE/BANDAGES/DRESSINGS) ×1
BNDG CMPR MED 5X4 ELC HKLP NS (GAUZE/BANDAGES/DRESSINGS) ×2
BNDG COHESIVE 4X5 TAN STRL (GAUZE/BANDAGES/DRESSINGS) ×3 IMPLANT
BNDG ESMARK 4X12 BLUE STRL LF (DISPOSABLE) ×3
CHLORAPREP W/TINT 26ML (MISCELLANEOUS) ×6 IMPLANT
CLOTH BEACON ORANGE TIMEOUT ST (SAFETY) ×3 IMPLANT
COVER LIGHT HANDLE STERIS (MISCELLANEOUS) ×6 IMPLANT
CUFF TOURNIQUET SINGLE 34IN LL (TOURNIQUET CUFF) ×3 IMPLANT
DECANTER SPIKE VIAL GLASS SM (MISCELLANEOUS) ×6 IMPLANT
DRAPE C-ARM FOLDED MOBILE STRL (DRAPES) ×3 IMPLANT
DRAPE PROXIMA HALF (DRAPES) ×3 IMPLANT
DRILL 2.6X122MM WL AO SHAFT (BIT) ×2 IMPLANT
GAUZE SPONGE 4X4 12PLY STRL (GAUZE/BANDAGES/DRESSINGS) ×3 IMPLANT
GAUZE XEROFORM 5X9 LF (GAUZE/BANDAGES/DRESSINGS) ×3 IMPLANT
GLOVE BIOGEL PI IND STRL 7.0 (GLOVE) ×2 IMPLANT
GLOVE BIOGEL PI INDICATOR 7.0 (GLOVE) ×6
GLOVE SKINSENSE NS SZ8.0 LF (GLOVE) ×2
GLOVE SKINSENSE STRL SZ8.0 LF (GLOVE) ×1 IMPLANT
GLOVE SS N UNI LF 8.5 STRL (GLOVE) ×3 IMPLANT
GOWN STRL REUS W/TWL LRG LVL3 (GOWN DISPOSABLE) ×6 IMPLANT
GOWN STRL REUS W/TWL XL LVL3 (GOWN DISPOSABLE) ×3 IMPLANT
INST SET MINOR BONE (KITS) ×3 IMPLANT
K-WIRE 1.4X100 (WIRE) ×3
K-WIRE STRYKER (Wire) ×3 IMPLANT
KIT ROOM TURNOVER APOR (KITS) ×3 IMPLANT
KWIRE 1.4X100 (WIRE) IMPLANT
KWIRE STRYKER (Wire) IMPLANT
MANIFOLD NEPTUNE II (INSTRUMENTS) ×3 IMPLANT
NDL HYPO 21X1.5 SAFETY (NEEDLE) ×1 IMPLANT
NEEDLE HYPO 21X1.5 SAFETY (NEEDLE) ×3 IMPLANT
NS IRRIG 1000ML POUR BTL (IV SOLUTION) ×3 IMPLANT
PACK BASIC LIMB (CUSTOM PROCEDURE TRAY) ×3 IMPLANT
PAD ABD 5X9 TENDERSORB (GAUZE/BANDAGES/DRESSINGS) ×6 IMPLANT
PAD ARMBOARD 7.5X6 YLW CONV (MISCELLANEOUS) ×3 IMPLANT
PAD CAST 4YDX4 CTTN HI CHSV (CAST SUPPLIES) ×1 IMPLANT
PADDING CAST COTTON 4X4 STRL (CAST SUPPLIES) ×3
PADDING WEBRIL 4 STERILE (GAUZE/BANDAGES/DRESSINGS) ×4 IMPLANT
PLATE 8H 137MM (Plate) ×2 IMPLANT
SCREW 46X4.0MM (Screw) ×2 IMPLANT
SCREW BONE 14MMX3.5MM (Screw) ×2 IMPLANT
SCREW BONE 18 (Screw) ×2 IMPLANT
SCREW BONE 3.5X16MM (Screw) ×2 IMPLANT
SCREW BONE 3.5X20MM (Screw) ×2 IMPLANT
SCREW BONE NON-LCKING 3.5X12MM (Screw) ×2 IMPLANT
SCREW LOCK 3.5X14 (Screw) ×2 IMPLANT
SCREW LOCKING 3.5X12 (Screw) ×4 IMPLANT
SCREW LOCKING 3.5X18MM (Screw) ×4 IMPLANT
SET BASIN LINEN APH (SET/KITS/TRAYS/PACK) ×3 IMPLANT
SPLINT J IMMOBILIZER 4X20FT (CAST SUPPLIES) IMPLANT
SPLINT J PLASTER J 4INX20Y (CAST SUPPLIES) ×2
SPONGE GAUZE 4X4 12PLY (GAUZE/BANDAGES/DRESSINGS) ×2 IMPLANT
SPONGE LAP 18X18 X RAY DECT (DISPOSABLE) ×3 IMPLANT
STAPLER VISISTAT 35W (STAPLE) ×3 IMPLANT
SUT ETHILON 3 0 FSL (SUTURE) ×2 IMPLANT
SUT MON AB 0 CT1 (SUTURE) ×3 IMPLANT
SUT MON AB 2-0 CT1 36 (SUTURE) ×2 IMPLANT
SUT VIC AB 1 CT1 27 (SUTURE) ×3
SUT VIC AB 1 CT1 27XBRD ANTBC (SUTURE) IMPLANT
SYR 30ML LL (SYRINGE) ×3 IMPLANT
SYR BULB IRRIGATION 50ML (SYRINGE) ×3 IMPLANT

## 2017-02-12 NOTE — Op Note (Signed)
02/12/2017  12:15 PM  PATIENT:  Alexis Reese  82 y.o. female  PRE-OPERATIVE DIAGNOSIS:  right ankle fracture  POST-OPERATIVE DIAGNOSIS:  trimalleolar fracture right ankle  PROCEDURE:  Procedure(s): OPEN REDUCTION INTERNAL FIXATION (ORIF) RIGHT ANKLE FRACTURE (Right) (ORIF TRIMALLEOLAR FRACTURE RIGHT ANKLE WITHOUT FIXATION OF POSTERIOR MALLEOLUS).  STRYKER ANKLE IMPLANTS LOCKING AND NON-LOCKING SCREWS   SURGEON:  Surgeon(s) and Role:    * Carole Civil, MD - Primary  PHYSICIAN ASSISTANT:   ASSISTANTS: none   ANESTHESIA:   spinal  EBL:  25 mL   BLOOD ADMINISTERED:none  DRAINS: none   LOCAL MEDICATIONS USED:  MARCAINE     SPECIMEN:  No Specimen  DISPOSITION OF SPECIMEN:  N/A  COUNTS:  YES  TOURNIQUET:   Total Tourniquet Time Documented: area (laterality) - 77 minutes Total: area (laterality) - 77 minutes   DICTATION: .Viviann Spare Dictation  PLAN OF CARE: Admit to inpatient   PATIENT DISPOSITION:  PACU - hemodynamically stable.   Delay start of Pharmacological VTE agent (>24hrs) due to surgical blood loss or risk of bleeding: yes  27822  Details of the surgery  The patient's cast/splint was removed in the preop area and the skin was checked and there were no blisters so she was taken to surgery after the chart was reviewed and the site was confirmed and marked.  She went to the operating room we started her Ancef.  She had a spinal anesthetic and then was placed in the supine position  We placed a sterile Foley catheter  Her right lower extremity was prepped and draped sterilely and then a timeout was taken and site was confirmed as well as the other timeout parameters  The limb was exsanguinated with a 4 inch Esmarch the tourniquet was elevated to 300 mmHg.  I made a lateral incision full-thickness down to bone and carried it proximally opening the perineal fascia  I exposed the bone and fracture debrided the fracture irrigated it and then performed  open reduction and then applied clamps to hold the reduction and took an x-ray.  The mortise was reduced and I proceeded to place an interfragmentary screw using a 3.5 drill bit proximally and then a 2.5 drill bit distally followed by measuring and placement of an interfrag screw which was a 3.5 screw.  This stabilized the fracture.  I then placed a cluster 8 hole plate leaving the last proximal hole empty to ensure that 4 screws were proximal and distal to the fracture.  This was placed by first using nonlocking screws starting at the fracture and progressing proximally and distally alternating each side of the fracture.  After the first 2 screws were placed I took an x-ray to confirm reduction and mortise positioning  Once this was deemed satisfactory I proceeded to place the rest of the screws  I then turned my attention medially where I made a curvilinear incision over the medial malleolus fracture.  I irrigated and debrided the fracture check the joint at the medial talar area and irrigated that out.  There was no chondral lesion there.  I then took a bone clamp and reduced the medial malleolar fracture and held it with a K wire.  X-ray was obtained and showed reduction  I then placed a 4.5 partially threaded screw and then 1 1.4 mm K wire.  X-rays confirm reduction of the medial malleolar fracture and no disturbance of the lateral fixation  I then bent the pin and hammered it into place sure there  was sticking out.  I then irrigated both wounds copiously and then closed the medial wound with 3-0 nylon suture W/ RUEDI - ALLGOWER stitch in interrupted fashion.  I closed the lateral wound with 0 Monocryl and staples  I injected 60 cc of Marcaine 30 cc of each side of the ankle  Sterile dressings were applied followed by  I placed a well-padded sugar tong splint with the foot at neutral position to the leg  The patient was then taken to recovery room in stable condition.  She is  nonweightbearing for the foreseeable future probably 12 weeks  X-rays will be taken at 2, 6 and 12 weeks.

## 2017-02-12 NOTE — Progress Notes (Signed)
**Note De-Identified  Obfuscation** Patient refused IS therapy.  RT will attempt later. IS at bedside

## 2017-02-12 NOTE — Brief Op Note (Signed)
02/12/2017  12:15 PM  PATIENT:  Rito Ehrlich Preuss  82 y.o. female  PRE-OPERATIVE DIAGNOSIS:  right ankle fracture  POST-OPERATIVE DIAGNOSIS:  trimalleolar fracture right ankle  PROCEDURE:  Procedure(s): OPEN REDUCTION INTERNAL FIXATION (ORIF) RIGHT ANKLE FRACTURE (Right) (ORIF TRIMALLEOLAR FRACTURE RIGHT ANKLE WITHOUT FIXATION OF POSTERIOR MALLEOLUS).  STRYKER ANKLE IMPLANTS LOCKING AND NON-LOCKING SCREWS   SURGEON:  Surgeon(s) and Role:    * Carole Civil, MD - Primary  PHYSICIAN ASSISTANT:   ASSISTANTS: none   ANESTHESIA:   spinal  EBL:  25 mL   BLOOD ADMINISTERED:none  DRAINS: none   LOCAL MEDICATIONS USED:  MARCAINE     SPECIMEN:  No Specimen  DISPOSITION OF SPECIMEN:  N/A  COUNTS:  YES  TOURNIQUET:   Total Tourniquet Time Documented: area (laterality) - 77 minutes Total: area (laterality) - 77 minutes   DICTATION: .Viviann Spare Dictation  PLAN OF CARE: Admit to inpatient   PATIENT DISPOSITION:  PACU - hemodynamically stable.   Delay start of Pharmacological VTE agent (>24hrs) due to surgical blood loss or risk of bleeding: yes  27822

## 2017-02-12 NOTE — Anesthesia Postprocedure Evaluation (Signed)
Anesthesia Post Note  Patient: Alexis Reese  Procedure(s) Performed: OPEN REDUCTION INTERNAL FIXATION (ORIF) RIGHT ANKLE FRACTURE (Right Ankle)  Patient location during evaluation: Nursing Unit Anesthesia Type: Spinal Level of consciousness: awake and alert, oriented and patient cooperative Pain management: pain level controlled Vital Signs Assessment: post-procedure vital signs reviewed and stable Respiratory status: spontaneous breathing and respiratory function stable Cardiovascular status: stable Postop Assessment: spinal receding Anesthetic complications: no     Last Vitals:  Vitals:   02/12/17 1300 02/12/17 1315  BP: (!) 134/56 122/62  Pulse: (!) 104 (!) 104  Resp: 17 17  Temp:    SpO2: 98% 100%    Last Pain:  Vitals:   02/12/17 1245  TempSrc:   PainSc: Asleep                 ADAMS, AMY A

## 2017-02-12 NOTE — Anesthesia Preprocedure Evaluation (Signed)
Anesthesia Evaluation  Patient identified by MRN, date of birth, ID band Patient confused  General Assessment Comment:Oriented to person, follows commands appropriately but slowly.  Reviewed: Allergy & Precautions, NPO status , Patient's Chart, lab work & pertinent test results  Airway Mallampati: I  TM Distance: >3 FB Neck ROM: Full    Dental  (+) Edentulous Upper, Edentulous Lower   Pulmonary neg pulmonary ROS,    breath sounds clear to auscultation       Cardiovascular hypertension, Pt. on medications  Rhythm:Regular Rate:Normal     Neuro/Psych PSYCHIATRIC DISORDERS Anxiety CVA    GI/Hepatic GERD  Medicated,  Endo/Other  diabetes, Type 2Hypothyroidism   Renal/GU      Musculoskeletal  (+) Arthritis ,   Abdominal   Peds  Hematology   Anesthesia Other Findings   Reproductive/Obstetrics                             Anesthesia Physical Anesthesia Plan  ASA: III  Anesthesia Plan: Spinal   Post-op Pain Management:    Induction: Intravenous  PONV Risk Score and Plan:   Airway Management Planned: Simple Face Mask  Additional Equipment:   Intra-op Plan:   Post-operative Plan:   Informed Consent: I have reviewed the patients History and Physical, chart, labs and discussed the procedure including the risks, benefits and alternatives for the proposed anesthesia with the patient or authorized representative who has indicated his/her understanding and acceptance.     Plan Discussed with: CRNA and Surgeon  Anesthesia Plan Comments: (Possible GOT if unable to perform SAB.)        Anesthesia Quick Evaluation

## 2017-02-12 NOTE — Transfer of Care (Signed)
Immediate Anesthesia Transfer of Care Note  Patient: Alexis Reese  Procedure(s) Performed: OPEN REDUCTION INTERNAL FIXATION (ORIF) RIGHT ANKLE FRACTURE (Right Ankle)  Patient Location: PACU  Anesthesia Type:Spinal  Level of Consciousness: awake and confused  Airway & Oxygen Therapy: Patient Spontanous Breathing and Patient connected to nasal cannula oxygen  Post-op Assessment: Report given to RN and Post -op Vital signs reviewed and stable  Post vital signs: Reviewed and stable  Last Vitals:  Vitals:   02/12/17 0920 02/12/17 0946  BP: 108/62 (!) 95/53  Pulse:    Resp: 11 19  Temp:    SpO2: 94% 95%    Last Pain:  Vitals:   02/12/17 0442  TempSrc: Oral  PainSc:          Complications: No apparent anesthesia complications; patient remains oriented to self only

## 2017-02-12 NOTE — Anesthesia Procedure Notes (Signed)
Spinal  Patient location during procedure: OR Start time: 02/12/2017 10:19 AM Staffing Resident/CRNA: Mickel Baas, CRNA Other anesthesia staff: Charmaine Downs, CRNA Preanesthetic Checklist Completed: patient identified, site marked, surgical consent, pre-op evaluation, timeout performed, IV checked, risks and benefits discussed and monitors and equipment checked Spinal Block Patient position: right lateral decubitus Prep: Betadine Patient monitoring: heart rate, cardiac monitor, continuous pulse ox and blood pressure Approach: right paramedian Location: L3-4 Injection technique: single-shot Needle Needle type: Spinocan  Needle gauge: 22 G Needle length: 9 cm Assessment Sensory level: T8 Additional Notes ATTEMPTS:2 attempts by me; Spinal placed on first attempt by B. Idavage, CRNA without difficulty in 1 attempt; VSS throughout [place] DATE:06/04/2019

## 2017-02-12 NOTE — Addendum Note (Signed)
Addendum  created 02/12/17 1442 by Mickel Baas, CRNA   Sign clinical note

## 2017-02-12 NOTE — Progress Notes (Signed)
PROGRESS NOTE  Alexis Reese QPY:[id number] DOB: 06-26-1934 DOA: 02/11/2017 PCP: No primary care provider on file.  Brief History:  82 year old female with a history of dementia, diabetes mellitus type 2, hypertension, stroke, hyperlipidemia, depression presenting after mechanical fall at her skilled nursing facility.  Patient is unable to provide any significant history secondary to her cognitive impairment.  There was no loss of consciousness.  The patient did not hit her head.  X-rays of her right ankle revealed a closed trimalleolar fracture with subluxation.  Orthopedics was consulted.  Dr. Aline Brochure to the patient the surgery and performed ORIF of her trimalleolar fracture.  Postoperatively, the patient became agitated and exhibited delirium.  Patient was started on as needed Haldol.  Assessment/Plan: Right ankle trimalleolar fracture -Appreciate orthopedics -02/12/17--Status post ORIF right ankle fracture -PT eval -pain control  Acute Metabolic Encephalopathy -likely due to opioids and hospital delirium -UA and urine culture -Haldol prn agitation -TSH -Serum B12  Diabetes mellitus type 2 -02/11/17--Hemoglobin A1c 7.0 -NovoLog sliding scale -Holding tradjenta  Essential hypertension -Holding amlodipine and benazepril secondary to soft blood pressure  Sinus tachycardia -Secondary to acute medical condition, pain, and a degree of volume depletion -Monitor clinically -Continue IV fluids -Personally reviewed EKG--sinus rhythm, nonspecific T wave changes  Thrombocytopenia -Suspect related to medications -Serum B12 -TSH  Dementia with behavioral disturbance -Continue Depakote, Seroquel  -haldol prn agitation  Hyperlipidemia -Continue statin  History of stroke -Continue aspirin and statin  Hypothyroidism -Continue Synthroid -Check TSH     Disposition Plan:   Home in 1-2 days  Family Communication:  No Family at bedside--Total time spent 35 minutes.   Greater than 50% spent face to face counseling and coordinating care.   Consultants:    Code Status:  FULL / DNR  DVT Prophylaxis:  [place] Heparin / [place] Lovenox   Procedures: As Listed in Progress Note Above  Antibiotics: Peri-op cefazolin    Subjective: Patient is pleasantly confused.  She only intermittently answers questions.  Is not following commands.  No vomiting, respiratory distress, diarrhea, abdominal pain.  Remainder review of systems unobtainable  Objective: Vitals:   02/12/17 1300 02/12/17 1315 02/12/17 1334 02/12/17 1500  BP: (!) 134/56 122/62  (!) 114/52  Pulse: (!) 104 (!) 104  (!) 104  Resp: 17 17  18   Temp:    98.6 F (37 C)  TempSrc:    Oral  SpO2: 98% 100% 98% 90%  Weight:      Height:        Intake/Output Summary (Last 24 hours) at 02/12/2017 1724 Last data filed at 02/12/2017 1500 Gross per 24 hour  Intake 2522.5 ml  Output 725 ml  Net 1797.5 ml   Weight change:  Exam:   General:  Pt is alert, follows commands appropriately, not in acute distress  HEENT: No icterus, No thrush, No neck mass, [place]/AT  Cardiovascular: RRR, S1/S2, no rubs, no gallops  Respiratory: Bibasilar rales.  Good air movement.  Abdomen: Soft/+BS, non tender, non distended, no guarding  Extremities: 1 + LLE edema, No lymphangitis, No petechiae, No rashes, no synovitis; right lower extremity in bulky dressing   Data Reviewed: I have personally reviewed following labs and imaging studies Basic Metabolic Panel: Recent Labs  Lab 02/11/17 2035  NA 138  K 4.6  CL 97*  CO2 31  GLUCOSE 249*  BUN 20  CREATININE 0.91  CALCIUM 9.5   Liver Function Tests: No results for input(s):  AST, ALT, ALKPHOS, BILITOT, PROT, ALBUMIN in the last 168 hours. No results for input(s): LIPASE, AMYLASE in the last 168 hours. No results for input(s): AMMONIA in the last 168 hours. Coagulation Profile: No results for input(s): INR, PROTIME in the last 168 hours. CBC: Recent Labs  Lab  02/11/17 2035 02/12/17 1455  WBC 9.5 8.4  NEUTROABS 7.8*  --   HGB 14.0 12.1  HCT 42.2 37.0  MCV 94.2 95.9  PLT 141* 131*   Cardiac Enzymes: No results for input(s): CKTOTAL, CKMB, CKMBINDEX, TROPONINI in the last 168 hours. BNP: Invalid input(s): POCBNP CBG: Recent Labs  Lab 02/12/17 0733 02/12/17 0853 02/12/17 1652  GLUCAP 153* 150* 180*   HbA1C: Recent Labs    02/11/17 2035  HGBA1C 7.0*   Urine analysis:    Component Value Date/Time   COLORURINE YELLOW 05/17/2013 2305   APPEARANCEUR CLEAR 05/17/2013 2305   LABSPEC 1.015 05/17/2013 2305   PHURINE 6.5 05/17/2013 2305   GLUCOSEU NEGATIVE 05/17/2013 2305   HGBUR NEGATIVE 05/17/2013 2305   BILIRUBINUR NEGATIVE 05/17/2013 2305   KETONESUR NEGATIVE 05/17/2013 2305   PROTEINUR NEGATIVE 05/17/2013 2305   UROBILINOGEN 1.0 05/17/2013 2305   NITRITE NEGATIVE 05/17/2013 2305   LEUKOCYTESUR NEGATIVE 05/17/2013 2305   Sepsis Labs: @LABRCNTIP (procalcitonin:4,lacticidven:4) ) Recent Results (from the past 240 hour(s))  MRSA PCR Screening     Status: None   Collection Time: 02/12/17 12:18 AM  Result Value Ref Range Status   MRSA by PCR NEGATIVE NEGATIVE Final    Comment:        The GeneXpert MRSA Assay (FDA approved for NASAL specimens only), is one component of a comprehensive MRSA colonization surveillance program. It is not intended to diagnose MRSA infection nor to guide or monitor treatment for MRSA infections.   Surgical PCR screen     Status: None   Collection Time: 02/12/17 12:18 AM  Result Value Ref Range Status   MRSA, PCR NEGATIVE NEGATIVE Final   Staphylococcus aureus NEGATIVE NEGATIVE Final    Comment: (NOTE) The Xpert SA Assay (FDA approved for NASAL specimens in patients 22 years of age and older), is one component of a comprehensive surveillance program. It is not intended to diagnose infection nor to guide or monitor treatment.      Scheduled Meds: . amLODipine  2.5 mg Oral Daily  .  aspirin EC  81 mg Oral Daily  . atorvastatin  10 mg Oral Daily  . benazepril  20 mg Oral BID  . calcitonin (salmon)  1 spray Alternating Nares Daily  . divalproex  250 mg Oral BID  . [START ON 02/13/2017] enoxaparin (LOVENOX) injection  30 mg Subcutaneous Q24H  . insulin aspart  0-9 Units Subcutaneous TID WC  . latanoprost  1 drop Both Eyes QHS  . levothyroxine  75 mcg Oral QAC breakfast  . linagliptin  5 mg Oral Daily  . loratadine  10 mg Oral Daily  . Melatonin  1 tablet Oral QHS  . mirabegron ER  25 mg Oral Daily  . pantoprazole  40 mg Oral Daily  . polyvinyl alcohol  2 drop Both Eyes Daily  . QUEtiapine  25 mg Oral TID  . saccharomyces boulardii  250 mg Oral q morning - 10a  . senna-docusate  2 tablet Oral BID  . sertraline  50 mg Oral Daily  . Vitamin D (Ergocalciferol)  50,000 Units Oral Q30 days   Continuous Infusions: . sodium chloride 75 mL/hr at 02/11/17 2118  .  ceFAZolin (ANCEF)  IV      Procedures/Studies: Dg Chest 2 View  Result Date: 02/11/2017 CLINICAL DATA:  Preoperative screening. Lower leg fracture after a fall today. History of diabetes, hypertension, gastroesophageal reflux disease, stroke, cancer. EXAM: CHEST  2 VIEW COMPARISON:  05/17/2013 FINDINGS: Shallow inspiration with elevation of the right hemidiaphragm. Heart size and pulmonary vascularity are normal. No airspace disease or consolidation in the lungs. No blunting of costophrenic angles. No pneumothorax. Surgical clips in the right axilla. Compression of midthoracic and upper lumbar vertebrae, as seen on previous chest CT from 05/20/2013 IMPRESSION: No active cardiopulmonary disease. Electronically Signed   By: Lucienne Capers M.D.   On: 02/11/2017 22:46   Dg Tibia/fibula Right  Result Date: 02/11/2017 CLINICAL DATA:  Patient status post fall. EXAM: RIGHT TIBIA AND FIBULA - 2 VIEW COMPARISON:  None. FINDINGS: Comminuted oblique fracture through the distal fibula. Comminuted medial malleolus fracture.  Dislocation of the talus. Overlying soft tissue swelling about the ankle. No evidence for acute fracture of the proximal tibia or fibula. Knee joint degenerative changes. IMPRESSION: See dedicated ankle radiographs for description of distal fibula fracture, medial malleolus fracture and talar dislocation. No evidence for proximal tibial or fibular fracture. Electronically Signed   By: Lovey Newcomer M.D.   On: 02/11/2017 20:14   Dg Ankle Complete Right  Result Date: 02/12/2017 CLINICAL DATA:  Trimalleolar right ankle fracture EXAM: RIGHT ANKLE - COMPLETE 3+ VIEW COMPARISON:  02/11/2017 FLUOROSCOPY TIME:  Radiation Exposure Index (as provided by the fluoroscopic device): 2.05 mGy If the device does not provide the exposure index: Fluoroscopy Time:  30 seconds Number of Acquired Images:  10 FINDINGS: Initial images again demonstrate the trimalleolar fracture which has been significantly reduced. Fixation sideplate is subsequently noted along the distal fibula with the fracture fragments in anatomic alignment. Fixation screw and wire are noted traversing the medial malleolus with the fracture fragments in anatomic alignment. IMPRESSION: Status post ORIF of right ankle fracture Electronically Signed   By: Inez Catalina M.D.   On: 02/12/2017 12:15   Dg Ankle Complete Right  Result Date: 02/11/2017 CLINICAL DATA:  Patient fell and presents with right ankle pain. EXAM: RIGHT ANKLE - COMPLETE 3+ VIEW COMPARISON:  None. FINDINGS: Acute, closed, trimalleolar fracture-subluxation of the right ankle is noted. A transverse fracture through the medial malleolus, coronal fracture through posterior malleolus and comminuted coronal oblique fracture of the distal diaphysis through metadiaphysis of the fibula with extension into the distal tibiofibular syndesmosis is noted. There is widening of the medial clear space of the ankle mortise by 13 mm. There is marked soft tissue swelling about the malleoli. The subtalar and midfoot  articulations appear congruent. No calcaneal fracture. Intact base of fifth metatarsal. IMPRESSION: Acute, closed, trimalleolar fracture - subluxation of the right ankle as above described with associated marked soft tissue swelling. Widening of the medial clear space of the ankle mortise by 13 mm due to the fracture-subluxation. Electronically Signed   By: Ashley Royalty M.D.   On: 02/11/2017 20:15   Dg C-arm 1-60 Min-no Report  Result Date: 02/12/2017 Fluoroscopy was utilized by the requesting physician.  No radiographic interpretation.   Dg Hip Unilat W Or Wo Pelvis 2-3 Views Right  Result Date: 02/11/2017 CLINICAL DATA:  Fall.  Pain. EXAM: DG HIP (WITH OR WITHOUT PELVIS) 2-3V RIGHT COMPARISON:  None. FINDINGS: There is no evidence of hip fracture or dislocation. There is no evidence of arthropathy or other focal bone abnormality. IMPRESSION: Negative. Electronically Signed  By: Misty Stanley M.D.   On: 02/11/2017 20:14    Mclain Freer, DO  [facility] Pager [phone]  If 7PM-7AM, please contact night-coverage [email or web address] Password [email or web address] 02/12/2017, 5:24 PM   LOS: 1 day

## 2017-02-12 NOTE — H&P (View-Only) (Signed)
Reason for Consult: fracture right ankle Referring Physician: Dr D Tat  Alexis Reese is an 82 y.o. female.  HPI: 82 year old female with dementia from 1 of the local nursing homes fell fractured her right ankle with a date of injury of February 11, 2017.  According to the ER she did complain of severe throbbing aching nonradiating right ankle pain which was worse with movement and prevented her from weightbearing  Past Medical History:  Diagnosis Date  . Anxiety   . Anxiety   . Cancer ([place])   . Diabetes mellitus   . GERD (gastroesophageal reflux disease)   . Hypertension   . Stroke [facility])    TIA  . Thyroid disease     Past Surgical History:  Procedure Laterality Date  . CHOLECYSTECTOMY    . MASTECTOMY    LUMBAR [place] FUSION   Family History  Family history unknown: Yes    Social History:  reports that  has never smoked. she has never used smokeless tobacco. She reports that she does not drink alcohol or use drugs.  Allergies:  Allergies  Allergen Reactions  . Codeine   . Influenza Vac Split Quad   . Morphine And Related   . Tuberculin Tests     Medications:  Current Facility-Administered Medications:  .  0.9 %  sodium chloride infusion, , Intravenous, Continuous, Fredia Sorrow, MD, Last Rate: 75 mL/hr at 02/11/17 2118 .  acetaminophen (TYLENOL) tablet 650 mg, 650 mg, Oral, Q6H PRN **OR** acetaminophen (TYLENOL) suppository 650 mg, 650 mg, Rectal, Q6H PRN, Jani Gravel, MD .  amLODipine (NORVASC) tablet 2.5 mg, 2.5 mg, Oral, Daily, Jani Gravel, MD .  aspirin EC tablet 81 mg, 81 mg, Oral, Daily, Jani Gravel, MD .  atorvastatin (LIPITOR) tablet 10 mg, 10 mg, Oral, Daily, Jani Gravel, MD .  benazepril (LOTENSIN) tablet 20 mg, 20 mg, Oral, BID, Jani Gravel, MD .  calcitonin (salmon) (MIACALCIN/FORTICAL) nasal spray 1 spray, 1 spray, Alternating Nares, Daily, Jani Gravel, MD .  ceFAZolin (ANCEF) IVPB 2g/100 mL premix, 2 g, Intravenous, On Call to [place], Carole Civil, MD .   clonazePAM [facility] [place]) tablet 0.5 mg, 0.5 mg, Oral, TID PRN, Jani Gravel, MD .  diphenhydrAMINE (BENADRYL) injection 25 mg, 25 mg, Intravenous, Q6H PRN, Jani Gravel, MD .  divalproex (DEPAKOTE) DR tablet 250 mg, 250 mg, Oral, BID, Jani Gravel, MD .  enoxaparin (LOVENOX) injection 40 mg, 40 mg, Subcutaneous, Q24H, Jani Gravel, MD .  HYDROmorphone (DILAUDID) injection 1 mg, 1 mg, Intravenous, Q4H PRN, Jani Gravel, MD, 1 mg at 02/12/17 0440 .  insulin aspart (novoLOG) injection 0-9 Units, 0-9 Units, Subcutaneous, TID WC, Jani Gravel, MD .  latanoprost (XALATAN) 0.005 % ophthalmic solution 1 drop, 1 drop, Both Eyes, QHS, Jani Gravel, MD .  levothyroxine (SYNTHROID, LEVOTHROID) tablet 75 mcg, 75 mcg, Oral, QAC breakfast, Jani Gravel, MD .  linagliptin (TRADJENTA) tablet 5 mg, 5 mg, Oral, Daily, Jani Gravel, MD .  loratadine (CLARITIN) tablet 10 mg, 10 mg, Oral, Daily, Jani Gravel, MD .  Melatonin TABS 3 mg, 1 tablet, Oral, QHS, Jani Gravel, MD .  mirabegron ER [facility]) tablet 25 mg, 25 mg, Oral, Daily, Jani Gravel, MD .  ondansetron [facility]) injection 4 mg, 4 mg, Intravenous, Q6H PRN, Jani Gravel, MD .  pantoprazole (PROTONIX) EC tablet 40 mg, 40 mg, Oral, Daily, Jani Gravel, MD .  polyvinyl alcohol (LIQUIFILM TEARS) 1.4 % ophthalmic solution 2 drop, 2 drop, Both Eyes, Daily, Tat, Shanon Brow, MD .  povidone-iodine  10 % swab 2 application, 2 application, Topical, Once, Carole Civil, MD .  QUEtiapine (SEROQUEL) tablet 25 mg, 25 mg, Oral, TID, Jani Gravel, MD .  saccharomyces boulardii (FLORASTOR) capsule 250 mg, 250 mg, Oral, q morning - 10a, Jani Gravel, MD .  senna-docusate (Senokot-S) tablet 2 tablet, 2 tablet, Oral, BID, Jani Gravel, MD .  sertraline (ZOLOFT) tablet 50 mg, 50 mg, Oral, Daily, Jani Gravel, MD .  Vitamin D (Ergocalciferol) (DRISDOL) capsule 50,000 Units, 50,000 Units, Oral, Q30 days, Jani Gravel, MD   Results for orders placed or performed during the hospital encounter of 02/11/17 (from the past  48 hour(s))  CBC with Differential/Platelet     Status: Abnormal   Collection Time: 02/11/17  8:35 PM  Result Value Ref Range   WBC 9.5 4.0 - 10.5 K/uL   RBC 4.48 3.87 - 5.11 MIL/uL   Hemoglobin 14.0 12.0 - 15.0 g/dL   HCT 42.2 36.0 - 46.0 %   MCV 94.2 78.0 - 100.0 fL   MCH 31.3 26.0 - 34.0 pg   MCHC 33.2 30.0 - 36.0 g/dL   RDW 12.4 11.5 - 15.5 %   Platelets 141 (L) 150 - 400 K/uL   Neutrophils Relative % 82 %   Neutro Abs 7.8 (H) 1.7 - 7.7 K/uL   Lymphocytes Relative 12 %   Lymphs Abs 1.1 0.7 - 4.0 K/uL   Monocytes Relative 5 %   Monocytes Absolute 0.5 0.1 - 1.0 K/uL   Eosinophils Relative 1 %   Eosinophils Absolute 0.1 0.0 - 0.7 K/uL   Basophils Relative 0 %   Basophils Absolute 0.0 0.0 - 0.1 K/uL  Basic metabolic panel     Status: Abnormal   Collection Time: 02/11/17  8:35 PM  Result Value Ref Range   Sodium 138 135 - 145 mmol/L   Potassium 4.6 3.5 - 5.1 mmol/L   Chloride 97 (L) 101 - 111 mmol/L   CO2 31 22 - 32 mmol/L   Glucose, Bld 249 (H) 65 - 99 mg/dL   BUN 20 6 - 20 mg/dL   Creatinine, Ser 0.91 0.44 - 1.00 mg/dL   Calcium 9.5 8.9 - 10.3 mg/dL   GFR calc non Af Amer 58 (L) >60 mL/min   GFR calc Af Amer >60 >60 mL/min    Comment: (NOTE) The eGFR has been calculated using the CKD EPI equation. This calculation has not been validated in all clinical situations. eGFR's persistently <60 mL/min signify possible Chronic Kidney Disease.    Anion gap 10 5 - 15  MRSA PCR Screening     Status: None   Collection Time: 02/12/17 12:18 AM  Result Value Ref Range   MRSA by PCR NEGATIVE NEGATIVE    Comment:        The GeneXpert MRSA Assay (FDA approved for NASAL specimens only), is one component of a comprehensive MRSA colonization surveillance program. It is not intended to diagnose MRSA infection nor to guide or monitor treatment for MRSA infections.   Surgical PCR screen     Status: None   Collection Time: 02/12/17 12:18 AM  Result Value Ref Range   MRSA, PCR  NEGATIVE NEGATIVE   Staphylococcus aureus NEGATIVE NEGATIVE    Comment: (NOTE) The Xpert SA Assay (FDA approved for NASAL specimens in patients 76 years of age and older), is one component of a comprehensive surveillance program. It is not intended to diagnose infection nor to guide or monitor treatment.   Glucose, capillary  Status: Abnormal   Collection Time: 02/12/17  7:33 AM  Result Value Ref Range   Glucose-Capillary 153 (H) 65 - 99 mg/dL    Dg Chest 2 View  Result Date: 02/11/2017 CLINICAL DATA:  Preoperative screening. Lower leg fracture after a fall today. History of diabetes, hypertension, gastroesophageal reflux disease, stroke, cancer. EXAM: CHEST  2 VIEW COMPARISON:  05/17/2013 FINDINGS: Shallow inspiration with elevation of the right hemidiaphragm. Heart size and pulmonary vascularity are normal. No airspace disease or consolidation in the lungs. No blunting of costophrenic angles. No pneumothorax. Surgical clips in the right axilla. Compression of midthoracic and upper lumbar vertebrae, as seen on previous chest CT from 05/20/2013 IMPRESSION: No active cardiopulmonary disease. Electronically Signed   By: Lucienne Capers M.D.   On: 02/11/2017 22:46   Dg Tibia/fibula Right  Result Date: 02/11/2017 CLINICAL DATA:  Patient status post fall. EXAM: RIGHT TIBIA AND FIBULA - 2 VIEW COMPARISON:  None. FINDINGS: Comminuted oblique fracture through the distal fibula. Comminuted medial malleolus fracture. Dislocation of the talus. Overlying soft tissue swelling about the ankle. No evidence for acute fracture of the proximal tibia or fibula. Knee joint degenerative changes. IMPRESSION: See dedicated ankle radiographs for description of distal fibula fracture, medial malleolus fracture and talar dislocation. No evidence for proximal tibial or fibular fracture. Electronically Signed   By: Lovey Newcomer M.D.   On: 02/11/2017 20:14   Dg Ankle Complete Right  Result Date: 02/11/2017 CLINICAL  DATA:  Patient fell and presents with right ankle pain. EXAM: RIGHT ANKLE - COMPLETE 3+ VIEW COMPARISON:  None. FINDINGS: Acute, closed, trimalleolar fracture-subluxation of the right ankle is noted. A transverse fracture through the medial malleolus, coronal fracture through posterior malleolus and comminuted coronal oblique fracture of the distal diaphysis through metadiaphysis of the fibula with extension into the distal tibiofibular syndesmosis is noted. There is widening of the medial clear space of the ankle mortise by 13 mm. There is marked soft tissue swelling about the malleoli. The subtalar and midfoot articulations appear congruent. No calcaneal fracture. Intact base of fifth metatarsal. IMPRESSION: Acute, closed, trimalleolar fracture - subluxation of the right ankle as above described with associated marked soft tissue swelling. Widening of the medial clear space of the ankle mortise by 13 mm due to the fracture-subluxation. Electronically Signed   By: Ashley Royalty M.D.   On: 02/11/2017 20:15   Dg Hip Unilat W Or Wo Pelvis 2-3 Views Right  Result Date: 02/11/2017 CLINICAL DATA:  Fall.  Pain. EXAM: DG HIP (WITH OR WITHOUT PELVIS) 2-3V RIGHT COMPARISON:  None. FINDINGS: There is no evidence of hip fracture or dislocation. There is no evidence of arthropathy or other focal bone abnormality. IMPRESSION: Negative. Electronically Signed   By: Misty  M.D.   On: 02/11/2017 20:14    Review of Systems  Unable to perform ROS: Dementia   Blood pressure 130/70, pulse 87, temperature 98.7 F (37.1 C), temperature source Oral, resp. rate 20, height _0  (1.6 m), weight 180 lb (81.6 kg), SpO2 95 %. Physical Exam  Constitutional: She appears well-developed and well-nourished. No distress.  HENT:  Head: Normocephalic and atraumatic.  Right Ear: External ear normal.  Left Ear: External ear normal.  Mouth/Throat: No oropharyngeal exudate.  Eyes: Conjunctivae and EOM are normal. Right eye exhibits  no discharge. Left eye exhibits no discharge. No scleral icterus.  Neck: Neck supple. No JVD present. No tracheal deviation present. No thyromegaly present.  Cardiovascular: Normal rate, regular rhythm and intact distal  pulses.  Respiratory: Effort normal. No stridor. No respiratory distress. She has no wheezes. She exhibits no tenderness.  GI: Soft. She exhibits no distension and no mass. There is no tenderness. There is no guarding.  Musculoskeletal:       Left hip: Normal.       Right knee: Normal.       Left knee: Normal.       Right ankle: She exhibits decreased range of motion and swelling.       Feet:  Right upper extremity left upper extremity left lower extremity: Normal range of motion all joints stable reduced without subluxation muscle tone normal normal alignment neurovascular exam intact skin normal lymph nodes negative  Lymphadenopathy:    She has no cervical adenopathy.  Neurological: She is alert. She is disoriented. She displays no atrophy, no tremor and normal reflexes. No cranial nerve deficit or sensory deficit. She exhibits normal muscle tone. She displays no seizure activity. Gait abnormal. Coordination normal.  She is oriented to place but not time person but not time question if she is oriented to place  Skin: Skin is warm and dry. No rash noted. She is not diaphoretic. No erythema. No pallor.  Psychiatric: Her behavior is normal.    Assessment/Plan: Review of all x-rays and imaging studies indicate patient has a trimalleolar fracture of the right ankle with small posterior malleolar fragment.  She is diabetic and with dementia she is high risk for postoperative complications as she will have difficulty maintaining nonweightbearing status.  Plan open treatment internal fixation right ankle  The procedure has been fully reviewed with the patient; The risks and benefits of surgery have been discussed and explained and understood. Alternative treatment has also been  reviewed, questions were encouraged and answered. The postoperative plan is also been reviewed.   Arther Abbott 02/12/2017, 7:58 AM    [id number] Guardian CAROLYN   BMP Latest Ref Rng & Units 02/11/2017 06/12/2015 05/17/2013  Glucose 65 - 99 mg/dL 249(H) - 183(H)  BUN 6 - 20 mg/dL 20 - 6  Creatinine 0.44 - 1.00 mg/dL 0.91 0.90 0.77  Sodium 135 - 145 mmol/L 138 - 134(L)  Potassium 3.5 - 5.1 mmol/L 4.6 - 4.2  Chloride 101 - 111 mmol/L 97(L) - 94(L)  CO2 22 - 32 mmol/L 31 - 28  Calcium 8.9 - 10.3 mg/dL 9.5 - 9.4

## 2017-02-12 NOTE — Interval H&P Note (Signed)
History and Physical Interval Note:  02/12/2017 9:46 AM  Alexis Reese  has presented today for surgery, with the diagnosis of right ankle fracture  The various methods of treatment have been discussed with the patient and family. After consideration of risks, benefits and other options for treatment, the patient has consented to  Procedure(s) with comments: OPEN REDUCTION INTERNAL FIXATION (ORIF) ANKLE FRACTURE (Right) - I CAN GO AT 930 as a surgical intervention .  The patient's history has been reviewed, patient examined, no change in status, stable for surgery.  I have reviewed the patient's chart and labs.  Questions were answered to the patient's satisfaction.     Arther Abbott

## 2017-02-12 NOTE — Anesthesia Postprocedure Evaluation (Signed)
Anesthesia Post Note  Patient: Alexis Reese  Procedure(s) Performed: OPEN REDUCTION INTERNAL FIXATION (ORIF) RIGHT ANKLE FRACTURE (Right Ankle)  Patient location during evaluation: Nursing Unit Anesthesia Type: Spinal Level of consciousness: awake and alert, patient cooperative and confused (Still only oriented to self) Pain management: pain level controlled Vital Signs Assessment: post-procedure vital signs reviewed and stable Respiratory status: spontaneous breathing Cardiovascular status: stable Postop Assessment: no apparent nausea or vomiting, spinal receding and no headache Anesthetic complications: no     Last Vitals:  Vitals:   02/12/17 1300 02/12/17 1315  BP: (!) 134/56 122/62  Pulse: (!) 104 (!) 104  Resp: 17 17  Temp:    SpO2: 98% 100%    Last Pain:  Vitals:   02/12/17 1245  TempSrc:   PainSc: Asleep                 Maclovio Henson A

## 2017-02-12 NOTE — Progress Notes (Signed)
Pt refused lab work this am. Patient has not urinated into purewick. Bladder scan showed less than 300cc. Will continue to monitor patient.

## 2017-02-12 NOTE — Anesthesia Procedure Notes (Signed)
Procedure Name: MAC Date/Time: 02/12/2017 9:51 AM Performed by: Andree Elk Amy A, CRNA Pre-anesthesia Checklist: Patient identified, Timeout performed, Emergency Drugs available, Suction available and Patient being monitored Oxygen Delivery Method: Simple face mask

## 2017-02-12 NOTE — Consult Note (Addendum)
Reason for Consult: fracture right ankle Referring Physician: Dr D Tat  Alexis Reese is an 82 y.o. female.  HPI: 82-year-old female with dementia from 1 of the local [facility] homes fell fractured her right ankle with a date of injury of February 11, 2017.  According to the ER she did complain of severe throbbing aching nonradiating right ankle pain which was worse with movement and prevented her from weightbearing  Past Medical History:  Diagnosis Date  . Anxiety   . Anxiety   . Cancer (HCC)   . Diabetes mellitus   . GERD (gastroesophageal reflux disease)   . Hypertension   . Stroke (HCC)    TIA  . Thyroid disease     Past Surgical History:  Procedure Laterality Date  . CHOLECYSTECTOMY    . MASTECTOMY    LUMBAR DISC FUSION   Family History  Family history unknown: Yes    Social History:  reports that  has never smoked. she has never used smokeless tobacco. She reports that she does not drink alcohol or use drugs.  Allergies:  Allergies  Allergen Reactions  . Codeine   . Influenza Vac Split Quad   . Morphine And Related   . Tuberculin Tests     Medications:  Current Facility-Administered Medications:  .  0.9 %  sodium chloride infusion, , Intravenous, Continuous, Zackowski, Scott, MD, Last Rate: 75 mL/hr at 02/11/17 2118 .  acetaminophen (TYLENOL) tablet 650 mg, 650 mg, Oral, Q6H PRN **OR** acetaminophen (TYLENOL) suppository 650 mg, 650 mg, Rectal, Q6H PRN, Kim, James, MD .  amLODipine (NORVASC) tablet 2.5 mg, 2.5 mg, Oral, Daily, Kim, James, MD .  aspirin EC tablet 81 mg, 81 mg, Oral, Daily, Kim, James, MD .  atorvastatin (LIPITOR) tablet 10 mg, 10 mg, Oral, Daily, Kim, James, MD .  benazepril (LOTENSIN) tablet 20 mg, 20 mg, Oral, BID, Kim, James, MD .  calcitonin (salmon) (MIACALCIN/FORTICAL) nasal spray 1 spray, 1 spray, Alternating Nares, Daily, Kim, James, MD .  ceFAZolin (ANCEF) IVPB 2g/100 mL premix, 2 g, Intravenous, On Call to OR, Justiss Gerbino E, MD .   clonazePAM (KLONOPIN) tablet 0.5 mg, 0.5 mg, Oral, TID PRN, Kim, James, MD .  diphenhydrAMINE (BENADRYL) injection 25 mg, 25 mg, Intravenous, Q6H PRN, Kim, James, MD .  divalproex (DEPAKOTE) DR tablet 250 mg, 250 mg, Oral, BID, Kim, James, MD .  enoxaparin (LOVENOX) injection 40 mg, 40 mg, Subcutaneous, Q24H, Kim, James, MD .  HYDROmorphone (DILAUDID) injection 1 mg, 1 mg, Intravenous, Q4H PRN, Kim, James, MD, 1 mg at 02/12/17 0440 .  insulin aspart (novoLOG) injection 0-9 Units, 0-9 Units, Subcutaneous, TID WC, Kim, James, MD .  latanoprost (XALATAN) 0.005 % ophthalmic solution 1 drop, 1 drop, Both Eyes, QHS, Kim, James, MD .  levothyroxine (SYNTHROID, LEVOTHROID) tablet 75 mcg, 75 mcg, Oral, QAC breakfast, Kim, James, MD .  linagliptin (TRADJENTA) tablet 5 mg, 5 mg, Oral, Daily, Kim, James, MD .  loratadine (CLARITIN) tablet 10 mg, 10 mg, Oral, Daily, Kim, James, MD .  Melatonin TABS 3 mg, 1 tablet, Oral, QHS, Kim, James, MD .  mirabegron ER (MYRBETRIQ) tablet 25 mg, 25 mg, Oral, Daily, Kim, James, MD .  ondansetron (ZOFRAN) injection 4 mg, 4 mg, Intravenous, Q6H PRN, Kim, James, MD .  pantoprazole (PROTONIX) EC tablet 40 mg, 40 mg, Oral, Daily, Kim, James, MD .  polyvinyl alcohol (LIQUIFILM TEARS) 1.4 % ophthalmic solution 2 drop, 2 drop, Both Eyes, Daily, Tat, David, MD .  povidone-iodine   10 % swab 2 application, 2 application, Topical, Once, Brody Kump E, MD .  QUEtiapine (SEROQUEL) tablet 25 mg, 25 mg, Oral, TID, Kim, James, MD .  saccharomyces boulardii (FLORASTOR) capsule 250 mg, 250 mg, Oral, q morning - 10a, Kim, James, MD .  senna-docusate (Senokot-S) tablet 2 tablet, 2 tablet, Oral, BID, Kim, James, MD .  sertraline (ZOLOFT) tablet 50 mg, 50 mg, Oral, Daily, Kim, James, MD .  Vitamin D (Ergocalciferol) (DRISDOL) capsule 50,000 Units, 50,000 Units, Oral, Q30 days, Kim, James, MD   Results for orders placed or performed during the hospital encounter of 02/11/17 (from the past  48 hour(s))  CBC with Differential/Platelet     Status: Abnormal   Collection Time: 02/11/17  8:35 PM  Result Value Ref Range   WBC 9.5 4.0 - 10.5 K/uL   RBC 4.48 3.87 - 5.11 MIL/uL   Hemoglobin 14.0 12.0 - 15.0 g/dL   HCT 42.2 36.0 - 46.0 %   MCV 94.2 78.0 - 100.0 fL   MCH 31.3 26.0 - 34.0 pg   MCHC 33.2 30.0 - 36.0 g/dL   RDW 12.4 11.5 - 15.5 %   Platelets 141 (L) 150 - 400 K/uL   Neutrophils Relative % 82 %   Neutro Abs 7.8 (H) 1.7 - 7.7 K/uL   Lymphocytes Relative 12 %   Lymphs Abs 1.1 0.7 - 4.0 K/uL   Monocytes Relative 5 %   Monocytes Absolute 0.5 0.1 - 1.0 K/uL   Eosinophils Relative 1 %   Eosinophils Absolute 0.1 0.0 - 0.7 K/uL   Basophils Relative 0 %   Basophils Absolute 0.0 0.0 - 0.1 K/uL  Basic metabolic panel     Status: Abnormal   Collection Time: 02/11/17  8:35 PM  Result Value Ref Range   Sodium 138 135 - 145 mmol/L   Potassium 4.6 3.5 - 5.1 mmol/L   Chloride 97 (L) 101 - 111 mmol/L   CO2 31 22 - 32 mmol/L   Glucose, Bld 249 (H) 65 - 99 mg/dL   BUN 20 6 - 20 mg/dL   Creatinine, Ser 0.91 0.44 - 1.00 mg/dL   Calcium 9.5 8.9 - 10.3 mg/dL   GFR calc non Af Amer 58 (L) >60 mL/min   GFR calc Af Amer >60 >60 mL/min    Comment: (NOTE) The eGFR has been calculated using the CKD EPI equation. This calculation has not been validated in all clinical situations. eGFR's persistently <60 mL/min signify possible Chronic Kidney Disease.    Anion gap 10 5 - 15  MRSA PCR Screening     Status: None   Collection Time: 02/12/17 12:18 AM  Result Value Ref Range   MRSA by PCR NEGATIVE NEGATIVE    Comment:        The GeneXpert MRSA Assay (FDA approved for NASAL specimens only), is one component of a comprehensive MRSA colonization surveillance program. It is not intended to diagnose MRSA infection nor to guide or monitor treatment for MRSA infections.   Surgical PCR screen     Status: None   Collection Time: 02/12/17 12:18 AM  Result Value Ref Range   MRSA, PCR  NEGATIVE NEGATIVE   Staphylococcus aureus NEGATIVE NEGATIVE    Comment: (NOTE) The Xpert SA Assay (FDA approved for NASAL specimens in patients 22 years of age and older), is one component of a comprehensive surveillance program. It is not intended to diagnose infection nor to guide or monitor treatment.   Glucose, capillary       Status: Abnormal   Collection Time: 02/12/17  7:33 AM  Result Value Ref Range   Glucose-Capillary 153 (H) 65 - 99 mg/dL    Dg Chest 2 View  Result Date: 02/11/2017 CLINICAL DATA:  Preoperative screening. Lower leg fracture after a fall today. History of diabetes, hypertension, gastroesophageal reflux disease, stroke, cancer. EXAM: CHEST  2 VIEW COMPARISON:  05/17/2013 FINDINGS: Shallow inspiration with elevation of the right hemidiaphragm. Heart size and pulmonary vascularity are normal. No airspace disease or consolidation in the lungs. No blunting of costophrenic angles. No pneumothorax. Surgical clips in the right axilla. Compression of midthoracic and upper lumbar vertebrae, as seen on previous chest CT from 05/20/2013 IMPRESSION: No active cardiopulmonary disease. Electronically Signed   By: William  Stevens M.D.   On: 02/11/2017 22:46   Dg Tibia/fibula Right  Result Date: 02/11/2017 CLINICAL DATA:  Patient status post fall. EXAM: RIGHT TIBIA AND FIBULA - 2 VIEW COMPARISON:  None. FINDINGS: Comminuted oblique fracture through the distal fibula. Comminuted medial malleolus fracture. Dislocation of the talus. Overlying soft tissue swelling about the ankle. No evidence for acute fracture of the proximal tibia or fibula. Knee joint degenerative changes. IMPRESSION: See dedicated ankle radiographs for description of distal fibula fracture, medial malleolus fracture and talar dislocation. No evidence for proximal tibial or fibular fracture. Electronically Signed   By: Drew  Davis M.D.   On: 02/11/2017 20:14   Dg Ankle Complete Right  Result Date: 02/11/2017 CLINICAL  DATA:  Patient fell and presents with right ankle pain. EXAM: RIGHT ANKLE - COMPLETE 3+ VIEW COMPARISON:  None. FINDINGS: Acute, closed, trimalleolar fracture-subluxation of the right ankle is noted. A transverse fracture through the medial malleolus, coronal fracture through posterior malleolus and comminuted coronal oblique fracture of the distal diaphysis through metadiaphysis of the fibula with extension into the distal tibiofibular syndesmosis is noted. There is widening of the medial clear space of the ankle mortise by 13 mm. There is marked soft tissue swelling about the malleoli. The subtalar and midfoot articulations appear congruent. No calcaneal fracture. Intact base of fifth metatarsal. IMPRESSION: Acute, closed, trimalleolar fracture - subluxation of the right ankle as above described with associated marked soft tissue swelling. Widening of the medial clear space of the ankle mortise by 13 mm due to the fracture-subluxation. Electronically Signed   By: David  Kwon M.D.   On: 02/11/2017 20:15   Dg Hip Unilat W Or Wo Pelvis 2-3 Views Right  Result Date: 02/11/2017 CLINICAL DATA:  Fall.  Pain. EXAM: DG HIP (WITH OR WITHOUT PELVIS) 2-3V RIGHT COMPARISON:  None. FINDINGS: There is no evidence of hip fracture or dislocation. There is no evidence of arthropathy or other focal bone abnormality. IMPRESSION: Negative. Electronically Signed   By: Eric  Mansell M.D.   On: 02/11/2017 20:14    Review of Systems  Unable to perform ROS: Dementia   Blood pressure 130/70, pulse 87, temperature 98.7 F (37.1 C), temperature source Oral, resp. rate 20, height 5' 3" (1.6 m), weight 180 lb (81.6 kg), SpO2 95 %. Physical Exam  Constitutional: She appears well-developed and well-nourished. No distress.  HENT:  Head: Normocephalic and atraumatic.  Right Ear: External ear normal.  Left Ear: External ear normal.  Mouth/Throat: No oropharyngeal exudate.  Eyes: Conjunctivae and EOM are normal. Right eye exhibits  no discharge. Left eye exhibits no discharge. No scleral icterus.  Neck: Neck supple. No JVD present. No tracheal deviation present. No thyromegaly present.  Cardiovascular: Normal rate, regular rhythm and intact distal   pulses.  Respiratory: Effort normal. No stridor. No respiratory distress. She has no wheezes. She exhibits no tenderness.  GI: Soft. She exhibits no distension and no mass. There is no tenderness. There is no guarding.  Musculoskeletal:       Left hip: Normal.       Right knee: Normal.       Left knee: Normal.       Right ankle: She exhibits decreased range of motion and swelling.       Feet:  Right upper extremity left upper extremity left lower extremity: Normal range of motion all joints stable reduced without subluxation muscle tone normal normal alignment neurovascular exam intact skin normal lymph nodes negative  Lymphadenopathy:    She has no cervical adenopathy.  Neurological: She is alert. She is disoriented. She displays no atrophy, no tremor and normal reflexes. No cranial nerve deficit or sensory deficit. She exhibits normal muscle tone. She displays no seizure activity. Gait abnormal. Coordination normal.  She is oriented to place but not time person but not time question if she is oriented to place  Skin: Skin is warm and dry. No rash noted. She is not diaphoretic. No erythema. No pallor.  Psychiatric: Her behavior is normal.    Assessment/Plan: Review of all x-rays and imaging studies indicate patient has a trimalleolar fracture of the right ankle with small posterior malleolar fragment.  She is diabetic and with dementia she is high risk for postoperative complications as she will have difficulty maintaining nonweightbearing status.  Plan open treatment internal fixation right ankle  The procedure has been fully reviewed with the patient; The risks and benefits of surgery have been discussed and explained and understood. Alternative treatment has also been  reviewed, questions were encouraged and answered. The postoperative plan is also been reviewed.   Wilhemina Grall 02/12/2017, 7:58 AM    [id number] Guardian CAROLYN   BMP Latest Ref Rng & Units 02/11/2017 06/12/2015 05/17/2013  Glucose 65 - 99 mg/dL 249(H) - 183(H)  BUN 6 - 20 mg/dL 20 - 6  Creatinine 0.44 - 1.00 mg/dL 0.91 0.90 0.77  Sodium 135 - 145 mmol/L 138 - 134(L)  Potassium 3.5 - 5.1 mmol/L 4.6 - 4.2  Chloride 101 - 111 mmol/L 97(L) - 94(L)  CO2 22 - 32 mmol/L 31 - 28  Calcium 8.9 - 10.3 mg/dL 9.5 - 9.4    

## 2017-02-13 DIAGNOSIS — S82851K Displaced trimalleolar fracture of right lower leg, subsequent encounter for closed fracture with nonunion: Secondary | ICD-10-CM

## 2017-02-13 LAB — URINALYSIS, COMPLETE (UACMP) WITH MICROSCOPIC
Bilirubin Urine: NEGATIVE
Glucose, UA: NEGATIVE mg/dL
Hgb urine dipstick: NEGATIVE
Ketones, ur: 5 mg/dL — AB
LEUKOCYTES UA: NEGATIVE
Nitrite: NEGATIVE
PH: 5 (ref 5.0–8.0)
Protein, ur: NEGATIVE mg/dL
SPECIFIC GRAVITY, URINE: 1.013 (ref 1.005–1.030)
SQUAMOUS EPITHELIAL / LPF: NONE SEEN

## 2017-02-13 LAB — BASIC METABOLIC PANEL
ANION GAP: 7 (ref 5–15)
BUN: 13 mg/dL (ref 6–20)
CALCIUM: 8.2 mg/dL — AB (ref 8.9–10.3)
CO2: 30 mmol/L (ref 22–32)
CREATININE: 0.85 mg/dL (ref 0.44–1.00)
Chloride: 97 mmol/L — ABNORMAL LOW (ref 101–111)
GFR calc Af Amer: >60 mL/min (ref >60)
GLUCOSE: 227 mg/dL — AB (ref 65–99)
Potassium: 4.2 mmol/L (ref 3.5–5.1)
Sodium: 134 mmol/L — ABNORMAL LOW (ref 135–145)

## 2017-02-13 LAB — GLUCOSE, CAPILLARY
GLUCOSE-CAPILLARY: 139 mg/dL — AB (ref 65–99)
GLUCOSE-CAPILLARY: 173 mg/dL — AB (ref 65–99)
GLUCOSE-CAPILLARY: 140 mg/dL — AB (ref 65–99)
Glucose-Capillary: 216 mg/dL — ABNORMAL HIGH (ref 65–99)
Glucose-Capillary: 195 mg/dL — ABNORMAL HIGH (ref 65–99)

## 2017-02-13 LAB — VITAMIN B12: Vitamin B-12: 1005 pg/mL — ABNORMAL HIGH (ref 180–914)

## 2017-02-13 LAB — CBC
HCT: 33.9 % — ABNORMAL LOW (ref 36.0–46.0)
Hemoglobin: 11.3 g/dL — ABNORMAL LOW (ref 12.0–15.0)
MCH: 31.4 pg (ref 26.0–34.0)
MCHC: 33.3 g/dL (ref 30.0–36.0)
MCV: 94.2 fL (ref 78.0–100.0)
PLATELETS: 112 10*3/uL — AB (ref 150–400)
RBC: 3.6 MIL/uL — ABNORMAL LOW (ref 3.87–5.11)
RDW: 12.4 % (ref 11.5–15.5)
WBC: 9.6 10*3/uL (ref 4.0–10.5)

## 2017-02-13 LAB — TSH: TSH: 2.982 u[IU]/mL (ref 0.350–4.500)

## 2017-02-13 MED ORDER — INSULIN ASPART 100 UNIT/ML ~~LOC~~ SOLN
2.0000 [IU] | Freq: Once | SUBCUTANEOUS | Status: AC
  Administered 2017-02-13: 2 [IU] via SUBCUTANEOUS

## 2017-02-13 NOTE — Progress Notes (Signed)
PROGRESS NOTE  Alexis Reese MHD:[id number] DOB: 01-01-1935 DOA: 02/11/2017 PCP: No primary care provider on file.  Brief History:  82 year old female with a history of dementia, diabetes mellitus type 2, hypertension, stroke, hyperlipidemia, depression presenting after mechanical fall at her skilled nursing facility.  Patient is unable to provide any significant history secondary to her cognitive impairment.  There was no loss of consciousness.  The patient did not hit her head.  X-rays of her right ankle revealed a closed trimalleolar fracture with subluxation.  Orthopedics was consulted.  Dr. Aline Brochure to the patient the surgery and performed ORIF of her trimalleolar fracture.  Postoperatively, the patient became agitated and exhibited delirium.  Patient was started on as needed Haldol.  Assessment/Plan: Right ankle trimalleolar fracture -Appreciate orthopedics -02/12/17--Status post ORIF right ankle fracture -PT eval -pain control  Acute Metabolic Encephalopathy -likely due to opioids and hospital delirium -UA and urine culture -Haldol prn agitation -Serum B12--1005 -TSH--2.982 -ammonia  Diabetes mellitus type 2 -02/11/17--Hemoglobin A1c 7.0 -NovoLog sliding scale -Holding tradjenta  Essential hypertension -Holding amlodipine and benazepril secondary to soft blood pressure  Sinus tachycardia -Secondary to acute medical condition, pain, and a degree of volume depletion -Monitor clinically -Continue IV fluids -Personally reviewed EKG--sinus rhythm, nonspecific T wave changes  Thrombocytopenia -Suspect related to medications -worsening due to acute medical illness -Serum B12--1005 -TSH--2.982  Dementia with behavioral disturbance -Continue Depakote, Seroquel  -haldol prn agitation  Hyperlipidemia -Continue statin  History of stroke -Continue aspirin and statin  Hypothyroidism -Continue Synthroid -Check TSH--2.982     Disposition Plan:    Home in 1-2 days  Family Communication:  No Family at bedside--Total time spent 35 minutes.  Greater than 50% spent face to face counseling and coordinating care.   Consultants:  none  Code Status:  FULL / DNR  DVT Prophylaxis:  [place] Heparin / [place] Lovenox   Procedures: As Listed in Progress Note Above  Antibiotics: Peri-op cefazolin      Subjective: Patient is sleepy but she is easily arousable.  She is pleasantly confused.  She intermittently answers questions.  She follows one-step commands.  There is no reports of vomiting, diarrhea, uncontrolled pain, respiratory distress.  Objective: Vitals:   02/12/17 1500 02/12/17 2010 02/13/17 0500 02/13/17 0602  BP: (!) 114/52 (!) 120/49  101/62  Pulse: (!) 104 (!) 108  97  Resp: 18 18  20   Temp: 98.6 F (37 C) 98.9 F (37.2 C)  99.1 F (37.3 C)  TempSrc: Oral Axillary  Axillary  SpO2: 90%     Weight:   77.7 kg (171 lb 6.4 oz)   Height:        Intake/Output Summary (Last 24 hours) at 02/13/2017 1525 Last data filed at 02/13/2017 1100 Gross per 24 hour  Intake 1695 ml  Output 1350 ml  Net 345 ml   Weight change: -3.629 kg () Exam:   General:  Pt is alert, follows commands appropriately, not in acute distress  HEENT: No icterus, No thrush, No neck mass, [place]/AT  Cardiovascular: RRR, S1/S2, no rubs, no gallops  Respiratory: Bibasilar crackles.  Abdomen: Soft/+BS, non tender, non distended, no guarding  Extremities: 1+ LLE edema, No lymphangitis, No petechiae, No rashes, no synovitis; right lower extremity in a bulky dressing   Data Reviewed: I have personally reviewed following labs and imaging studies Basic Metabolic Panel: Recent Labs  Lab 02/11/17 2035 02/13/17 0618  NA 138 134*  K 4.6 4.2  CL 97* 97*  CO2 31 30  GLUCOSE 249* 227*  BUN 20 13  CREATININE 0.91 0.85  CALCIUM 9.5 8.2*   Liver Function Tests: No results for input(s): AST, ALT, ALKPHOS, BILITOT, PROT, ALBUMIN in the last 168  hours. No results for input(s): LIPASE, AMYLASE in the last 168 hours. No results for input(s): AMMONIA in the last 168 hours. Coagulation Profile: No results for input(s): INR, PROTIME in the last 168 hours. CBC: Recent Labs  Lab 02/11/17 2035 02/12/17 1455 02/13/17 0618  WBC 9.5 8.4 9.6  NEUTROABS 7.8*  --   --   HGB 14.0 12.1 11.3*  HCT 42.2 37.0 33.9*  MCV 94.2 95.9 94.2  PLT 141* 131* 112*   Cardiac Enzymes: No results for input(s): CKTOTAL, CKMB, CKMBINDEX, TROPONINI in the last 168 hours. BNP: Invalid input(s): POCBNP CBG: Recent Labs  Lab 02/12/17 1652 02/12/17 2016 02/13/17 0610 02/13/17 0731 02/13/17 1114  GLUCAP 180* 79 216* 195* 139*   HbA1C: Recent Labs    02/11/17 2035  HGBA1C 7.0*   Urine analysis:    Component Value Date/Time   COLORURINE YELLOW 05/17/2013 [place] 05/17/2013 2305   LABSPEC 1.015 05/17/2013 2305   PHURINE 6.5 05/17/2013 2305   GLUCOSEU NEGATIVE 05/17/2013 2305   HGBUR NEGATIVE 05/17/2013 2305   BILIRUBINUR NEGATIVE 05/17/2013 2305   KETONESUR NEGATIVE 05/17/2013 2305   PROTEINUR NEGATIVE 05/17/2013 2305   UROBILINOGEN 1.0 05/17/2013 2305   NITRITE NEGATIVE 05/17/2013 2305   LEUKOCYTESUR NEGATIVE 05/17/2013 2305   Sepsis Labs: @LABRCNTIP (procalcitonin:4,lacticidven:4) ) Recent Results (from the past 240 hour(s))  MRSA PCR Screening     Status: None   Collection Time: 02/12/17 12:18 AM  Result Value Ref Range Status   MRSA by PCR NEGATIVE NEGATIVE Final    Comment:        The GeneXpert MRSA Assay (FDA approved for NASAL specimens only), is one component of a comprehensive MRSA colonization surveillance program. It is not intended to diagnose MRSA infection nor to guide or monitor treatment for MRSA infections.   Surgical PCR screen     Status: None   Collection Time: 02/12/17 12:18 AM  Result Value Ref Range Status   MRSA, PCR NEGATIVE NEGATIVE Final   Staphylococcus aureus NEGATIVE NEGATIVE  Final    Comment: (NOTE) The Xpert SA Assay (FDA approved for NASAL specimens in patients 63 years of age and older), is one component of a comprehensive surveillance program. It is not intended to diagnose infection nor to guide or monitor treatment.      Scheduled Meds: . aspirin EC  81 mg Oral Daily  . atorvastatin  10 mg Oral Daily  . calcitonin (salmon)  1 spray Alternating Nares Daily  . divalproex  250 mg Oral BID  . enoxaparin (LOVENOX) injection  30 mg Subcutaneous Q24H  . insulin aspart  0-9 Units Subcutaneous TID WC  . latanoprost  1 drop Both Eyes QHS  . levothyroxine  75 mcg Oral QAC breakfast  . Melatonin  1 tablet Oral QHS  . mirabegron ER  25 mg Oral Daily  . pantoprazole  40 mg Oral Daily  . polyvinyl alcohol  2 drop Both Eyes Daily  . QUEtiapine  25 mg Oral TID  . senna-docusate  2 tablet Oral BID  . sertraline  50 mg Oral Daily   Continuous Infusions: . sodium chloride 75 mL/hr at 02/13/17 0556    Procedures/Studies: Dg Chest 2 View  Result Date: 02/11/2017 CLINICAL DATA:  Preoperative screening. Lower leg fracture after a fall today. History of diabetes, hypertension, gastroesophageal reflux disease, stroke, cancer. EXAM: CHEST  2 VIEW COMPARISON:  05/17/2013 FINDINGS: Shallow inspiration with elevation of the right hemidiaphragm. Heart size and pulmonary vascularity are normal. No airspace disease or consolidation in the lungs. No blunting of costophrenic angles. No pneumothorax. Surgical clips in the right axilla. Compression of midthoracic and upper lumbar vertebrae, as seen on previous chest CT from 05/20/2013 IMPRESSION: No active cardiopulmonary disease. Electronically Signed   By: Lucienne Capers M.D.   On: 02/11/2017 22:46   Dg Tibia/fibula Right  Result Date: 02/11/2017 CLINICAL DATA:  Patient status post fall. EXAM: RIGHT TIBIA AND FIBULA - 2 VIEW COMPARISON:  None. FINDINGS: Comminuted oblique fracture through the distal fibula. Comminuted medial  malleolus fracture. Dislocation of the talus. Overlying soft tissue swelling about the ankle. No evidence for acute fracture of the proximal tibia or fibula. Knee joint degenerative changes. IMPRESSION: See dedicated ankle radiographs for description of distal fibula fracture, medial malleolus fracture and talar dislocation. No evidence for proximal tibial or fibular fracture. Electronically Signed   By: Lovey Newcomer M.D.   On: 02/11/2017 20:14   Dg Ankle Complete Right  Result Date: 02/12/2017 CLINICAL DATA:  Trimalleolar right ankle fracture EXAM: RIGHT ANKLE - COMPLETE 3+ VIEW COMPARISON:  02/11/2017 FLUOROSCOPY TIME:  Radiation Exposure Index (as provided by the fluoroscopic device): 2.05 mGy If the device does not provide the exposure index: Fluoroscopy Time:  30 seconds Number of Acquired Images:  10 FINDINGS: Initial images again demonstrate the trimalleolar fracture which has been significantly reduced. Fixation sideplate is subsequently noted along the distal fibula with the fracture fragments in anatomic alignment. Fixation screw and wire are noted traversing the medial malleolus with the fracture fragments in anatomic alignment. IMPRESSION: Status post ORIF of right ankle fracture Electronically Signed   By: Inez Catalina M.D.   On: 02/12/2017 12:15   Dg Ankle Complete Right  Result Date: 02/11/2017 CLINICAL DATA:  Patient fell and presents with right ankle pain. EXAM: RIGHT ANKLE - COMPLETE 3+ VIEW COMPARISON:  None. FINDINGS: Acute, closed, trimalleolar fracture-subluxation of the right ankle is noted. A transverse fracture through the medial malleolus, coronal fracture through posterior malleolus and comminuted coronal oblique fracture of the distal diaphysis through metadiaphysis of the fibula with extension into the distal tibiofibular syndesmosis is noted. There is widening of the medial clear space of the ankle mortise by 13 mm. There is marked soft tissue swelling about the malleoli. The  subtalar and midfoot articulations appear congruent. No calcaneal fracture. Intact base of fifth metatarsal. IMPRESSION: Acute, closed, trimalleolar fracture - subluxation of the right ankle as above described with associated marked soft tissue swelling. Widening of the medial clear space of the ankle mortise by 13 mm due to the fracture-subluxation. Electronically Signed   By: Ashley Royalty M.D.   On: 02/11/2017 20:15   Dg C-arm 1-60 Min-no Report  Result Date: 02/12/2017 Fluoroscopy was utilized by the requesting physician.  No radiographic interpretation.   Dg Hip Unilat W Or Wo Pelvis 2-3 Views Right  Result Date: 02/11/2017 CLINICAL DATA:  Fall.  Pain. EXAM: DG HIP (WITH OR WITHOUT PELVIS) 2-3V RIGHT COMPARISON:  None. FINDINGS: There is no evidence of hip fracture or dislocation. There is no evidence of arthropathy or other focal bone abnormality. IMPRESSION: Negative. Electronically Signed   By: Misty Stanley M.D.   On: 02/11/2017 20:14    Jarelly Rinck, DO  [facility] Pager [phone]  If 7PM-7AM, please contact night-coverage [email or web address] Password [email or web address] 02/13/2017, 3:25 PM   LOS: 2 days

## 2017-02-13 NOTE — Addendum Note (Signed)
Addendum  created 02/13/17 1512 by Mickel Baas, CRNA   Sign clinical note

## 2017-02-13 NOTE — Anesthesia Postprocedure Evaluation (Signed)
Anesthesia Post Note  Patient: Alexis Reese  Procedure(s) Performed: OPEN REDUCTION INTERNAL FIXATION (ORIF) RIGHT ANKLE FRACTURE (Right Ankle)  Patient location during evaluation: Nursing Unit Anesthesia Type: Spinal Level of consciousness: awake, patient uncooperative and confused Pain management: pain level controlled (Difficult to assess due to cognitive status but patient does sleep after receiving pain meds) Vital Signs Assessment: post-procedure vital signs reviewed and stable Respiratory status: spontaneous breathing, respiratory function stable and patient connected to nasal cannula oxygen Cardiovascular status: stable Postop Assessment: no apparent nausea or vomiting Anesthetic complications: no     Last Vitals:  Vitals:   02/12/17 2010 02/13/17 0602  BP: (!) 120/49 101/62  Pulse: (!) 108 97  Resp: 18 20  Temp: 37.2 C 37.3 C  SpO2:      Last Pain:  Vitals:   02/13/17 1230  TempSrc:   PainSc: Asleep                 ADAMS, AMY A

## 2017-02-13 NOTE — Progress Notes (Signed)
Patient ID: Alexis Reese, female   DOB: Jul 23, 1934, 82 y.o.   MRN: [id number] Leave foley in due to disorientation to protect skin and prevent bed sore

## 2017-02-13 NOTE — Progress Notes (Signed)
PT Cancellation Note  Patient Details Name: Alexis Reese MRN: [id number] DOB: Nov 29, 1934   Cancelled Treatment:    Reason Eval/Treat Not Completed: Other (comment).  Start PT tomorrow per orders.   Ramond Dial 02/13/2017, 4:10 PM   Mee Hives, PT MS Acute Rehab Dept. Number: [place] [facility] [place]

## 2017-02-13 NOTE — Progress Notes (Signed)
Patient ID: Alexis Reese, female   DOB: 1934-07-09, 82 y.o.   MRN: [id number] POD 1   BP 101/62 (BP Location: Left Arm)   Pulse 97   Temp 99.1 F (37.3 C) (Axillary)   Resp 20   Ht 5\' 3"  (1.6 m)   Wt 171 lb 6.4 oz (77.7 kg)   SpO2 90%   BMI 30.36 kg/m   She has some delirium after surgery.  We gave her some Haldol I believe and that helped calm her down  Splint intact neurovascular exam normal  Continue ice and elevation  Nonweightbearing status secondary to diabetes and comminution of the fracture although stable fixation was obtained.

## 2017-02-13 NOTE — Progress Notes (Signed)
Patient would not allow staff to turn and reposition her through out night.  Patient would not allow staff to check her blood sugar through out night.  Patient given PRN pain medication frequently during night due to patient complaint of pain in right leg/foot/ankle.  Patient has safety sitter due to patient attempting to pull out lines/tubes.

## 2017-02-14 ENCOUNTER — Inpatient Hospital Stay (HOSPITAL_COMMUNITY): Payer: Medicare Other

## 2017-02-14 DIAGNOSIS — F0391 Unspecified dementia with behavioral disturbance: Secondary | ICD-10-CM

## 2017-02-14 DIAGNOSIS — J9601 Acute respiratory failure with hypoxia: Secondary | ICD-10-CM

## 2017-02-14 LAB — URINE CULTURE: CULTURE: NO GROWTH

## 2017-02-14 LAB — CBC
HCT: 32.4 % — ABNORMAL LOW (ref 36.0–46.0)
HEMOGLOBIN: 10.6 g/dL — AB (ref 12.0–15.0)
MCH: 31.2 pg (ref 26.0–34.0)
MCHC: 32.7 g/dL (ref 30.0–36.0)
MCV: 95.3 fL (ref 78.0–100.0)
Platelets: 119 10*3/uL — ABNORMAL LOW (ref 150–400)
RBC: 3.4 MIL/uL — AB (ref 3.87–5.11)
RDW: 12.6 % (ref 11.5–15.5)
WBC: 8.2 10*3/uL (ref 4.0–10.5)

## 2017-02-14 LAB — GLUCOSE, CAPILLARY
Glucose-Capillary: 159 mg/dL — ABNORMAL HIGH (ref 65–99)
Glucose-Capillary: 166 mg/dL — ABNORMAL HIGH (ref 65–99)
Glucose-Capillary: 163 mg/dL — ABNORMAL HIGH (ref 65–99)
Glucose-Capillary: 115 mg/dL — ABNORMAL HIGH (ref 65–99)

## 2017-02-14 LAB — BASIC METABOLIC PANEL
Anion gap: 8 (ref 5–15)
BUN: 12 mg/dL (ref 6–20)
CALCIUM: 8.3 mg/dL — AB (ref 8.9–10.3)
CHLORIDE: 101 mmol/L (ref 101–111)
CO2: 29 mmol/L (ref 22–32)
CREATININE: 0.87 mg/dL (ref 0.44–1.00)
GFR calc Af Amer: >60 mL/min (ref >60)
GLUCOSE: 176 mg/dL — AB (ref 65–99)
POTASSIUM: 3.9 mmol/L (ref 3.5–5.1)
SODIUM: 138 mmol/L (ref 135–145)

## 2017-02-14 LAB — AMMONIA: AMMONIA: 23 umol/L (ref 9–35)

## 2017-02-14 MED ORDER — LORAZEPAM 2 MG/ML IJ SOLN
1.0000 mg | INTRAMUSCULAR | Status: DC | PRN
  Administered 2017-02-14 – 2017-02-16 (×5): 1 mg via INTRAVENOUS
  Filled 2017-02-14 (×5): qty 1

## 2017-02-14 MED ORDER — FUROSEMIDE 10 MG/ML IJ SOLN
40.0000 mg | Freq: Once | INTRAMUSCULAR | Status: AC
  Administered 2017-02-14: 40 mg via INTRAVENOUS
  Filled 2017-02-14: qty 4

## 2017-02-14 MED ORDER — HYDROCODONE-ACETAMINOPHEN 5-325 MG PO TABS
1.0000 | ORAL_TABLET | Freq: Four times a day (QID) | ORAL | Status: DC | PRN
  Administered 2017-02-14 – 2017-02-16 (×5): 1 via ORAL
  Filled 2017-02-14 (×5): qty 1

## 2017-02-14 MED ORDER — ENOXAPARIN SODIUM 40 MG/0.4ML ~~LOC~~ SOLN
40.0000 mg | SUBCUTANEOUS | Status: DC
  Administered 2017-02-15: 40 mg via SUBCUTANEOUS
  Filled 2017-02-14 (×2): qty 0.4

## 2017-02-14 NOTE — Progress Notes (Signed)
PT Cancellation Note  Patient Details Name: Alexis Reese MRN: [id number] DOB: 03-24-1935   Cancelled Treatment:    Reason Eval/Treat Not Completed: Patient's level of consciousness.  Lethargic and cannot awaken.  Try later as time and pt allow.   Ramond Dial 02/14/2017, 2:31 PM   2:32 PM, 02/14/17 Mee Hives, PT, MS Physical Therapist - [place] [phone] (Office)

## 2017-02-14 NOTE — Progress Notes (Signed)
Patient very anxious/agitated attempted to give patient norco, patient spit medication into floor.  Patient pulling on lines and tubes, patient has mittens on and keeps pulling those off at which time she is pinching and hitting at staff members.

## 2017-02-14 NOTE — Progress Notes (Signed)
Patient ID: Alexis Reese, female   DOB: 10-24-1934, 82 y.o.   MRN: [id number]  Pod 2   Right ankle orif   Post op delirium and confusiion  I decrease opioids due to confusion and delirium  CBC Latest Ref Rng & Units 02/14/2017 02/13/2017 02/12/2017  WBC 4.0 - 10.5 K/uL 8.2 9.6 8.4  Hemoglobin 12.0 - 15.0 g/dL 10.6(L) 11.3(L) 12.1  Hematocrit 36.0 - 46.0 % 32.4(L) 33.9(L) 37.0  Platelets 150 - 400 K/uL 119(L) 112(L) 131(L)   BMP Latest Ref Rng & Units 02/14/2017 02/13/2017 02/11/2017  Glucose 65 - 99 mg/dL 176(H) 227(H) 249(H)  BUN 6 - 20 mg/dL 12 13 20   Creatinine 0.44 - 1.00 mg/dL 0.87 0.85 0.91  Sodium 135 - 145 mmol/L 138 134(L) 138  Potassium 3.5 - 5.1 mmol/L 3.9 4.2 4.6  Chloride 101 - 111 mmol/L 101 97(L) 97(L)  CO2 22 - 32 mmol/L 29 30 31   Calcium 8.9 - 10.3 mg/dL 8.3(L) 8.2(L) 9.5   No major electrolyte issues  Rec. Haldol for delirium

## 2017-02-14 NOTE — Progress Notes (Addendum)
PROGRESS NOTE  Alexis Reese TJQ:[id number] DOB: January 02, 1935 DOA: 02/11/2017 PCP: No primary care provider on file.  Brief History: 82 year old female with a history of dementia, diabetes mellitus type 2, hypertension, stroke, hyperlipidemia, depression presenting after mechanical fall at her skilled nursing facility. Patient is unable to provide any significant history secondary to her cognitive impairment. There was no loss of consciousness. The patient did not hit her head. X-rays of her right ankle revealed a closed trimalleolar fracture with subluxation. Orthopedics was consulted. Dr. Aline Brochure to the patient the surgery and performed ORIF of her trimalleolar fracture. Postoperatively, the patient became agitated and exhibited delirium. Patient was started on as needed Haldol.  Assessment/Plan: Right ankle trimalleolar fracture -Appreciate orthopedics -02/12/17--Status post ORIF right ankle fracture -PT eval -pain control  Acute Respiratory Failure with hypoxia -oxygen sat 85% on RA-->98% on 3L -order CXR  Fever -had 101.7 fever 11/10 evening -CXR -blood cultures -UA--neg pyuria -procalcitonin  Acute Metabolic Encephalopathy -likely due to opioids and hospital delirium -d/c seroquel, fentanyl, clonazepam -minimize opioids for pain -UA--neg pyuria -Haldol prn agitation -Serum B12--1005 -TSH--2.982 -ammonia--23  Diabetes mellitus type 2 -02/11/17--Hemoglobin A1c 7.0 -NovoLog sliding scale -Holdingtradjenta  Essential hypertension -Holding amlodipine and benazepril secondary to soft blood pressure -BP remains well controlled  Sinus tachycardia -Secondary to acute medical condition, pain, and a degree of volume depletion -Monitor clinically -Continue IV fluids-->improved -Personally reviewed EKG--sinus rhythm, nonspecific T wave changes  Thrombocytopenia -Suspect related to medications -worsening due to acute medical illness -Serum  B12--1005 -TSH--2.982  Dementia with behavioral disturbance -Continue Depakote, Seroquel -haldol prn agitation  Hyperlipidemia -Continue statin  History of stroke -Continue aspirin and statin  Hypothyroidism -Continue Synthroid -Check TSH--2.982     Disposition Plan: SNF in 1-2days  Family Communication:NoFamily at bedside--Total time spent 35 minutes. Greater than 50% spent face to face counseling and coordinating care.   Consultants: none  Code Status: FULL   DVT Prophylaxis:  [place] Lovenox   Procedures: As Listed in Progress Note Above  Antibiotics: Peri-op cefazolin    Subjective: Pt is somnolent but wakes up to voice.  She answers simple questions patient denies any headache, chest pain, shortness breath, abdominal pain.  The patient was only able to eat small amounts of breakfast and lunch.  No reports of vomiting, diarrhea, respiratory distress  Objective: Vitals:   02/13/17 0602 02/13/17 2045 02/14/17 0619 02/14/17 1545  BP: 101/62 (!) 113/55 (!) 111/59 (!) 112/44  Pulse: 97 90 (!) 105 80  Resp: 20 20 18 18   Temp: 99.1 F (37.3 C) 99.6 F (37.6 C) (!) 101.7 F (38.7 C) 98.4 F (36.9 C)  TempSrc: Axillary Axillary Axillary Axillary  SpO2:      Weight:      Height:        Intake/Output Summary (Last 24 hours) at 02/14/2017 1605 Last data filed at 02/14/2017 1300 Gross per 24 hour  Intake 240 ml  Output 750 ml  Net -510 ml   Weight change:  Exam:   General:  Pt is alert,not in acute distress  HEENT: No icterus, No thrush, No neck mass, [place]/AT  Cardiovascular: RRR, S1/S2, no rubs, no gallops  Respiratory: Bilateral crackles.  No wheezing  Abdomen: Soft/+BS, non tender, non distended, no guarding  Extremities: 1 + LLE edema, No lymphangitis, No petechiae, No rashes, no synovitis; RLE in bulky dressing   Data Reviewed: I have personally reviewed following labs and imaging studies Basic Metabolic Panel:  Recent  Labs  Lab 02/11/17 2035 02/13/17 0618 02/14/17 0716  NA 138 134* 138  K 4.6 4.2 3.9  CL 97* 97* 101  CO2 31 30 29   GLUCOSE 249* 227* 176*  BUN 20 13 12   CREATININE 0.91 0.85 0.87  CALCIUM 9.5 8.2* 8.3*   Liver Function Tests: No results for input(s): AST, ALT, ALKPHOS, BILITOT, PROT, ALBUMIN in the last 168 hours. No results for input(s): LIPASE, AMYLASE in the last 168 hours. Recent Labs  Lab 02/14/17 0716  AMMONIA 23   Coagulation Profile: No results for input(s): INR, PROTIME in the last 168 hours. CBC: Recent Labs  Lab 02/11/17 2035 02/12/17 1455 02/13/17 0618 02/14/17 0716  WBC 9.5 8.4 9.6 8.2  NEUTROABS 7.8*  --   --   --   HGB 14.0 12.1 11.3* 10.6*  HCT 42.2 37.0 33.9* 32.4*  MCV 94.2 95.9 94.2 95.3  PLT 141* 131* 112* 119*   Cardiac Enzymes: No results for input(s): CKTOTAL, CKMB, CKMBINDEX, TROPONINI in the last 168 hours. BNP: Invalid input(s): POCBNP CBG: Recent Labs  Lab 02/13/17 1654 02/13/17 2040 02/14/17 0623 02/14/17 0729 02/14/17 1119  GLUCAP 173* 140* 159* 166* 163*   HbA1C: Recent Labs    02/11/17 2035  HGBA1C 7.0*   Urine analysis:    Component Value Date/Time   COLORURINE YELLOW 02/13/2017 1415   APPEARANCEUR CLEAR 02/13/2017 1415   LABSPEC 1.013 02/13/2017 1415   PHURINE 5.0 02/13/2017 1415   GLUCOSEU NEGATIVE 02/13/2017 1415   HGBUR NEGATIVE 02/13/2017 1415   BILIRUBINUR NEGATIVE 02/13/2017 1415   KETONESUR 5 (A) 02/13/2017 1415   PROTEINUR NEGATIVE 02/13/2017 1415   UROBILINOGEN 1.0 05/17/2013 2305   NITRITE NEGATIVE 02/13/2017 1415   LEUKOCYTESUR NEGATIVE 02/13/2017 1415   Sepsis Labs: @LABRCNTIP (procalcitonin:4,lacticidven:4) ) Recent Results (from the past 240 hour(s))  MRSA PCR Screening     Status: None   Collection Time: 02/12/17 12:18 AM  Result Value Ref Range Status   MRSA by PCR NEGATIVE NEGATIVE Final    Comment:        The GeneXpert MRSA Assay (FDA approved for NASAL specimens only), is one  component of a comprehensive MRSA colonization surveillance program. It is not intended to diagnose MRSA infection nor to guide or monitor treatment for MRSA infections.   Surgical PCR screen     Status: None   Collection Time: 02/12/17 12:18 AM  Result Value Ref Range Status   MRSA, PCR NEGATIVE NEGATIVE Final   Staphylococcus aureus NEGATIVE NEGATIVE Final    Comment: (NOTE) The Xpert SA Assay (FDA approved for NASAL specimens in patients 40 years of age and older), is one component of a comprehensive surveillance program. It is not intended to diagnose infection nor to guide or monitor treatment.   Culture, Urine     Status: None   Collection Time: 02/13/17  2:15 PM  Result Value Ref Range Status   Specimen Description URINE, CATHETERIZED  Final   Special Requests NONE  Final   Culture   Final    NO GROWTH Performed at [facility], [address] [place],  [address]    Report Status 02/14/2017 FINAL  Final     Scheduled Meds: . aspirin EC  81 mg Oral Daily  . atorvastatin  10 mg Oral Daily  . calcitonin (salmon)  1 spray Alternating Nares Daily  . divalproex  250 mg Oral BID  . [START ON 02/15/2017] enoxaparin (LOVENOX) injection  40 mg Subcutaneous Q24H  .  insulin aspart  0-9 Units Subcutaneous TID WC  . latanoprost  1 drop Both Eyes QHS  . levothyroxine  75 mcg Oral QAC breakfast  . Melatonin  1 tablet Oral QHS  . mirabegron ER  25 mg Oral Daily  . pantoprazole  40 mg Oral Daily  . polyvinyl alcohol  2 drop Both Eyes Daily  . senna-docusate  2 tablet Oral BID  . sertraline  50 mg Oral Daily   Continuous Infusions: . sodium chloride 75 mL/hr at 02/14/17 1224    Procedures/Studies: Dg Chest 2 View  Result Date: 02/11/2017 CLINICAL DATA:  Preoperative screening. Lower leg fracture after a fall today. History of diabetes, hypertension, gastroesophageal reflux disease, stroke, cancer. EXAM: CHEST  2 VIEW COMPARISON:  05/17/2013 FINDINGS: Shallow  inspiration with elevation of the right hemidiaphragm. Heart size and pulmonary vascularity are normal. No airspace disease or consolidation in the lungs. No blunting of costophrenic angles. No pneumothorax. Surgical clips in the right axilla. Compression of midthoracic and upper lumbar vertebrae, as seen on previous chest CT from 05/20/2013 IMPRESSION: No active cardiopulmonary disease. Electronically Signed   By: Lucienne Capers M.D.   On: 02/11/2017 22:46   Dg Tibia/fibula Right  Result Date: 02/11/2017 CLINICAL DATA:  Patient status post fall. EXAM: RIGHT TIBIA AND FIBULA - 2 VIEW COMPARISON:  None. FINDINGS: Comminuted oblique fracture through the distal fibula. Comminuted medial malleolus fracture. Dislocation of the talus. Overlying soft tissue swelling about the ankle. No evidence for acute fracture of the proximal tibia or fibula. Knee joint degenerative changes. IMPRESSION: See dedicated ankle radiographs for description of distal fibula fracture, medial malleolus fracture and talar dislocation. No evidence for proximal tibial or fibular fracture. Electronically Signed   By: Lovey Newcomer M.D.   On: 02/11/2017 20:14   Dg Ankle Complete Right  Result Date: 02/12/2017 CLINICAL DATA:  Trimalleolar right ankle fracture EXAM: RIGHT ANKLE - COMPLETE 3+ VIEW COMPARISON:  02/11/2017 FLUOROSCOPY TIME:  Radiation Exposure Index (as provided by the fluoroscopic device): 2.05 mGy If the device does not provide the exposure index: Fluoroscopy Time:  30 seconds Number of Acquired Images:  10 FINDINGS: Initial images again demonstrate the trimalleolar fracture which has been significantly reduced. Fixation sideplate is subsequently noted along the distal fibula with the fracture fragments in anatomic alignment. Fixation screw and wire are noted traversing the medial malleolus with the fracture fragments in anatomic alignment. IMPRESSION: Status post ORIF of right ankle fracture Electronically Signed   By: Inez Catalina M.D.   On: 02/12/2017 12:15   Dg Ankle Complete Right  Result Date: 02/11/2017 CLINICAL DATA:  Patient fell and presents with right ankle pain. EXAM: RIGHT ANKLE - COMPLETE 3+ VIEW COMPARISON:  None. FINDINGS: Acute, closed, trimalleolar fracture-subluxation of the right ankle is noted. A transverse fracture through the medial malleolus, coronal fracture through posterior malleolus and comminuted coronal oblique fracture of the distal diaphysis through metadiaphysis of the fibula with extension into the distal tibiofibular syndesmosis is noted. There is widening of the medial clear space of the ankle mortise by 13 mm. There is marked soft tissue swelling about the malleoli. The subtalar and midfoot articulations appear congruent. No calcaneal fracture. Intact base of fifth metatarsal. IMPRESSION: Acute, closed, trimalleolar fracture - subluxation of the right ankle as above described with associated marked soft tissue swelling. Widening of the medial clear space of the ankle mortise by 13 mm due to the fracture-subluxation. Electronically Signed   By: Meredith Leeds.D.  On: 02/11/2017 20:15   Dg C-arm 1-60 Min-no Report  Result Date: 02/12/2017 Fluoroscopy was utilized by the requesting physician.  No radiographic interpretation.   Dg Hip Unilat W Or Wo Pelvis 2-3 Views Right  Result Date: 02/11/2017 CLINICAL DATA:  Fall.  Pain. EXAM: DG HIP (WITH OR WITHOUT PELVIS) 2-3V RIGHT COMPARISON:  None. FINDINGS: There is no evidence of hip fracture or dislocation. There is no evidence of arthropathy or other focal bone abnormality. IMPRESSION: Negative. Electronically Signed   By: Misty Stanley M.D.   On: 02/11/2017 20:14    Genia Perin, DO  [facility] Pager [phone]  If 7PM-7AM, please contact night-coverage [email or web address] Password [email or web address] 02/14/2017, 4:05 PM   LOS: 3 days

## 2017-02-15 ENCOUNTER — Inpatient Hospital Stay (HOSPITAL_COMMUNITY): Payer: Medicare Other

## 2017-02-15 DIAGNOSIS — J811 Chronic pulmonary edema: Secondary | ICD-10-CM

## 2017-02-15 DIAGNOSIS — E119 Type 2 diabetes mellitus without complications: Secondary | ICD-10-CM

## 2017-02-15 DIAGNOSIS — J810 Acute pulmonary edema: Secondary | ICD-10-CM

## 2017-02-15 DIAGNOSIS — I5030 Unspecified diastolic (congestive) heart failure: Secondary | ICD-10-CM

## 2017-02-15 LAB — GLUCOSE, CAPILLARY
GLUCOSE-CAPILLARY: 139 mg/dL — AB (ref 65–99)
GLUCOSE-CAPILLARY: 94 mg/dL (ref 65–99)
Glucose-Capillary: 148 mg/dL — ABNORMAL HIGH (ref 65–99)
Glucose-Capillary: 149 mg/dL — ABNORMAL HIGH (ref 65–99)
Glucose-Capillary: 223 mg/dL — ABNORMAL HIGH (ref 65–99)

## 2017-02-15 LAB — PROCALCITONIN: Procalcitonin: <0.1 ng/mL

## 2017-02-15 LAB — CBC
HEMATOCRIT: 30.4 % — AB (ref 36.0–46.0)
HEMOGLOBIN: 10 g/dL — AB (ref 12.0–15.0)
MCH: 31.3 pg (ref 26.0–34.0)
MCHC: 32.9 g/dL (ref 30.0–36.0)
MCV: 95.3 fL (ref 78.0–100.0)
Platelets: 129 10*3/uL — ABNORMAL LOW (ref 150–400)
RBC: 3.19 MIL/uL — AB (ref 3.87–5.11)
RDW: 12.3 % (ref 11.5–15.5)
WBC: 7.2 10*3/uL (ref 4.0–10.5)

## 2017-02-15 LAB — ECHOCARDIOGRAM COMPLETE
Height: 63 in
Weight: 3054.4 oz

## 2017-02-15 MED ORDER — FUROSEMIDE 10 MG/ML IJ SOLN
40.0000 mg | Freq: Once | INTRAMUSCULAR | Status: AC
  Administered 2017-02-15: 40 mg via INTRAVENOUS
  Filled 2017-02-15: qty 4

## 2017-02-15 NOTE — Progress Notes (Signed)
Pt refused all morning medications. Attempted to hit nursing staff. Will let MD know.

## 2017-02-15 NOTE — Evaluation (Signed)
Physical Therapy Evaluation Patient Details Name: Alexis Reese MRN: [id number] DOB: 1934-06-07 Today's Date: 02/15/2017   History of Present Illness  Alexis Reese  is a 82 y.o. female, s/p ORIF right hip fracture 02/13/17. with CVA, Dm2, Hypertension, Anxiety who presents with mechanical fall while trying to walk with walker to the bathroom.  Pt denies syncope, cp, palp, sob, lower ext edema.     Clinical Impression  Patient limited for sitting at bedside and unable to attempt transfers due to c/o fatigue and easily agitated.  Patient will benefit from continued physical therapy in hospital and recommended venue below to increase strength, balance, endurance for safe ADLs and gait.    Follow Up Recommendations SNF;Supervision/Assistance - 24 hour    Equipment Recommendations  None recommended by PT    Recommendations for Other Services       Precautions / Restrictions Precautions Precautions: Fall Restrictions Weight Bearing Restrictions: Yes RLE Weight Bearing: Non weight bearing      Mobility  Bed Mobility Overal bed mobility: Needs Assistance Bed Mobility: Supine to Sit;Sit to Supine     Supine to sit: Max assist;+2 for safety/equipment Sit to supine: Max assist;+2 for safety/equipment      Transfers                    Ambulation/Gait                Stairs            Wheelchair Mobility    Modified Rankin (Stroke Patients Only)       Balance Overall balance assessment: Needs assistance Sitting-balance support: No upper extremity supported;Feet supported Sitting balance-Leahy Scale: Fair Sitting balance - Comments: patient limited mostly due to poor carry over for following instructions                                     Pertinent Vitals/Pain Pain Assessment: Faces Faces Pain Scale: Hurts even more Pain Location: LLE Pain Descriptors / Indicators: Grimacing;Guarding Pain Intervention(s): Limited activity within  patient's tolerance;Monitored during session    Home Living Family/patient expects to be discharged to:: Skilled nursing facility                      Prior Function Level of Independence: Needs assistance         Comments: Patient is poor historian     Hand Dominance        Extremity/Trunk Assessment   Upper Extremity Assessment Upper Extremity Assessment: Generalized weakness    Lower Extremity Assessment Lower Extremity Assessment: Generalized weakness;RLE deficits/detail RLE Deficits / Details: grossly -3/5 except right ankle/foot in splint and not tested       Communication   Communication: Other (comment)(patient appears confused and requires constant verbal/tacile cueing to follow instructions)  Cognition Arousal/Alertness: Awake/alert Behavior During Therapy: Agitated Overall Cognitive Status: No family/caregiver present to determine baseline cognitive functioning                                        General Comments      Exercises     Assessment/Plan    PT Assessment Patient needs continued PT services  PT Problem List Decreased strength;Decreased activity tolerance;Decreased balance;Decreased mobility       PT Treatment Interventions Gait training;Functional  mobility training;Therapeutic activities;Therapeutic exercise;Patient/family education    PT Goals (Current goals can be found in the Care Plan section)  Acute Rehab PT Goals Patient Stated Goal: not stated PT Goal Formulation: With patient Time For Goal Achievement: 03/01/17 Potential to Achieve Goals: Fair    Frequency Min 3X/week   Barriers to discharge        Co-evaluation               AM-PAC PT "6 Clicks" Daily Activity  Outcome Measure Difficulty turning over in bed (including adjusting bedclothes, sheets and blankets)?: A Lot Difficulty moving from lying on back to sitting on the side of the bed? : A Lot Difficulty sitting down on and  standing up from a chair with arms (e.g., wheelchair, bedside commode, etc,.)?: Unable Help needed moving to and from a bed to chair (including a wheelchair)?: Total Help needed walking in hospital room?: Total Help needed climbing 3-5 steps with a railing? : Total 6 Click Score: 8    End of Session Equipment Utilized During Treatment: Oxygen Activity Tolerance: Patient limited by fatigue;Treatment limited secondary to agitation Patient left: in bed;with call bell/phone within reach;with nursing/sitter in room;with bed alarm set Nurse Communication: Mobility status PT Visit Diagnosis: Unsteadiness on feet (R26.81);Other abnormalities of gait and mobility (R26.89);Muscle weakness (generalized) (M62.81)    Time: 8250-0370 PT Time Calculation (min) (ACUTE ONLY): 17 min   Charges:   PT Evaluation $PT Eval Moderate Complexity: 1 Mod PT Treatments $Therapeutic Activity: 8-22 mins   PT G Codes:        2:38 PM, 05-Mar-2017 Lonell Grandchild, MPT Physical Therapist with [facility] [phone] office [phone] mobile phone

## 2017-02-15 NOTE — Plan of Care (Signed)
  Acute Rehab PT Goals(only PT should resolve) Pt Will Go Supine/Side To Sit 02/15/2017 1440 - Progressing by Lonell Grandchild, PT Flowsheets Taken 02/15/2017 1440  Pt will go Supine/Side to Sit with minimal assist Patient Will Transfer Sit To/From Stand 02/15/2017 1440 - Progressing by Lonell Grandchild, PT Flowsheets Taken 02/15/2017 1440  Patient will transfer sit to/from stand with moderate assist Pt Will Transfer Bed To Chair/Chair To Bed 02/15/2017 1440 - Progressing by Lonell Grandchild, PT Flowsheets Taken 02/15/2017 1440  Pt will Transfer Bed to Chair/Chair to Bed with mod assist Pt Will Ambulate 02/15/2017 1440 - Progressing by Lonell Grandchild, PT Flowsheets Taken 02/15/2017 1440  Pt will Ambulate 10 feet;with moderate assist;with rolling walker Note With NWB RLE  2:42 PM, 02/15/17 Lonell Grandchild, MPT Physical Therapist with [facility] [phone] office [phone] mobile phone

## 2017-02-15 NOTE — Progress Notes (Signed)
Patient finger sticks for CBGs unsuccessful, last successful finger stick was at 1631 on 02/14/2017.  Patient fighting staff each attempt to perform finger stick. Patient continues to be agitated and anxious.  Patient given NORCO 5/325mg  at 2010-which she spit outin floor, patient given ATIVAN 1 MG AT 2323 with no changes in patient agitation or anxiousness.

## 2017-02-15 NOTE — Progress Notes (Signed)
Patient spit out all her medications.  Medications were mixed in applesauce to try to encourage patient to swallow without success.  Patient also refusing to allow finger sticks.

## 2017-02-15 NOTE — Progress Notes (Signed)
Patient continues to yell and call out for help and persons not in room.  Patient given [facility] 5/325mg  for leg/ankle/back pain.  Medication placed in apple sauce, patient took this time.  Will continue to monitor patient agitation and anxiety.

## 2017-02-15 NOTE — Progress Notes (Signed)
PROGRESS NOTE  Alexis Reese KDX:[id number] DOB: 04/26/1934 DOA: 02/11/2017 PCP: No primary care provider on file.  Brief History: 82 year old female with a history of dementia, diabetes mellitus type 2, hypertension, stroke, hyperlipidemia, depression presenting after mechanical fall at her skilled nursing facility. Patient is unable to provide any significant history secondary to her cognitive impairment. There was no loss of consciousness. The patient did not hit her head. X-rays of her right ankle revealed a closed trimalleolar fracture with subluxation. Orthopedics was consulted. Dr. Aline Brochure to the patient the surgery and performed ORIF of her trimalleolar fracture. Postoperatively, the patient became agitated and exhibited delirium. Patient was started on as needed Haldol.  Assessment/Plan: Right ankle trimalleolar fracture -Appreciate orthopedics -02/12/17--Status post ORIF right ankle fracture -PT eval-->SNF -pain control  Acute Respiratory Failure with hypoxia/Pulmonary Edema -oxygen sat 85% on RA-->98% on 3L -personally reviewed CXR--pulm vascular congestion -wean back to room air -lasix 40 mg IV on 11/11 and 11/12 -repeat cxr am 11/13 -saline locked IVF -Echo--EF 65-70%, grade 1 DD, no WMA  Fever -had 101.7 fever 11/10 evening -CXR--no consolidation -blood cultures--neg to date -UA--neg pyuria -[id number]--<4.09  Acute Metabolic Encephalopathy -likely due to opioids, ativan and hospital delirium -d/c seroquel, fentanyl, clonazepam -minimize opioids and benzos -UA--neg pyuria -Haldol prn agitation -Serum B12--1005 -TSH--2.982 -ammonia--23  Diabetes mellitus type 2 -02/11/17--Hemoglobin A1c 7.0 -NovoLog sliding scale -Holdingtradjenta  Essential hypertension -Holding amlodipine and benazepril secondary to soft blood pressure -BP remains well controlled  Sinus tachycardia -Secondary to acute medical condition, pain, and a degree  of volume depletion -Monitor clinically -Continue IV fluids-->improved  -Personally reviewed EKG--sinus rhythm, nonspecific T wave changes  Thrombocytopenia -Suspect related to medications -worsening due to acute medical illness -Serum B12--1005 -TSH--2.982  Dementia with behavioral disturbance -Continue Depakote, Seroquel -haldol prn agitation  Hyperlipidemia -Continue statin  History of stroke -Continue aspirin and statin  Hypothyroidism -Continue Synthroid -Check TSH--2.982     Disposition Plan: SNF 11/13 if stable Family Communication:NoFamily at bedside   Consultants:none  Code Status: FULL   DVT Prophylaxis:  [place] Lovenox   Procedures: As Listed in Progress Note Above  Antibiotics: Peri-op cefazolin   Subjective: Patient denies fevers, chills, headache, chest pain, dyspnea, nausea, vomiting, diarrhea, abdominal pain, dysuria,   Objective: Vitals:   02/14/17 1545 02/14/17 1850 02/15/17 0508 02/15/17 1559  BP: (!) 112/44  (!) 127/58 121/75  Pulse: 80 80 87 91  Resp: 18  18 18   Temp: 98.4 F (36.9 C)  98.9 F (37.2 C) 98.7 F (37.1 C)  TempSrc: Axillary  Axillary Axillary  SpO2:  98%  97%  Weight:   86.6 kg (190 lb 14.4 oz)   Height:   5\' 3"  (1.6 m)     Intake/Output Summary (Last 24 hours) at 02/15/2017 1729 Last data filed at 02/15/2017 1714 Gross per 24 hour  Intake -  Output 1900 ml  Net -1900 ml   Weight change:  Exam:   General:  Pt is alert, follows commands appropriately, not in acute distress  HEENT: No icterus, No thrush, No neck mass, [place]/AT  Cardiovascular: RRR, S1/S2, no rubs, no gallops  Respiratory: bibasilar rales, no wheeze  Abdomen: Soft/+BS, non tender, non distended, no guarding  Extremities: 1 + LLE edema, No lymphangitis, No petechiae, No rashes, no synovitis   Data Reviewed: I have personally reviewed following labs and imaging studies Basic Metabolic Panel: Recent Labs  Lab  02/11/17 2035 02/13/17 0618 02/14/17 7353  NA 138 134* 138  K 4.6 4.2 3.9  CL 97* 97* 101  CO2 31 30 29   GLUCOSE 249* 227* 176*  BUN 20 13 12   CREATININE 0.91 0.85 0.87  CALCIUM 9.5 8.2* 8.3*   Liver Function Tests: No results for input(s): AST, ALT, ALKPHOS, BILITOT, PROT, ALBUMIN in the last 168 hours. No results for input(s): LIPASE, AMYLASE in the last 168 hours. Recent Labs  Lab 02/14/17 0716  AMMONIA 23   Coagulation Profile: No results for input(s): INR, PROTIME in the last 168 hours. CBC: Recent Labs  Lab 02/11/17 2035 02/12/17 1455 02/13/17 0618 02/14/17 0716 02/15/17 0525  WBC 9.5 8.4 9.6 8.2 7.2  NEUTROABS 7.8*  --   --   --   --   HGB 14.0 12.1 11.3* 10.6* 10.0*  HCT 42.2 37.0 33.9* 32.4* 30.4*  MCV 94.2 95.9 94.2 95.3 95.3  PLT 141* 131* 112* 119* 129*   Cardiac Enzymes: No results for input(s): CKTOTAL, CKMB, CKMBINDEX, TROPONINI in the last 168 hours. BNP: Invalid input(s): POCBNP CBG: Recent Labs  Lab 02/14/17 1631 02/15/17 0501 02/15/17 0734 02/15/17 1117 02/15/17 1657  GLUCAP 115* 148* 149* 139* 223*   HbA1C: No results for input(s): HGBA1C in the last 72 hours. Urine analysis:    Component Value Date/Time   COLORURINE YELLOW 02/13/2017 [place] 02/13/2017 1415   LABSPEC 1.013 02/13/2017 1415   PHURINE 5.0 02/13/2017 1415   GLUCOSEU NEGATIVE 02/13/2017 1415   HGBUR NEGATIVE 02/13/2017 1415   BILIRUBINUR NEGATIVE 02/13/2017 1415   KETONESUR 5 (A) 02/13/2017 1415   PROTEINUR NEGATIVE 02/13/2017 1415   UROBILINOGEN 1.0 05/17/2013 2305   NITRITE NEGATIVE 02/13/2017 1415   LEUKOCYTESUR NEGATIVE 02/13/2017 1415   Sepsis Labs: @LABRCNTIP (procalcitonin:4,lacticidven:4) ) Recent Results (from the past 240 hour(s))  MRSA PCR Screening     Status: None   Collection Time: 02/12/17 12:18 AM  Result Value Ref Range Status   MRSA by PCR NEGATIVE NEGATIVE Final    Comment:        The GeneXpert MRSA Assay (FDA approved  for NASAL specimens only), is one component of a comprehensive MRSA colonization surveillance program. It is not intended to diagnose MRSA infection nor to guide or monitor treatment for MRSA infections.   Surgical PCR screen     Status: None   Collection Time: 02/12/17 12:18 AM  Result Value Ref Range Status   MRSA, PCR NEGATIVE NEGATIVE Final   Staphylococcus aureus NEGATIVE NEGATIVE Final    Comment: (NOTE) The Xpert SA Assay (FDA approved for NASAL specimens in patients 25 years of age and older), is one component of a comprehensive surveillance program. It is not intended to diagnose infection nor to guide or monitor treatment.   Culture, Urine     Status: None   Collection Time: 02/13/17  2:15 PM  Result Value Ref Range Status   Specimen Description URINE, CATHETERIZED  Final   Special Requests NONE  Final   Culture   Final    NO GROWTH Performed at [facility], [address] [place], [place] [address]    Report Status 02/14/2017 FINAL  Final  Culture, blood (Routine X 2) w Reflex to ID Panel     Status: None (Preliminary result)   Collection Time: 02/15/17  5:26 AM  Result Value Ref Range Status   Specimen Description BLOOD LEFT ARM  Final   Special Requests   Final    BOTTLES DRAWN AEROBIC AND ANAEROBIC Blood  Culture results may not be optimal due to an inadequate volume of blood received in culture bottles   Culture PENDING  Incomplete   Report Status PENDING  Incomplete  Culture, blood (Routine X 2) w Reflex to ID Panel     Status: None (Preliminary result)   Collection Time: 02/15/17  5:40 AM  Result Value Ref Range Status   Specimen Description BLOOD RIGHT HAND  Final   Special Requests   Final    BOTTLES DRAWN AEROBIC AND ANAEROBIC Blood Culture adequate volume   Culture PENDING  Incomplete   Report Status PENDING  Incomplete     Scheduled Meds: . aspirin EC  81 mg Oral Daily  . atorvastatin  10 mg Oral Daily  . calcitonin (salmon)  1 spray  Alternating Nares Daily  . divalproex  250 mg Oral BID  . enoxaparin (LOVENOX) injection  40 mg Subcutaneous Q24H  . insulin aspart  0-9 Units Subcutaneous TID WC  . latanoprost  1 drop Both Eyes QHS  . levothyroxine  75 mcg Oral QAC breakfast  . Melatonin  1 tablet Oral QHS  . mirabegron ER  25 mg Oral Daily  . pantoprazole  40 mg Oral Daily  . polyvinyl alcohol  2 drop Both Eyes Daily  . senna-docusate  2 tablet Oral BID  . sertraline  50 mg Oral Daily   Continuous Infusions:  Procedures/Studies: Dg Chest 2 View  Result Date: 02/11/2017 CLINICAL DATA:  Preoperative screening. Lower leg fracture after a fall today. History of diabetes, hypertension, gastroesophageal reflux disease, stroke, cancer. EXAM: CHEST  2 VIEW COMPARISON:  05/17/2013 FINDINGS: Shallow inspiration with elevation of the right hemidiaphragm. Heart size and pulmonary vascularity are normal. No airspace disease or consolidation in the lungs. No blunting of costophrenic angles. No pneumothorax. Surgical clips in the right axilla. Compression of midthoracic and upper lumbar vertebrae, as seen on previous chest CT from 05/20/2013 IMPRESSION: No active cardiopulmonary disease. Electronically Signed   By: Lucienne Capers M.D.   On: 02/11/2017 22:46   Dg Tibia/fibula Right  Result Date: 02/11/2017 CLINICAL DATA:  Patient status post fall. EXAM: RIGHT TIBIA AND FIBULA - 2 VIEW COMPARISON:  None. FINDINGS: Comminuted oblique fracture through the distal fibula. Comminuted medial malleolus fracture. Dislocation of the talus. Overlying soft tissue swelling about the ankle. No evidence for acute fracture of the proximal tibia or fibula. Knee joint degenerative changes. IMPRESSION: See dedicated ankle radiographs for description of distal fibula fracture, medial malleolus fracture and talar dislocation. No evidence for proximal tibial or fibular fracture. Electronically Signed   By: Lovey Newcomer M.D.   On: 02/11/2017 20:14   Dg Ankle  Complete Right  Result Date: 02/12/2017 CLINICAL DATA:  Trimalleolar right ankle fracture EXAM: RIGHT ANKLE - COMPLETE 3+ VIEW COMPARISON:  02/11/2017 FLUOROSCOPY TIME:  Radiation Exposure Index (as provided by the fluoroscopic device): 2.05 mGy If the device does not provide the exposure index: Fluoroscopy Time:  30 seconds Number of Acquired Images:  10 FINDINGS: Initial images again demonstrate the trimalleolar fracture which has been significantly reduced. Fixation sideplate is subsequently noted along the distal fibula with the fracture fragments in anatomic alignment. Fixation screw and wire are noted traversing the medial malleolus with the fracture fragments in anatomic alignment. IMPRESSION: Status post ORIF of right ankle fracture Electronically Signed   By: Inez Catalina M.D.   On: 02/12/2017 12:15   Dg Ankle Complete Right  Result Date: 02/11/2017 CLINICAL DATA:  Patient fell and presents with  right ankle pain. EXAM: RIGHT ANKLE - COMPLETE 3+ VIEW COMPARISON:  None. FINDINGS: Acute, closed, trimalleolar fracture-subluxation of the right ankle is noted. A transverse fracture through the medial malleolus, coronal fracture through posterior malleolus and comminuted coronal oblique fracture of the distal diaphysis through metadiaphysis of the fibula with extension into the distal tibiofibular syndesmosis is noted. There is widening of the medial clear space of the ankle mortise by 13 mm. There is marked soft tissue swelling about the malleoli. The subtalar and midfoot articulations appear congruent. No calcaneal fracture. Intact base of fifth metatarsal. IMPRESSION: Acute, closed, trimalleolar fracture - subluxation of the right ankle as above described with associated marked soft tissue swelling. Widening of the medial clear space of the ankle mortise by 13 mm due to the fracture-subluxation. Electronically Signed   By: Ashley Royalty M.D.   On: 02/11/2017 20:15   Dg Chest Port 1 View  Result Date:  02/14/2017 CLINICAL DATA:  Postop ankle surgery EXAM: PORTABLE CHEST 1 VIEW COMPARISON:  02/11/2017 FINDINGS: Enlarged cardiac silhouette. Low lung volumes. Central venous congestion. Perihilar airspace disease. IMPRESSION: Findings most suggestive of congestive heart failure with mild edema and low lung volumes Electronically Signed   By: Suzy Bouchard M.D.   On: 02/14/2017 16:22   Dg C-arm 1-60 Min-no Report  Result Date: 02/12/2017 Fluoroscopy was utilized by the requesting physician.  No radiographic interpretation.   Dg Hip Unilat W Or Wo Pelvis 2-3 Views Right  Result Date: 02/11/2017 CLINICAL DATA:  Fall.  Pain. EXAM: DG HIP (WITH OR WITHOUT PELVIS) 2-3V RIGHT COMPARISON:  None. FINDINGS: There is no evidence of hip fracture or dislocation. There is no evidence of arthropathy or other focal bone abnormality. IMPRESSION: Negative. Electronically Signed   By: Misty Stanley M.D.   On: 02/11/2017 20:14    Kelcey Korus, DO  [facility] Pager [phone]  If 7PM-7AM, please contact night-coverage [email or web address] Password [email or web address] 02/15/2017, 5:29 PM   LOS: 4 days

## 2017-02-15 NOTE — Progress Notes (Signed)
*  PRELIMINARY RESULTS* Echocardiogram 2D Echocardiogram has been performed.  Alexis Reese 02/15/2017, 3:11 PM

## 2017-02-16 ENCOUNTER — Encounter (HOSPITAL_COMMUNITY): Payer: Self-pay | Admitting: Orthopedic Surgery

## 2017-02-16 ENCOUNTER — Inpatient Hospital Stay (HOSPITAL_COMMUNITY): Payer: Medicare Other

## 2017-02-16 LAB — GLUCOSE, CAPILLARY
GLUCOSE-CAPILLARY: 138 mg/dL — AB (ref 65–99)
Glucose-Capillary: 145 mg/dL — ABNORMAL HIGH (ref 65–99)
Glucose-Capillary: 147 mg/dL — ABNORMAL HIGH (ref 65–99)
Glucose-Capillary: 158 mg/dL — ABNORMAL HIGH (ref 65–99)

## 2017-02-16 LAB — CBC
HCT: 33.6 % — ABNORMAL LOW (ref 36.0–46.0)
Hemoglobin: 11.1 g/dL — ABNORMAL LOW (ref 12.0–15.0)
MCH: 31 pg (ref 26.0–34.0)
MCHC: 33 g/dL (ref 30.0–36.0)
MCV: 93.9 fL (ref 78.0–100.0)
PLATELETS: 163 10*3/uL (ref 150–400)
RBC: 3.58 MIL/uL — AB (ref 3.87–5.11)
RDW: 12.3 % (ref 11.5–15.5)
WBC: 7 10*3/uL (ref 4.0–10.5)

## 2017-02-16 LAB — BASIC METABOLIC PANEL
ANION GAP: 11 (ref 5–15)
BUN: 17 mg/dL (ref 6–20)
CALCIUM: 8.5 mg/dL — AB (ref 8.9–10.3)
CO2: 31 mmol/L (ref 22–32)
Chloride: 97 mmol/L — ABNORMAL LOW (ref 101–111)
Creatinine, Ser: 0.82 mg/dL (ref 0.44–1.00)
GFR calc Af Amer: >60 mL/min (ref >60)
GLUCOSE: 161 mg/dL — AB (ref 65–99)
Potassium: 3.3 mmol/L — ABNORMAL LOW (ref 3.5–5.1)
SODIUM: 139 mmol/L (ref 135–145)

## 2017-02-16 MED ORDER — ORAL CARE MOUTH RINSE
15.0000 mL | Freq: Two times a day (BID) | OROMUCOSAL | Status: DC
  Administered 2017-02-16: 15 mL via OROMUCOSAL

## 2017-02-16 MED ORDER — CLONAZEPAM 0.5 MG PO TABS: 0.5000 mg | ORAL_TABLET | Freq: Three times a day (TID) | ORAL | 0 refills | Status: AC | PRN

## 2017-02-16 MED ORDER — HYDROCODONE-ACETAMINOPHEN 5-325 MG PO TABS: 1.0000 | ORAL_TABLET | Freq: Four times a day (QID) | ORAL | 0 refills | Status: AC | PRN

## 2017-02-16 NOTE — NC FL2 (Signed)
[place] MEDICAID FL2 LEVEL OF CARE SCREENING TOOL     IDENTIFICATION  Patient Name: Alexis Reese Birthdate: 05-12-1934 Sex: female Admission Date (Current Location): 02/11/2017  [facility] and [place] Number:  [employer] and Address:  [facility] [address], [place]      Provider Number: [id number]  Attending Physician Name and Address:  Orson Eva, MD  Relative Name and Phone Number:       Current Level of Care: Hospital Recommended Level of Care: [place] Prior Approval Number:    Date Approved/Denied:   PASRR Number:    Discharge Plan: SNF    Current Diagnoses: Patient Active Problem List   Diagnosis Date Noted  . Pulmonary edema   . Acute respiratory failure with hypoxia ([place]) 02/14/2017  . Acute metabolic encephalopathy 14/78/2956  . Closed trimalleolar fracture of right ankle   . Ankle fracture 02/11/2017  . Type II diabetes mellitus with complication, uncontrolled ([place]) 04/01/2014  . PBA (pseudobulbar affect) 09/15/2013  . Psychosis ([place]) 09/15/2013  . Vitamin D deficiency 09/15/2013  . Dyslipidemia 09/15/2013  . Hypothyroidism 09/15/2013  . Anxiety and depression 05/28/2013  . Dementia with behavioral disturbance 05/28/2013  . B12 deficiency 05/28/2013  . Essential hypertension, benign 05/28/2013  . Obesity, unspecified 05/27/2012  . Diabetes mellitus without complication ([place]) 21/30/8657  . Back pain 05/24/2012  . Constipation 05/24/2012  . Degenerative arthritis of lumbar spine 05/24/2012  . Other secondary scoliosis, lumbar region 05/24/2012  . Depression with anxiety 05/24/2012    Orientation RESPIRATION BLADDER Height & Weight        Normal Incontinent Weight: 182 lb 15.7 oz (83 kg) Height:  5\' 3"  (160 cm)  BEHAVIORAL SYMPTOMS/MOOD NEUROLOGICAL BOWEL NUTRITION STATUS      Continent Diet  AMBULATORY STATUS COMMUNICATION OF NEEDS Skin   Limited Assist   Normal                    Personal Care Assistance Level of Assistance  Bathing, Feeding, Dressing Bathing Assistance: Limited assistance Feeding assistance: Independent Dressing Assistance: Limited assistance     Functional Limitations Info  Sight, Hearing, Speech Sight Info: Adequate Hearing Info: Adequate Speech Info: Adequate    SPECIAL CARE FACTORS FREQUENCY                       Contractures Contractures Info: Not present    Additional Factors Info  Code Status, Allergies, Psychotropic Code Status Info: Full code Allergies Info: codeine, influenza vac split quad, morphine, tuberculin test  Psychotropic Info: seroquel, depakote sprinkle, klonopin         Current Medications (02/16/2017):  This is the current hospital active medication list Current Facility-Administered Medications  Medication Dose Route Frequency Provider Last Rate Last Dose  . acetaminophen (TYLENOL) tablet 650 mg  650 mg Oral Q6H PRN Jani Gravel, MD   650 mg at 02/16/17 0912   Or  . acetaminophen (TYLENOL) suppository 650 mg  650 mg Rectal Q6H PRN Jani Gravel, MD      . aspirin EC tablet 81 mg  81 mg Oral Daily Jani Gravel, MD   81 mg at 02/14/17 0954  . atorvastatin (LIPITOR) tablet 10 mg  10 mg Oral Daily Jani Gravel, MD   10 mg at 02/14/17 0954  . calcitonin (salmon) (MIACALCIN/FORTICAL) nasal spray 1 spray  1 spray Alternating Nares Daily Jani Gravel, MD   1 spray at 02/13/17 1451  .  divalproex (DEPAKOTE) DR tablet 250 mg  250 mg Oral BID Jani Gravel, MD   250 mg at 02/15/17 2220  . enoxaparin (LOVENOX) injection 40 mg  40 mg Subcutaneous Q24H Tat, David, MD   40 mg at 02/15/17 0851  . haloperidol lactate (HALDOL) injection 5 mg  5 mg Intravenous Q6H PRN Orson Eva, MD   5 mg at 02/15/17 1743  . HYDROcodone-acetaminophen (NORCO/VICODIN) 5-325 MG per tablet 1 tablet  1 tablet Oral Q6H PRN Carole Civil, MD   1 tablet at 02/16/17 0601  . insulin aspart (novoLOG) injection 0-9 Units  0-9 Units Subcutaneous TID WC  Jani Gravel, MD   2 Units at 02/16/17 [phone]  . latanoprost (XALATAN) 0.005 % ophthalmic solution 1 drop  1 drop Both Eyes QHS Jani Gravel, MD   1 drop at 02/15/17 2219  . levothyroxine (SYNTHROID, LEVOTHROID) tablet 75 mcg  75 mcg Oral QAC breakfast Jani Gravel, MD   75 mcg at 02/16/17 0911  . LORazepam (ATIVAN) injection 1 mg  1 mg Intravenous Q4H PRN Tacey Ruiz, MD   1 mg at 02/16/17 1134  . MEDLINE mouth rinse  15 mL Mouth Rinse BID Tat, David, MD   15 mL at 02/16/17 0606  . Melatonin TABS 3 mg  1 tablet Oral QHS Jani Gravel, MD   3 mg at 02/15/17 2219  . menthol-cetylpyridinium (CEPACOL) lozenge 3 mg  1 lozenge Oral PRN Carole Civil, MD       Or  . phenol (CHLORASEPTIC) mouth spray 1 spray  1 spray Mouth/Throat PRN Carole Civil, MD      . metoCLOPramide (REGLAN) tablet 5-10 mg  5-10 mg Oral Q8H PRN Carole Civil, MD       Or  . metoCLOPramide (REGLAN) injection 5-10 mg  5-10 mg Intravenous Q8H PRN Carole Civil, MD      . mirabegron ER [facility]) tablet 25 mg  25 mg Oral Daily Jani Gravel, MD   25 mg at 02/16/17 0913  . ondansetron (ZOFRAN) injection 4 mg  4 mg Intravenous Q6H PRN Jani Gravel, MD      . pantoprazole (PROTONIX) EC tablet 40 mg  40 mg Oral Daily Jani Gravel, MD   40 mg at 02/16/17 0917  . polyvinyl alcohol (LIQUIFILM TEARS) 1.4 % ophthalmic solution 2 drop  2 drop Both Eyes Daily Tat, David, MD   2 drop at 02/15/17 0957  . senna-docusate (Senokot-S) tablet 2 tablet  2 tablet Oral BID Jani Gravel, MD   2 tablet at 02/15/17 2220  . sertraline (ZOLOFT) tablet 50 mg  50 mg Oral Daily Jani Gravel, MD   50 mg at 02/14/17 6803     Discharge Medications: Please see discharge summary for a list of discharge medications.  Relevant Imaging Results:  Relevant Lab Results:   Additional Information    Seab Axel, Clydene Pugh, LCSW

## 2017-02-16 NOTE — Clinical Social Work Note (Signed)
Clinical Social Work Assessment  Patient Details  Name: Alexis Reese MRN: [id number] Date of Birth: 07/05/1934  Date of referral:  02/16/17               Reason for consult:  Other (Comment Required)(return to [facility])                Permission sought to share information with:    Permission granted to share information::     Name::        Agency::     Relationship::     Contact Information:     Housing/Transportation Living arrangements for the past 2 months:  [place] of Information:  Facility Patient Interpreter Needed:  None Criminal Activity/Legal Involvement Pertinent to Current Situation/Hospitalization:  No - Comment as needed Significant Relationships:  Adult Children, Siblings Lives with:  Facility Resident Do you feel safe going back to the place where you live?  Yes Need for family participation in patient care:  Yes (Comment)  Care giving concerns: No care giving concerns expressed, facility resident.    Social Worker assessment / plan:  Patient is a long term resident at [facility] having been there the past 2-3 years. Patient uses a walker, feeds herself and receives assistance with ADLs. Patient has episodes of incontinence due to being unable to get to the bathroom in time. Patient is followed by [facility].  Per Lynnae Sandhoff, at [facility], she spoke with patient's guardians today and will update them on the discharged. Patient can return to the facility at discharge. LCSW advised that patient was discharging today and discharge clinicals were sent to the facility.  LCSW signing off.   Employment status:  Retired Nurse, adult, Medicaid In [place] PT Recommendations:    Information / Referral to community resources:     Patient/Family's Response to care:  Facility spoke with guardians today and provided admit and discharge information.  Patient/Family's Understanding of and  Emotional Response to Diagnosis, Current Treatment, and Prognosis:  Facility spoke with guardians today discussed diagnosis and treatment.   Emotional Assessment Appearance:  Appears stated age Attitude/Demeanor/Rapport:  Unable to Assess Affect (typically observed):  Unable to Assess Orientation:    Alcohol / Substance use:  Not Applicable Psych involvement (Current and /or in the community):  No (Comment)  Discharge Needs  Concerns to be addressed:  Other (Comment Required(return to facility) Readmission within the last 30 days:  No Current discharge risk:  None Barriers to Discharge:  No Barriers Identified   Ihor Gully, LCSW 02/16/2017, 3:22 PM

## 2017-02-16 NOTE — Care Management Note (Signed)
Case Management Note  Patient Details  Name: Alexis Reese MRN: [id number] Date of Birth: 03-15-1935  Subjective/Objective:      From SNF with ankle fx.               Action/Plan: Returning to SNF today. CSW to make arrangements.   Expected Discharge Date:  02/16/17               Expected Discharge Plan:  [place]  In-House Referral:  Clinical Social Work  Discharge planning Services  NA  Post Acute Care Choice:  NA Choice offered to:  NA  Status of Service:  Completed, signed off  If discussed at [place] of Stay Meetings, dates discussed:  02/16/2017    Sherald Barge, RN 02/16/2017, 10:27 AM

## 2017-02-16 NOTE — Progress Notes (Signed)
Report called to [facility] at [facility]. Pt awaiting transport via [facility].

## 2017-02-16 NOTE — Progress Notes (Signed)
[place] discharged [facility] nursing facility per MD order.  Discharge instructions reviewed and discussed with the patient, all questions and concerns answered. Copy of instructions and scripts given to patient.  Allergies as of 02/16/2017      Reactions   Codeine    Influenza Vac Split Quad    Morphine And Related    Tuberculin Tests       Medication List    STOP taking these medications   amLODipine 2.5 MG tablet Commonly known as:  NORVASC   benazepril 20 MG tablet Commonly known as:  LOTENSIN   saccharomyces boulardii 250 MG capsule Commonly known as:  FLORASTOR   traMADol 50 MG tablet Commonly known as:  ULTRAM     TAKE these medications   acetaminophen 325 MG tablet Commonly known as:  TYLENOL Take 650 mg by mouth every 6 (six) hours as needed for mild pain or fever.   aspirin EC 81 MG tablet Take 81 mg by mouth daily.   atorvastatin 10 MG tablet Commonly known as:  LIPITOR Take 10 mg by mouth daily.   calcitonin (salmon) 200 UNIT/ACT nasal spray Commonly known as:  MIACALCIN/FORTICAL Place 1 spray into the nose daily. Alternating sids of  Nose daily   cetirizine 10 MG tablet Commonly known as:  ZYRTEC Take 5 mg daily by mouth.   clonazePAM 0.5 MG tablet Commonly known as:  KLONOPIN Take 1 tablet (0.5 mg total) 3 (three) times daily as needed by mouth for anxiety. Take one tablet by mouth every 8 hours for anxiety What changed:    how much to take  how to take this  when to take this  reasons to take this   cyanocobalamin 1000 MCG/ML injection Commonly known as:  (VITAMIN B-12) Inject 1,000 mcg into the skin every 30 (thirty) days.   divalproex 125 MG capsule Commonly known as:  DEPAKOTE SPRINKLE Take 250 mg 2 (two) times daily by mouth.   HYDROcodone-acetaminophen 5-325 MG tablet Commonly known as:  NORCO/VICODIN Take 1 tablet every 6 (six) hours as needed by mouth for moderate pain. What changed:    when to take  this  reasons to take this   insulin aspart 100 UNIT/ML injection Commonly known as:  novoLOG Inject 7 Units 3 (three) times daily before meals into the skin. For cbg >=150   insulin detemir 100 UNIT/ML injection Commonly known as:  LEVEMIR Inject 23 Units at bedtime into the skin.   latanoprost 0.005 % ophthalmic solution Commonly known as:  XALATAN Place 1 drop at bedtime into both eyes.   levothyroxine 75 MCG tablet Commonly known as:  SYNTHROID, LEVOTHROID Take 75 mcg by mouth daily before breakfast.   linagliptin 5 MG Tabs tablet Commonly known as:  TRADJENTA Take 5 mg by mouth daily.   Melatonin 3 MG Caps Take 1 capsule at bedtime by mouth.   MYRBETRIQ 25 MG Tb24 tablet Generic drug:  mirabegron ER Take 25 mg daily by mouth.   nystatin powder Generic drug:  nystatin Apply daily topically. To abdominal skin folds   [place] calcium 500 MG Tabs tablet Take 500 mg of elemental calcium 2 (two) times daily by mouth.   pantoprazole 40 MG tablet Commonly known as:  PROTONIX Take 40 mg daily by mouth.   QUEtiapine 25 MG tablet Commonly known as:  SEROQUEL Take 25 mg 3 (three) times daily by mouth.   sennosides-docusate sodium 8.6-50 MG tablet Commonly known as:  SENOKOT-S Take 2 tablets  2 (two) times daily by mouth.   sertraline 50 MG tablet Commonly known as:  ZOLOFT Take 50 mg daily by mouth.   SYSTANE 0.4-0.3 % Soln Generic drug:  Polyethyl Glycol-Propyl Glycol Apply 1 drop 2 (two) times daily to eye.   vitamin A & D ointment Apply 1 application every other day topically. To vaginal opening   Vitamin D (Ergocalciferol) 50000 units Caps capsule Commonly known as:  DRISDOL Take 50,000 Units every 30 (thirty) days by mouth.        IV site discontinued and catheter remains intact. Site without signs and symptoms of complications. Dressing and pressure applied.  Patient transported to [place].  Ralene Muskrat Jamond Neels 02/16/2017 4:53 PM

## 2017-02-16 NOTE — Progress Notes (Signed)
OT Cancellation Note  Patient Details Name: Alexis Reese MRN: [id number] DOB: 26-Oct-1934   Cancelled Treatment:    Reason Eval/Treat Not Completed: Fatigue/lethargy limiting ability to participate. Pt unable to awaken for evaluation, mumbling incoherently and going back to sleep. Will attempt evaluation again at a later time.   Guadelupe Sabin, OTR/L  [phone] 02/16/2017, 9:24 AM

## 2017-02-16 NOTE — Progress Notes (Signed)
Pt took several morning meds whole in oatmeal this morning with much encouragement but then became agitated and started spitting pills out and yelling for this nurse to leave her alone and get out of room. Meds given/not given documented on MAR.

## 2017-02-16 NOTE — Discharge Summary (Addendum)
Physician Discharge Summary  Alexis Reese AST:[id number] DOB: 1934-08-23 DOA: 02/11/2017  PCP: No primary care provider on file.  Admit date: 02/11/2017 Discharge date: 02/16/2017  Admitted From: SNF Disposition:  SNF  Recommendations for Outpatient Follow-up:  1. Follow up with PCP in 1-2 weeks 2. Please obtain BMP/CBC in one week    Discharge Condition: Stable CODE STATUS: FULL Diet recommendation: Heart Healthy / Carb Modified    Brief/Interim Summary: 82 year old female with a history of dementia, diabetes mellitus type 2, hypertension, stroke, hyperlipidemia, depression presenting after mechanical fall at her skilled nursing facility. Patient is unable to provide any significant history secondary to her cognitive impairment. There was no loss of consciousness. The patient did not hit her head. X-rays of her right ankle revealed a closed trimalleolar fracture with subluxation. Orthopedics was consulted. Dr. Aline Brochure to the patient the surgery and performed ORIF of her trimalleolar fracture. Postoperatively, the patient became agitated and exhibited delirium. Patient was started on as needed Haldol.    Discharge Diagnoses:  Right ankle trimalleolar fracture -Appreciate orthopedics -02/12/17--Status post ORIF right ankle fracture -PT eval-->SNF -pain control  Acute Respiratory Failure with hypoxia/Pulmonary Edema -developed after fluid resuscitation after surgery -oxygen sat 85% on RA-->98% on 3L -11/11-personally reviewed CXR--pulm vascular congestion -wean back to room air with saturation 97-98% on RA--I personally checked on day of d/c -lasix 40 mg IV on 11/11 and 11/12 -repeat cxr am 11/13--personally reviewed--no pulmonary edema, improved vs 11/11 -saline locked IVF -Echo--EF 65-70%, grade 1 DD, no WMA  Fever -had 101.7 fever 11/10 evening -remained afebrile x 48 hours prior to d/c -CXR--no consolidation -blood cultures--neg to date -UA--neg  pyuria -LGXQJJHERDEYC--<1.44  Acute Metabolic Encephalopathy -likely due to opioids, ativan and hospital delirium -d/c seroquel, fentanyl, clonazepam-->improved, more alert -minimize opioids and benzos -UA--neg pyuria -Haldol prn agitation -Serum B12--1005 -TSH--2.982 -ammonia--23  Diabetes mellitus type 2 -02/11/17--Hemoglobin A1c 7.0 -NovoLog sliding scale -Holdingtradjenta-->restart after d/c  Essential hypertension -Holding amlodipine and benazepril secondary to soft blood pressure--will not restart -BP remains well controlled  Sinus tachycardia -Secondary to acute medical condition, pain, and a degree of volume depletion -Monitor clinically --improved  -Personally reviewed EKG--sinus rhythm, nonspecific T wave changes  Thrombocytopenia -Suspect related to medications -worsening due to acute medical illness -Serum B12--1005 -TSH--2.982  Dementia with behavioral disturbance -Continue Depakote, Seroquel -haldol prn agitation  Hyperlipidemia -Continue statin  History of stroke -Continue aspirin and statin  Hypothyroidism -Continue Synthroid -Check TSH--2.982      Discharge Instructions  Discharge Instructions    Diet Carb Modified   Complete by:  As directed    Increase activity slowly   Complete by:  As directed      Allergies as of 02/16/2017      Reactions   Codeine    Influenza Vac Split Quad    Morphine And Related    Tuberculin Tests       Medication List    STOP taking these medications   amLODipine 2.5 MG tablet Commonly known as:  NORVASC   benazepril 20 MG tablet Commonly known as:  LOTENSIN   saccharomyces boulardii 250 MG capsule Commonly known as:  FLORASTOR   traMADol 50 MG tablet Commonly known as:  ULTRAM     TAKE these medications   acetaminophen 325 MG tablet Commonly known as:  TYLENOL Take 650 mg by mouth every 6 (six) hours as needed for mild pain or fever.   aspirin EC 81 MG tablet Take 81 mg  by mouth daily.  atorvastatin 10 MG tablet Commonly known as:  LIPITOR Take 10 mg by mouth daily.   calcitonin (salmon) 200 UNIT/ACT nasal spray Commonly known as:  MIACALCIN/FORTICAL Place 1 spray into the nose daily. Alternating sids of  Nose daily   cetirizine 10 MG tablet Commonly known as:  ZYRTEC Take 5 mg daily by mouth.   clonazePAM 0.5 MG tablet Commonly known as:  KLONOPIN Take 1 tablet (0.5 mg total) 3 (three) times daily as needed by mouth for anxiety. Take one tablet by mouth every 8 hours for anxiety What changed:    how much to take  how to take this  when to take this  reasons to take this   cyanocobalamin 1000 MCG/ML injection Commonly known as:  (VITAMIN B-12) Inject 1,000 mcg into the skin every 30 (thirty) days.   divalproex 125 MG capsule Commonly known as:  DEPAKOTE SPRINKLE Take 250 mg 2 (two) times daily by mouth.   HYDROcodone-acetaminophen 5-325 MG tablet Commonly known as:  NORCO/VICODIN Take 1 tablet every 6 (six) hours as needed by mouth for moderate pain. What changed:    when to take this  reasons to take this   insulin aspart 100 UNIT/ML injection Commonly known as:  novoLOG Inject 7 Units 3 (three) times daily before meals into the skin. For cbg >=150   insulin detemir 100 UNIT/ML injection Commonly known as:  LEVEMIR Inject 23 Units at bedtime into the skin.   latanoprost 0.005 % ophthalmic solution Commonly known as:  XALATAN Place 1 drop at bedtime into both eyes.   levothyroxine 75 MCG tablet Commonly known as:  SYNTHROID, LEVOTHROID Take 75 mcg by mouth daily before breakfast.   linagliptin 5 MG Tabs tablet Commonly known as:  TRADJENTA Take 5 mg by mouth daily.   Melatonin 3 MG Caps Take 1 capsule at bedtime by mouth.   MYRBETRIQ 25 MG Tb24 tablet Generic drug:  mirabegron ER Take 25 mg daily by mouth.   nystatin powder Generic drug:  nystatin Apply daily topically. To abdominal skin folds   oyster  calcium 500 MG Tabs tablet Take 500 mg of elemental calcium 2 (two) times daily by mouth.   pantoprazole 40 MG tablet Commonly known as:  PROTONIX Take 40 mg daily by mouth.   QUEtiapine 25 MG tablet Commonly known as:  SEROQUEL Take 25 mg 3 (three) times daily by mouth.   sennosides-docusate sodium 8.6-50 MG tablet Commonly known as:  SENOKOT-S Take 2 tablets 2 (two) times daily by mouth.   sertraline 50 MG tablet Commonly known as:  ZOLOFT Take 50 mg daily by mouth.   SYSTANE 0.4-0.3 % Soln Generic drug:  Polyethyl Glycol-Propyl Glycol Apply 1 drop 2 (two) times daily to eye.   vitamin A & D ointment Apply 1 application every other day topically. To vaginal opening   Vitamin D (Ergocalciferol) 50000 units Caps capsule Commonly known as:  DRISDOL Take 50,000 Units every 30 (thirty) days by mouth.      Follow-up Information    Carole Civil, MD Follow up in 2 day(s).   Specialties:  Orthopedic Surgery, Radiology Contact information: [address] [place] [address] [phone]          Allergies  Allergen Reactions  . Codeine   . Influenza Vac Split Quad   . Morphine And Related   . Tuberculin Tests     Consultations:  Ortho--[facility]   Procedures/Studies: Dg Chest 2 View  Result Date: 02/11/2017 CLINICAL DATA:  Preoperative screening. Lower leg fracture after a fall today. History of diabetes, hypertension, gastroesophageal reflux disease, stroke, cancer. EXAM: CHEST  2 VIEW COMPARISON:  05/17/2013 FINDINGS: Shallow inspiration with elevation of the right hemidiaphragm. Heart size and pulmonary vascularity are normal. No airspace disease or consolidation in the lungs. No blunting of costophrenic angles. No pneumothorax. Surgical clips in the right axilla. Compression of midthoracic and upper lumbar vertebrae, as seen on previous chest CT from 05/20/2013 IMPRESSION: No active cardiopulmonary disease. Electronically Signed   By: Lucienne Capers M.D.   On: 02/11/2017 22:46   Dg Tibia/fibula Right  Result Date: 02/11/2017 CLINICAL DATA:  Patient status post fall. EXAM: RIGHT TIBIA AND FIBULA - 2 VIEW COMPARISON:  None. FINDINGS: Comminuted oblique fracture through the distal fibula. Comminuted medial malleolus fracture. Dislocation of the talus. Overlying soft tissue swelling about the ankle. No evidence for acute fracture of the proximal tibia or fibula. Knee joint degenerative changes. IMPRESSION: See dedicated ankle radiographs for description of distal fibula fracture, medial malleolus fracture and talar dislocation. No evidence for proximal tibial or fibular fracture. Electronically Signed   By: Lovey Newcomer M.D.   On: 02/11/2017 20:14   Dg Ankle Complete Right  Result Date: 02/12/2017 CLINICAL DATA:  Trimalleolar right ankle fracture EXAM: RIGHT ANKLE - COMPLETE 3+ VIEW COMPARISON:  02/11/2017 FLUOROSCOPY TIME:  Radiation Exposure Index (as provided by the fluoroscopic device): 2.05 mGy If the device does not provide the exposure index: Fluoroscopy Time:  30 seconds Number of Acquired Images:  10 FINDINGS: Initial images again demonstrate the trimalleolar fracture which has been significantly reduced. Fixation sideplate is subsequently noted along the distal fibula with the fracture fragments in anatomic alignment. Fixation screw and wire are noted traversing the medial malleolus with the fracture fragments in anatomic alignment. IMPRESSION: Status post ORIF of right ankle fracture Electronically Signed   By: Inez Catalina M.D.   On: 02/12/2017 12:15   Dg Ankle Complete Right  Result Date: 02/11/2017 CLINICAL DATA:  Patient fell and presents with right ankle pain. EXAM: RIGHT ANKLE - COMPLETE 3+ VIEW COMPARISON:  None. FINDINGS: Acute, closed, trimalleolar fracture-subluxation of the right ankle is noted. A transverse fracture through the medial malleolus, coronal fracture through posterior malleolus and comminuted coronal oblique  fracture of the distal diaphysis through metadiaphysis of the fibula with extension into the distal tibiofibular syndesmosis is noted. There is widening of the medial clear space of the ankle mortise by 13 mm. There is marked soft tissue swelling about the malleoli. The subtalar and midfoot articulations appear congruent. No calcaneal fracture. Intact base of fifth metatarsal. IMPRESSION: Acute, closed, trimalleolar fracture - subluxation of the right ankle as above described with associated marked soft tissue swelling. Widening of the medial clear space of the ankle mortise by 13 mm due to the fracture-subluxation. Electronically Signed   By: Ashley Royalty M.D.   On: 02/11/2017 20:15   Dg Chest Port 1 View  Result Date: 02/16/2017 CLINICAL DATA:  Pulmonary edema EXAM: PORTABLE CHEST 1 VIEW COMPARISON:  02/14/2017 FINDINGS: Cardiomegaly with vascular congestion and mild interstitial prominence, similar prior study, compatible with edema. Bibasilar atelectasis. No effusions. No acute bony abnormality. IMPRESSION: Mild edema/ CHF.  No change. Electronically Signed   By: Rolm Baptise M.D.   On: 02/16/2017 08:21   Dg Chest Port 1 View  Result Date: 02/14/2017 CLINICAL DATA:  Postop ankle surgery EXAM: PORTABLE CHEST 1 VIEW COMPARISON:  02/11/2017 FINDINGS: Enlarged cardiac silhouette. Low lung volumes. Central  venous congestion. Perihilar airspace disease. IMPRESSION: Findings most suggestive of congestive heart failure with mild edema and low lung volumes Electronically Signed   By: Suzy Bouchard M.D.   On: 02/14/2017 16:22   Dg C-arm 1-60 Min-no Report  Result Date: 02/12/2017 Fluoroscopy was utilized by the requesting physician.  No radiographic interpretation.   Dg Hip Unilat W Or Wo Pelvis 2-3 Views Right  Result Date: 02/11/2017 CLINICAL DATA:  Fall.  Pain. EXAM: DG HIP (WITH OR WITHOUT PELVIS) 2-3V RIGHT COMPARISON:  None. FINDINGS: There is no evidence of hip fracture or dislocation. There is  no evidence of arthropathy or other focal bone abnormality. IMPRESSION: Negative. Electronically Signed   By: Misty Stanley M.D.   On: 02/11/2017 20:14        Discharge Exam: Vitals:   02/15/17 2126 02/16/17 0439  BP:  130/68  Pulse:  80  Resp:  20  Temp:  98.2 F (36.8 C)  SpO2: 95% 96%   Vitals:   02/15/17 2028 02/15/17 2125 02/15/17 2126 02/16/17 0439  BP: 119/74   130/68  Pulse: 84   80  Resp: 20   20  Temp: 98.8 F (37.1 C)   98.2 F (36.8 C)  TempSrc: Axillary   Axillary  SpO2: 100% (!) 82% 95% 96%  Weight:    83 kg (182 lb 15.7 oz)  Height:        General: Pt is alert, awake, not in acute distress Cardiovascular: RRR, S1/S2 +, no rubs, no gallops Respiratory: CTA bilaterally, no wheezing, no rhonchi Abdominal: Soft, NT, ND, bowel sounds + Extremities: no edema, no cyanosis   The results of significant diagnostics from this hospitalization (including imaging, microbiology, ancillary and laboratory) are listed below for reference.    Significant Diagnostic Studies: Dg Chest 2 View  Result Date: 02/11/2017 CLINICAL DATA:  Preoperative screening. Lower leg fracture after a fall today. History of diabetes, hypertension, gastroesophageal reflux disease, stroke, cancer. EXAM: CHEST  2 VIEW COMPARISON:  05/17/2013 FINDINGS: Shallow inspiration with elevation of the right hemidiaphragm. Heart size and pulmonary vascularity are normal. No airspace disease or consolidation in the lungs. No blunting of costophrenic angles. No pneumothorax. Surgical clips in the right axilla. Compression of midthoracic and upper lumbar vertebrae, as seen on previous chest CT from 05/20/2013 IMPRESSION: No active cardiopulmonary disease. Electronically Signed   By: Lucienne Capers M.D.   On: 02/11/2017 22:46   Dg Tibia/fibula Right  Result Date: 02/11/2017 CLINICAL DATA:  Patient status post fall. EXAM: RIGHT TIBIA AND FIBULA - 2 VIEW COMPARISON:  None. FINDINGS: Comminuted oblique fracture  through the distal fibula. Comminuted medial malleolus fracture. Dislocation of the talus. Overlying soft tissue swelling about the ankle. No evidence for acute fracture of the proximal tibia or fibula. Knee joint degenerative changes. IMPRESSION: See dedicated ankle radiographs for description of distal fibula fracture, medial malleolus fracture and talar dislocation. No evidence for proximal tibial or fibular fracture. Electronically Signed   By: Lovey Newcomer M.D.   On: 02/11/2017 20:14   Dg Ankle Complete Right  Result Date: 02/12/2017 CLINICAL DATA:  Trimalleolar right ankle fracture EXAM: RIGHT ANKLE - COMPLETE 3+ VIEW COMPARISON:  02/11/2017 FLUOROSCOPY TIME:  Radiation Exposure Index (as provided by the fluoroscopic device): 2.05 mGy If the device does not provide the exposure index: Fluoroscopy Time:  30 seconds Number of Acquired Images:  10 FINDINGS: Initial images again demonstrate the trimalleolar fracture which has been significantly reduced. Fixation sideplate is subsequently noted along the  distal fibula with the fracture fragments in anatomic alignment. Fixation screw and wire are noted traversing the medial malleolus with the fracture fragments in anatomic alignment. IMPRESSION: Status post ORIF of right ankle fracture Electronically Signed   By: Inez Catalina M.D.   On: 02/12/2017 12:15   Dg Ankle Complete Right  Result Date: 02/11/2017 CLINICAL DATA:  Patient fell and presents with right ankle pain. EXAM: RIGHT ANKLE - COMPLETE 3+ VIEW COMPARISON:  None. FINDINGS: Acute, closed, trimalleolar fracture-subluxation of the right ankle is noted. A transverse fracture through the medial malleolus, coronal fracture through posterior malleolus and comminuted coronal oblique fracture of the distal diaphysis through metadiaphysis of the fibula with extension into the distal tibiofibular syndesmosis is noted. There is widening of the medial clear space of the ankle mortise by 13 mm. There is marked  soft tissue swelling about the malleoli. The subtalar and midfoot articulations appear congruent. No calcaneal fracture. Intact base of fifth metatarsal. IMPRESSION: Acute, closed, trimalleolar fracture - subluxation of the right ankle as above described with associated marked soft tissue swelling. Widening of the medial clear space of the ankle mortise by 13 mm due to the fracture-subluxation. Electronically Signed   By: Ashley Royalty M.D.   On: 02/11/2017 20:15   Dg Chest Port 1 View  Result Date: 02/16/2017 CLINICAL DATA:  Pulmonary edema EXAM: PORTABLE CHEST 1 VIEW COMPARISON:  02/14/2017 FINDINGS: Cardiomegaly with vascular congestion and mild interstitial prominence, similar prior study, compatible with edema. Bibasilar atelectasis. No effusions. No acute bony abnormality. IMPRESSION: Mild edema/ CHF.  No change. Electronically Signed   By: Rolm Baptise M.D.   On: 02/16/2017 08:21   Dg Chest Port 1 View  Result Date: 02/14/2017 CLINICAL DATA:  Postop ankle surgery EXAM: PORTABLE CHEST 1 VIEW COMPARISON:  02/11/2017 FINDINGS: Enlarged cardiac silhouette. Low lung volumes. Central venous congestion. Perihilar airspace disease. IMPRESSION: Findings most suggestive of congestive heart failure with mild edema and low lung volumes Electronically Signed   By: Suzy Bouchard M.D.   On: 02/14/2017 16:22   Dg C-arm 1-60 Min-no Report  Result Date: 02/12/2017 Fluoroscopy was utilized by the requesting physician.  No radiographic interpretation.   Dg Hip Unilat W Or Wo Pelvis 2-3 Views Right  Result Date: 02/11/2017 CLINICAL DATA:  Fall.  Pain. EXAM: DG HIP (WITH OR WITHOUT PELVIS) 2-3V RIGHT COMPARISON:  None. FINDINGS: There is no evidence of hip fracture or dislocation. There is no evidence of arthropathy or other focal bone abnormality. IMPRESSION: Negative. Electronically Signed   By: Misty Stanley M.D.   On: 02/11/2017 20:14     Microbiology: Recent Results (from the past 240 hour(s))  MRSA  PCR Screening     Status: None   Collection Time: 02/12/17 12:18 AM  Result Value Ref Range Status   MRSA by PCR NEGATIVE NEGATIVE Final    Comment:        The GeneXpert MRSA Assay (FDA approved for NASAL specimens only), is one component of a comprehensive MRSA colonization surveillance program. It is not intended to diagnose MRSA infection nor to guide or monitor treatment for MRSA infections.   Surgical PCR screen     Status: None   Collection Time: 02/12/17 12:18 AM  Result Value Ref Range Status   MRSA, PCR NEGATIVE NEGATIVE Final   Staphylococcus aureus NEGATIVE NEGATIVE Final    Comment: (NOTE) The Xpert SA Assay (FDA approved for NASAL specimens in patients 55 years of age and older), is one component of  a comprehensive surveillance program. It is not intended to diagnose infection nor to guide or monitor treatment.   Culture, Urine     Status: None   Collection Time: 02/13/17  2:15 PM  Result Value Ref Range Status   Specimen Description URINE, CATHETERIZED  Final   Special Requests NONE  Final   Culture   Final    NO GROWTH Performed at [facility], [address] [place], [place] [address]    Report Status 02/14/2017 FINAL  Final  Culture, blood (Routine X 2) w Reflex to ID Panel     Status: None (Preliminary result)   Collection Time: 02/15/17  5:26 AM  Result Value Ref Range Status   Specimen Description BLOOD LEFT ARM  Final   Special Requests   Final    BOTTLES DRAWN AEROBIC AND ANAEROBIC Blood Culture results may not be optimal due to an inadequate volume of blood received in culture bottles   Culture NO GROWTH 1 DAY  Final   Report Status PENDING  Incomplete  Culture, blood (Routine X 2) w Reflex to ID Panel     Status: None (Preliminary result)   Collection Time: 02/15/17  5:40 AM  Result Value Ref Range Status   Specimen Description BLOOD RIGHT HAND  Final   Special Requests   Final    BOTTLES DRAWN AEROBIC AND ANAEROBIC Blood Culture  adequate volume   Culture NO GROWTH 1 DAY  Final   Report Status PENDING  Incomplete     Labs: Basic Metabolic Panel: Recent Labs  Lab 02/11/17 2035 02/13/17 0618 02/14/17 0716 02/16/17 0604  NA 138 134* 138 139  K 4.6 4.2 3.9 3.3*  CL 97* 97* 101 97*  CO2 31 30 29 31   GLUCOSE 249* 227* 176* 161*  BUN 20 13 12 17   CREATININE 0.91 0.85 0.87 0.82  CALCIUM 9.5 8.2* 8.3* 8.5*   Liver Function Tests: No results for input(s): AST, ALT, ALKPHOS, BILITOT, PROT, ALBUMIN in the last 168 hours. No results for input(s): LIPASE, AMYLASE in the last 168 hours. Recent Labs  Lab 02/14/17 0716  AMMONIA 23   CBC: Recent Labs  Lab 02/11/17 2035 02/12/17 1455 02/13/17 0618 02/14/17 0716 02/15/17 0525 02/16/17 0604  WBC 9.5 8.4 9.6 8.2 7.2 7.0  NEUTROABS 7.8*  --   --   --   --   --   HGB 14.0 12.1 11.3* 10.6* 10.0* 11.1*  HCT 42.2 37.0 33.9* 32.4* 30.4* 33.6*  MCV 94.2 95.9 94.2 95.3 95.3 93.9  PLT 141* 131* 112* 119* 129* 163   Cardiac Enzymes: No results for input(s): CKTOTAL, CKMB, CKMBINDEX, TROPONINI in the last 168 hours. BNP: Invalid input(s): POCBNP CBG: Recent Labs  Lab 02/15/17 1657 02/15/17 2019 02/16/17 0049 02/16/17 0432 02/16/17 0804  GLUCAP 223* 94 145* 147* 158*    Time coordinating discharge:  Greater than 30 minutes  Signed:  Doran Nestle, DO [facility] Pager: [phone] 02/16/2017, 10:23 AM

## 2017-02-20 LAB — CULTURE, BLOOD (ROUTINE X 2)
CULTURE: NO GROWTH
CULTURE: NO GROWTH
Special Requests: ADEQUATE

## 2017-03-03 ENCOUNTER — Ambulatory Visit (INDEPENDENT_AMBULATORY_CARE_PROVIDER_SITE_OTHER): Payer: Self-pay | Admitting: Orthopedic Surgery

## 2017-03-03 ENCOUNTER — Encounter: Payer: Self-pay | Admitting: Orthopedic Surgery

## 2017-03-03 ENCOUNTER — Ambulatory Visit (INDEPENDENT_AMBULATORY_CARE_PROVIDER_SITE_OTHER): Payer: Medicare Other

## 2017-03-03 VITALS — BP 142/102 | HR 97 | Resp 16

## 2017-03-03 DIAGNOSIS — S82851D Displaced trimalleolar fracture of right lower leg, subsequent encounter for closed fracture with routine healing: Secondary | ICD-10-CM | POA: Diagnosis not present

## 2017-03-03 DIAGNOSIS — Z8781 Personal history of (healed) traumatic fracture: Secondary | ICD-10-CM | POA: Insufficient documentation

## 2017-03-03 DIAGNOSIS — Z9889 Other specified postprocedural states: Secondary | ICD-10-CM

## 2017-03-03 DIAGNOSIS — Z967 Presence of other bone and tendon implants: Secondary | ICD-10-CM

## 2017-03-03 NOTE — Progress Notes (Signed)
Chief Complaint  Patient presents with  . Post-op Follow-up    right ankle 02/12/17    Encounter Diagnoses  Name Primary?  . Closed trimalleolar fracture of right ankle with routine healing, subsequent encounter Yes  . S/P ORIF (open reduction internal fixation) fracture 02/12/17     Surgical wounds are clean dry and intact staples were removed  X-rays show fracture reduction stable hardware  Patient placed in a Cam walker no weightbearing.  Come back in 4 weeks repeat x-ray

## 2017-04-09 ENCOUNTER — Ambulatory Visit (INDEPENDENT_AMBULATORY_CARE_PROVIDER_SITE_OTHER): Payer: Medicare Other

## 2017-04-09 ENCOUNTER — Encounter: Payer: Self-pay | Admitting: Orthopedic Surgery

## 2017-04-09 ENCOUNTER — Ambulatory Visit (INDEPENDENT_AMBULATORY_CARE_PROVIDER_SITE_OTHER): Payer: Medicare Other | Admitting: Orthopedic Surgery

## 2017-04-09 VITALS — BP 141/104 | HR 100 | Resp 18

## 2017-04-09 DIAGNOSIS — Z9889 Other specified postprocedural states: Secondary | ICD-10-CM

## 2017-04-09 DIAGNOSIS — Z967 Presence of other bone and tendon implants: Secondary | ICD-10-CM

## 2017-04-09 DIAGNOSIS — Z8781 Personal history of (healed) traumatic fracture: Secondary | ICD-10-CM

## 2017-04-09 DIAGNOSIS — S82851D Displaced trimalleolar fracture of right lower leg, subsequent encounter for closed fracture with routine healing: Secondary | ICD-10-CM

## 2017-04-09 NOTE — Progress Notes (Signed)
POST OP   Encounter Diagnoses  Name Primary?  . S/P ORIF (open reduction internal fixation) fracture 02/12/17 Yes  . Closed trimalleolar fracture of right ankle with routine healing, subsequent encounter     Postop day 56 (8 weeks) status post open reduction internal fixation trimalleolar fracture with medial and lateral fixation  X-rays show fracture is maintained in position may be some fibrous union of the medial displacement of the fracture fragments or hardware  Radiology report as dictated by Dr. Aline Brochure  3 views of the right ankle  Right ankle fracture status post internal fixation  X-ray shows a lateral plate multiple screws including interfrag screw and 2 medial fixation devices one screw 1 K wire  Ankle mortise is intact fractures are reduced.  The separation of the medial malleolar fracture fragment does not appear to be unexpected or an issue at this time we will continue to monitor with x-ray and clinical exam  Impression stable fixation right ankle.  There should be noted that there is a significant amount of osteopenia throughout the foot and ankle  Patient is in a Cam walking boot nonweightbearing.  He x-ray again in 4 weeks continue nonweightbearing

## 2017-04-09 NOTE — Patient Instructions (Signed)
Postop day 56 (8 weeks) status post open reduction internal fixation trimalleolar fracture with medial and lateral fixation  X-rays show fracture is maintained in position may be some fibrous union of the medial displacement of the fracture fragments or hardware  Radiology report as dictated by Dr. Aline Brochure  3 views of the right ankle  Right ankle fracture status post internal fixation  X-ray shows a lateral plate multiple screws including interfrag screw and 2 medial fixation devices one screw 1 K wire  Ankle mortise is intact fractures are reduced.  The separation of the medial malleolar fracture fragment does not appear to be unexpected or an issue at this time we will continue to monitor with x-ray and clinical exam  Impression stable fixation right ankle.  There should be noted that there is a significant amount of osteopenia throughout the foot and ankle  Patient is in a Cam walking boot nonweightbearing.  He x-ray again in 4 weeks continue nonweightbearing

## 2017-05-07 ENCOUNTER — Encounter: Payer: Self-pay | Admitting: Orthopedic Surgery

## 2017-05-07 ENCOUNTER — Ambulatory Visit (INDEPENDENT_AMBULATORY_CARE_PROVIDER_SITE_OTHER): Payer: Medicare Other

## 2017-05-07 ENCOUNTER — Ambulatory Visit (INDEPENDENT_AMBULATORY_CARE_PROVIDER_SITE_OTHER): Payer: Self-pay | Admitting: Orthopedic Surgery

## 2017-05-07 VITALS — BP 122/81 | HR 94

## 2017-05-07 DIAGNOSIS — Z8781 Personal history of (healed) traumatic fracture: Secondary | ICD-10-CM

## 2017-05-07 DIAGNOSIS — Z967 Presence of other bone and tendon implants: Secondary | ICD-10-CM | POA: Diagnosis not present

## 2017-05-07 DIAGNOSIS — Z9889 Other specified postprocedural states: Secondary | ICD-10-CM

## 2017-05-07 NOTE — Progress Notes (Signed)
POST OP VISIT   Patient ID: Alexis Reese, female   DOB: 05-06-1934, 83 y.o.   MRN: [id number]  Chief Complaint  Patient presents with  . Follow-up    Recheck on right ankle fracture, DOI 02-12-17.    Encounter Diagnosis  Name Primary?  . S/P ORIF (open reduction internal fixation) fracture 02/12/17 Yes   84 days after open reduction internal fixation of bimalleolar/trimalleolar fracture  Patient's x-rays show fracture healing laterally with some radiolucency medially which is most likely fibrous union  She complains of dorsal foot pain and she has some tenderness and swelling there but her ankle range of motion is approximate 25 degrees total  Her incisions are healed her x-ray looks good enough to start weightbearing as tolerated in the boot follow-up in 4 weeks

## 2017-06-09 ENCOUNTER — Ambulatory Visit: Payer: Medicare Other | Admitting: Orthopedic Surgery

## 2017-06-09 ENCOUNTER — Encounter: Payer: Self-pay | Admitting: Orthopedic Surgery

## 2017-07-01 ENCOUNTER — Encounter: Payer: Self-pay | Admitting: Orthopedic Surgery

## 2017-07-01 ENCOUNTER — Ambulatory Visit (INDEPENDENT_AMBULATORY_CARE_PROVIDER_SITE_OTHER): Payer: Medicare Other | Admitting: Orthopedic Surgery

## 2017-07-01 VITALS — BP 144/80 | HR 81 | Ht 63.0 in

## 2017-07-01 DIAGNOSIS — S82851D Displaced trimalleolar fracture of right lower leg, subsequent encounter for closed fracture with routine healing: Secondary | ICD-10-CM

## 2017-07-01 DIAGNOSIS — R609 Edema, unspecified: Secondary | ICD-10-CM

## 2017-07-01 NOTE — Progress Notes (Signed)
Progress Note   Patient ID: Alexis Reese, female   DOB: 1934/04/22, 83 y.o.   MRN: [id number]  Chief Complaint  Patient presents with  . Ankle Injury    ORIF Ankle 02/12/17     83 year old female had a trimalleolar ankle fracture I kept her in the boot a little longer based on her osteopenia and age she comes in today as scheduled for reevaluation and determination of the cam walking brace can be removed    ROS Current Meds  Medication Sig  . acetaminophen (TYLENOL) 325 MG tablet Take 650 mg by mouth every 6 (six) hours as needed for mild pain or fever.  Marland Kitchen aspirin EC 81 MG tablet Take 81 mg by mouth daily.  Marland Kitchen atorvastatin (LIPITOR) 10 MG tablet Take 10 mg by mouth daily.  . calcitonin, salmon, (MIACALCIN/FORTICAL) 200 UNIT/ACT nasal spray Place 1 spray into the nose daily. Alternating sids of  Nose daily  . cetirizine (ZYRTEC) 10 MG tablet Take 5 mg daily by mouth.   . clonazePAM (KLONOPIN) 0.5 MG tablet Take 1 tablet (0.5 mg total) 3 (three) times daily as needed by mouth for anxiety. Take one tablet by mouth every 8 hours for anxiety  . cyanocobalamin (,VITAMIN B-12,) 1000 MCG/ML injection Inject 1,000 mcg into the skin every 30 (thirty) days.  . divalproex (DEPAKOTE SPRINKLE) 125 MG capsule Take 250 mg 2 (two) times daily by mouth.   Marland Kitchen HYDROcodone-acetaminophen (NORCO/VICODIN) 5-325 MG tablet Take 1 tablet every 6 (six) hours as needed by mouth for moderate pain.  Marland Kitchen insulin aspart (NOVOLOG) 100 UNIT/ML injection Inject 7 Units 3 (three) times daily before meals into the skin. For cbg >=150   . insulin detemir (LEVEMIR) 100 UNIT/ML injection Inject 23 Units at bedtime into the skin.   Marland Kitchen latanoprost (XALATAN) 0.005 % ophthalmic solution Place 1 drop at bedtime into both eyes.  Marland Kitchen levothyroxine (SYNTHROID, LEVOTHROID) 75 MCG tablet Take 75 mcg by mouth daily before breakfast.   . linagliptin (TRADJENTA) 5 MG TABS tablet Take 5 mg by mouth daily.  . Melatonin 3 MG CAPS Take 1 capsule at  bedtime by mouth.   . mirabegron ER (MYRBETRIQ) 25 MG TB24 tablet Take 25 mg daily by mouth.  . nystatin (NYSTATIN) powder Apply daily topically. To abdominal skin folds  . Oyster Shell (OYSTER CALCIUM) 500 MG TABS tablet Take 500 mg of elemental calcium 2 (two) times daily by mouth.  . pantoprazole (PROTONIX) 40 MG tablet Take 40 mg daily by mouth.  Vladimir Faster Glycol-Propyl Glycol (SYSTANE) 0.4-0.3 % SOLN Apply 1 drop 2 (two) times daily to eye.  Marland Kitchen QUEtiapine (SEROQUEL) 25 MG tablet Take 25 mg 3 (three) times daily by mouth.   . sennosides-docusate sodium (SENOKOT-S) 8.6-50 MG tablet Take 2 tablets 2 (two) times daily by mouth.   . sertraline (ZOLOFT) 50 MG tablet Take 50 mg daily by mouth.  . sitaGLIPtin (JANUVIA) 100 MG tablet Take by mouth.  . Vitamin D, Ergocalciferol, (DRISDOL) 50000 units CAPS capsule Take 50,000 Units every 30 (thirty) days by mouth.  . Vitamins A & D (VITAMIN A & D) ointment Apply 1 application every other day topically. To vaginal opening    Allergies  Allergen Reactions  . Codeine   . Influenza Vac Split Quad   . Morphine And Related   . Tuberculin Tests      BP (!) 144/80   Pulse 81   Ht 5\' 3"  (1.6 m)   BMI 32.41 kg/m  Physical Exam  Musculoskeletal:       Feet:     Medical decision-making Encounter Diagnoses  Name Primary?  . Closed trimalleolar fracture of right ankle with routine healing, subsequent encounter Yes  . Edema, peripheral    released her with the TED hose to wear for 6 weeks    Arther Abbott, MD 07/01/2017 2:12 PM

## 2017-07-01 NOTE — Patient Instructions (Addendum)
Remove brace wbat  Wear ted hose x 6 weeks

## 2017-10-07 ENCOUNTER — Other Ambulatory Visit
Admission: RE | Admit: 2017-10-07 | Discharge: 2017-10-07 | Disposition: A | Discharge status: Home / Self Care | Payer: Self-pay | Source: Skilled Nursing Facility | Attending: Internal Medicine | Admitting: Internal Medicine

## 2017-10-07 DIAGNOSIS — Z79899 Other long term (current) drug therapy: Secondary | ICD-10-CM | POA: Insufficient documentation

## 2017-10-07 LAB — CBC WITH DIFFERENTIAL/PLATELET
Basophils Absolute: 0 10*3/uL (ref 0–0.1)
Basophils Relative: 0 %
Eosinophils Absolute: 0 10*3/uL (ref 0–0.7)
Eosinophils Relative: 0 %
HCT: 40.4 % (ref 35.0–47.0)
Hemoglobin: 13.8 g/dL (ref 12.0–16.0)
Lymphocytes Relative: 7 %
Lymphs Abs: 0.8 10*3/uL — ABNORMAL LOW (ref 1.0–3.6)
MCH: 31.1 pg (ref 26.0–34.0)
MCHC: 34 g/dL (ref 32.0–36.0)
MCV: 91.5 fL (ref 80.0–100.0)
Monocytes Absolute: 0.9 10*3/uL (ref 0.2–0.9)
Monocytes Relative: 8 %
Neutro Abs: 10.5 10*3/uL — ABNORMAL HIGH (ref 1.4–6.5)
Neutrophils Relative %: 85 %
Platelets: 150 10*3/uL (ref 150–440)
RBC: 4.42 MIL/uL (ref 3.80–5.20)
RDW: 12.8 % (ref 11.5–14.5)
WBC: 12.3 10*3/uL — ABNORMAL HIGH (ref 3.6–11.0)

## 2018-12-09 IMAGING — DX DG CHEST 2V
2 series · 2 of 2 positions shown · non-contrast
Comparison: 05/17/2013

CLINICAL DATA: Preoperative screening. Lower leg fracture after a
fall today. History of diabetes, hypertension, gastroesophageal
reflux disease, stroke, cancer.

EXAM:
CHEST  2 VIEW

[chest lat]
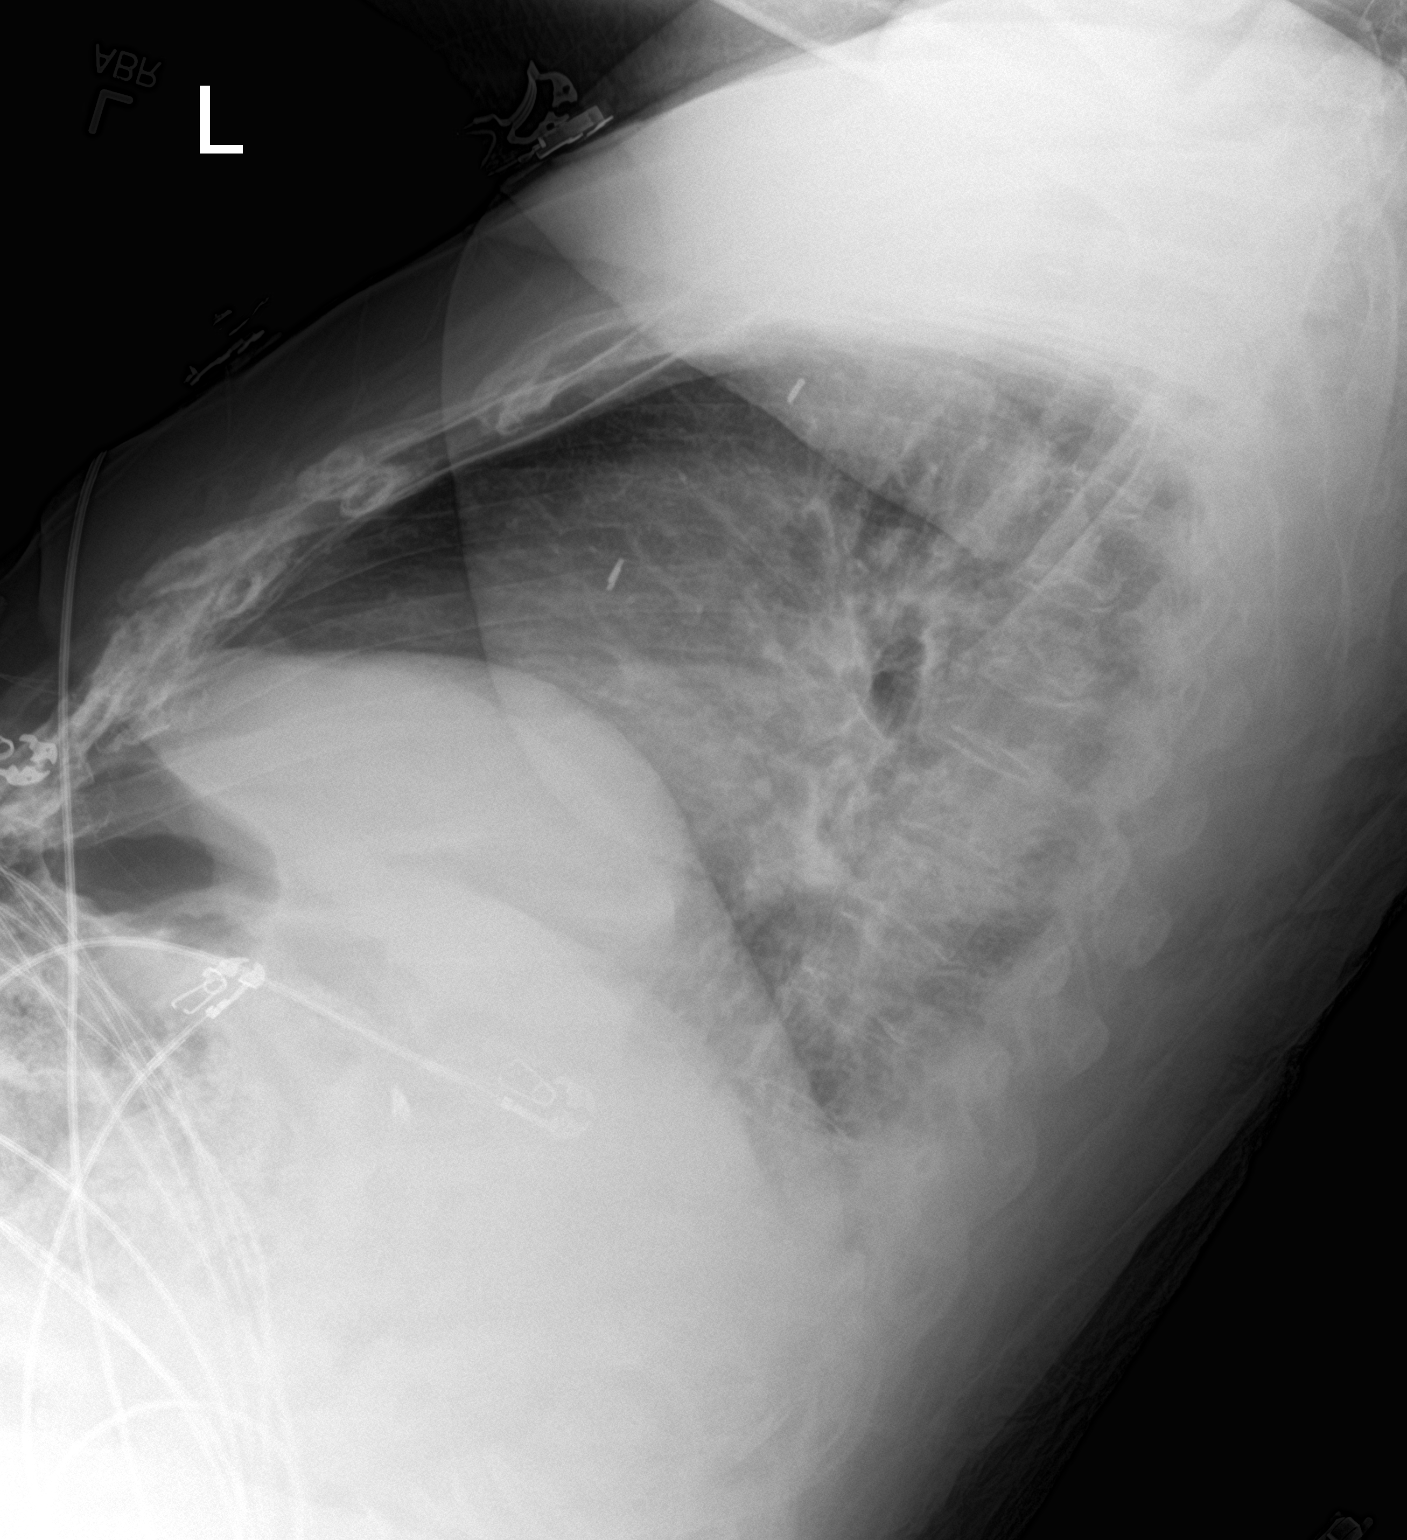

[chest ap]
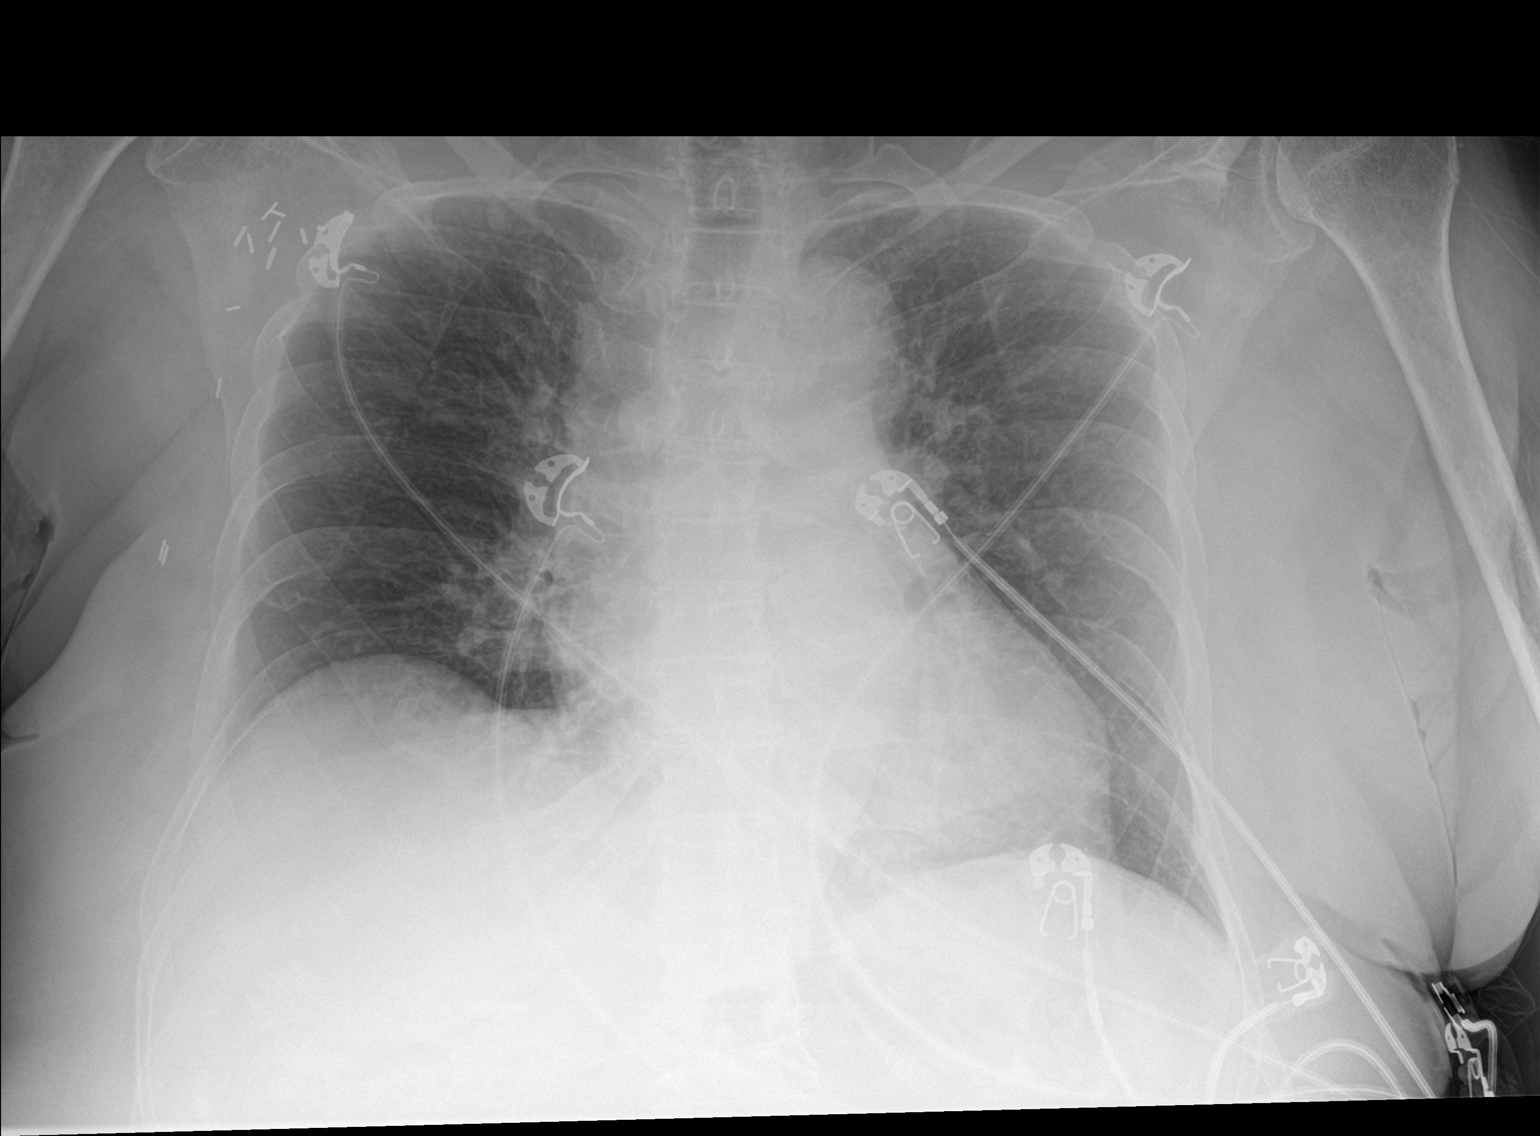

[2 of 2 positions shown; findings below may reference images not displayed]

FINDINGS: Shallow inspiration with elevation of the right hemidiaphragm. Heart
size and pulmonary vascularity are normal. No airspace disease or
consolidation in the lungs. No blunting of costophrenic angles. No
pneumothorax. Surgical clips in the right axilla. Compression of
midthoracic and upper lumbar vertebrae, as seen on previous chest CT
from 05/20/2013
IMPRESSION: No active cardiopulmonary disease.

## 2018-12-09 IMAGING — DX DG TIBIA/FIBULA 2V*R*
3 series · 3 of 3 positions shown · non-contrast
Comparison: None.

CLINICAL DATA: Patient status post fall.

EXAM:
RIGHT TIBIA AND FIBULA - 2 VIEW

[tibia ap (1 of 2)]
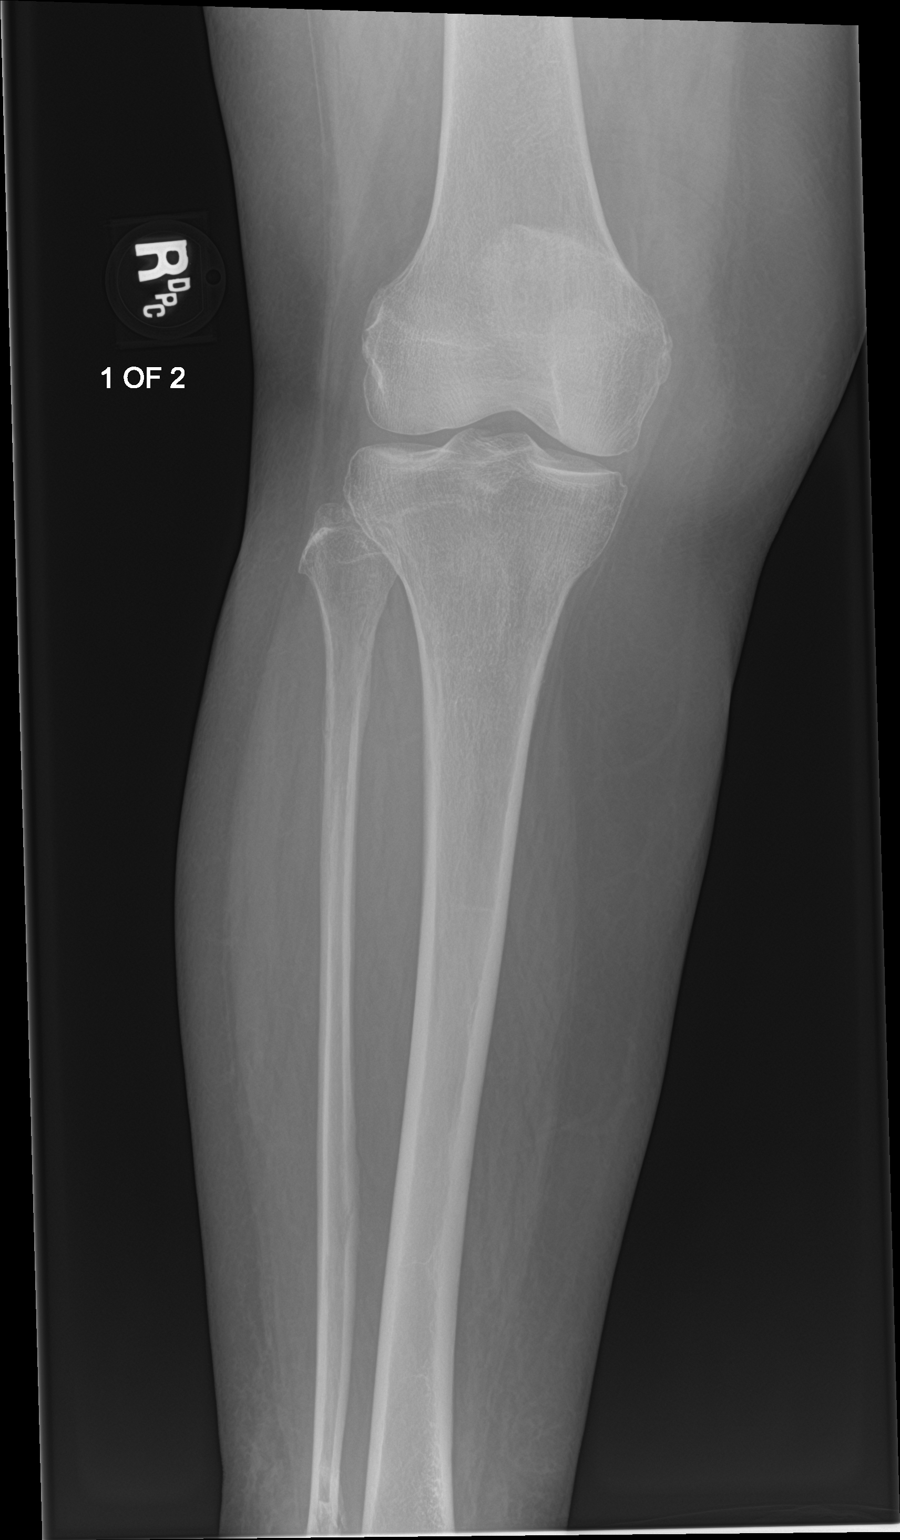

[tibia ap (2 of 2)]
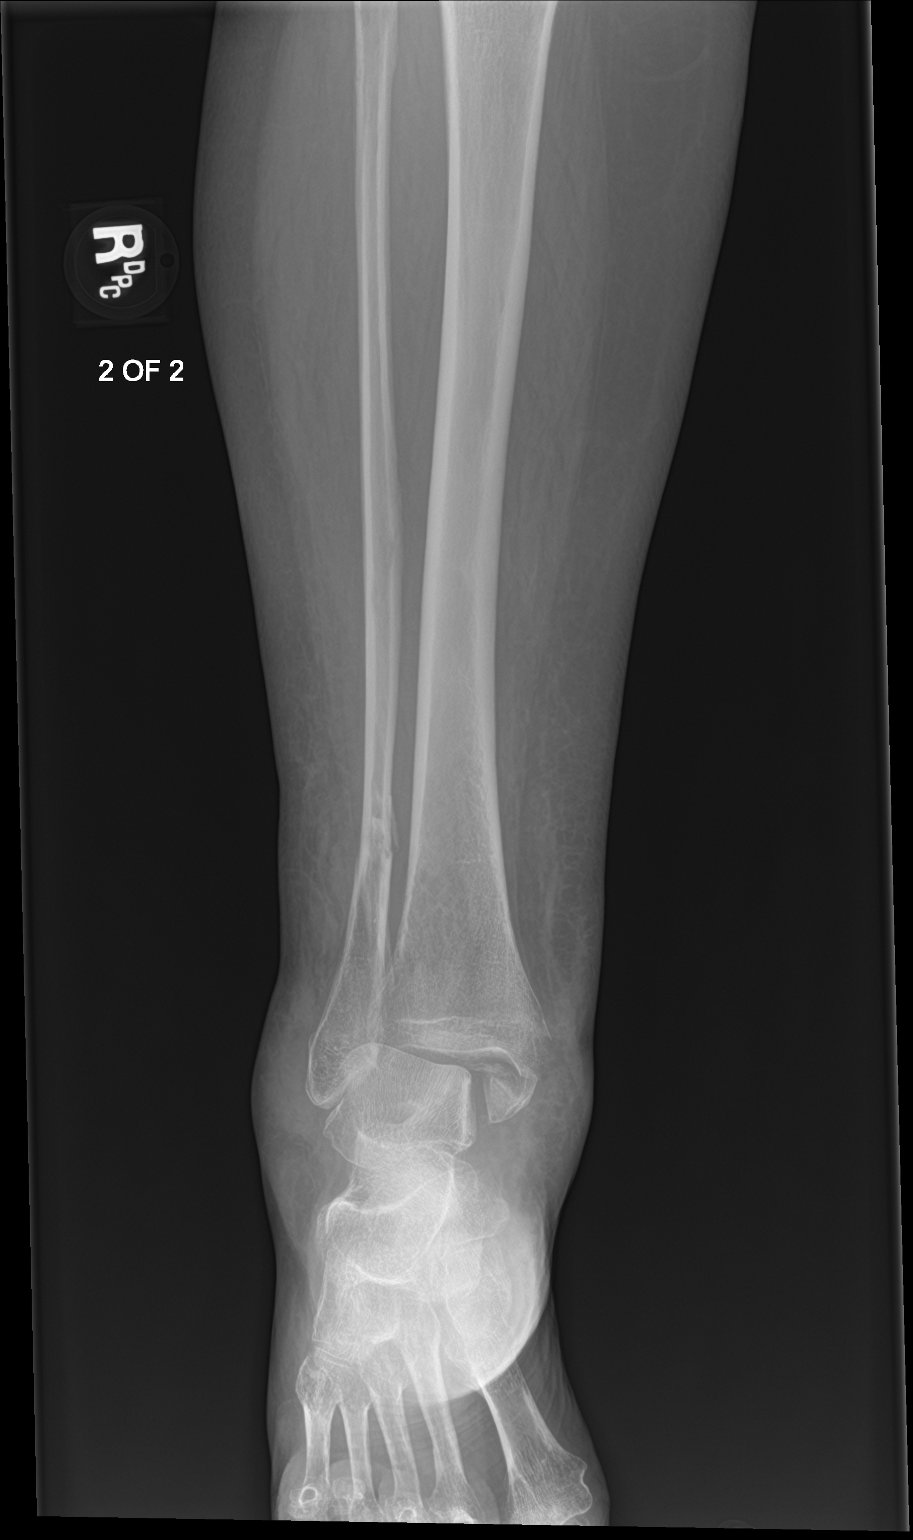

[tibia lat]
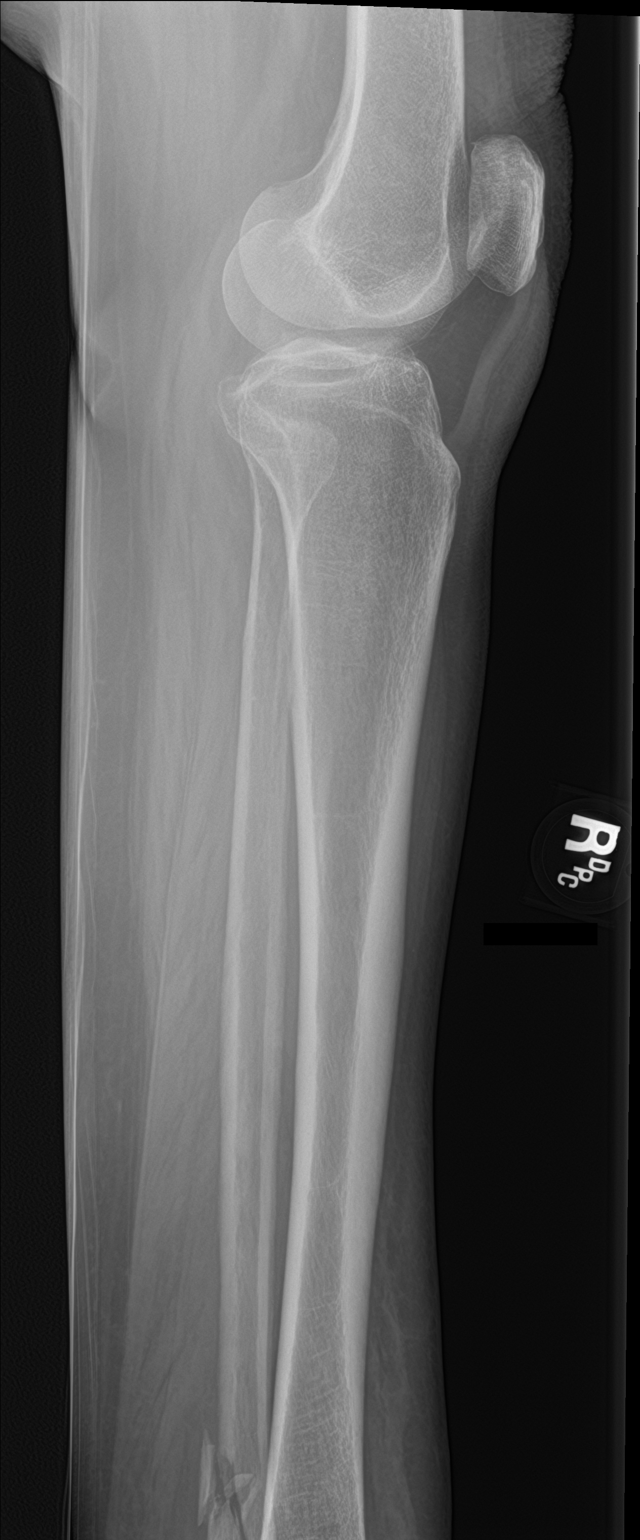

[3 of 3 positions shown; findings below may reference images not displayed]

FINDINGS: Comminuted oblique fracture through the distal fibula. Comminuted
medial malleolus fracture. Dislocation of the talus. Overlying soft
tissue swelling about the ankle. No evidence for acute fracture of
the proximal tibia or fibula. Knee joint degenerative changes.
IMPRESSION: See dedicated ankle radiographs for description of distal fibula
fracture, medial malleolus fracture and talar dislocation.

No evidence for proximal tibial or fibular fracture.

## 2018-12-10 IMAGING — RF DG ANKLE COMPLETE 3+V*R*
1 series · 10 of 10 positions shown · non-contrast
Comparison: 02/11/2017

CLINICAL DATA: Trimalleolar right ankle fracture

EXAM:
RIGHT ANKLE - COMPLETE 3+ VIEW

[Series 1: run · 10 of 10 slices shown]
[im 1/10]
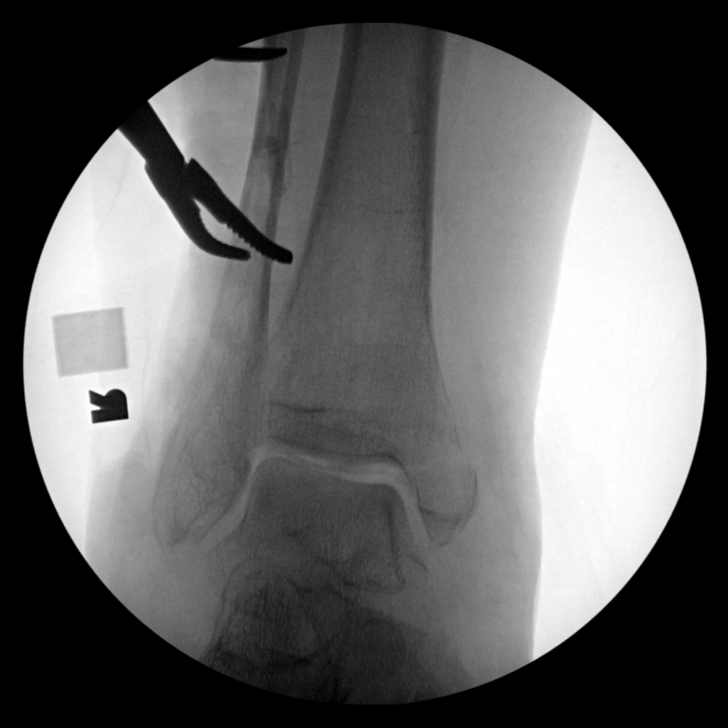
[im 2/10]
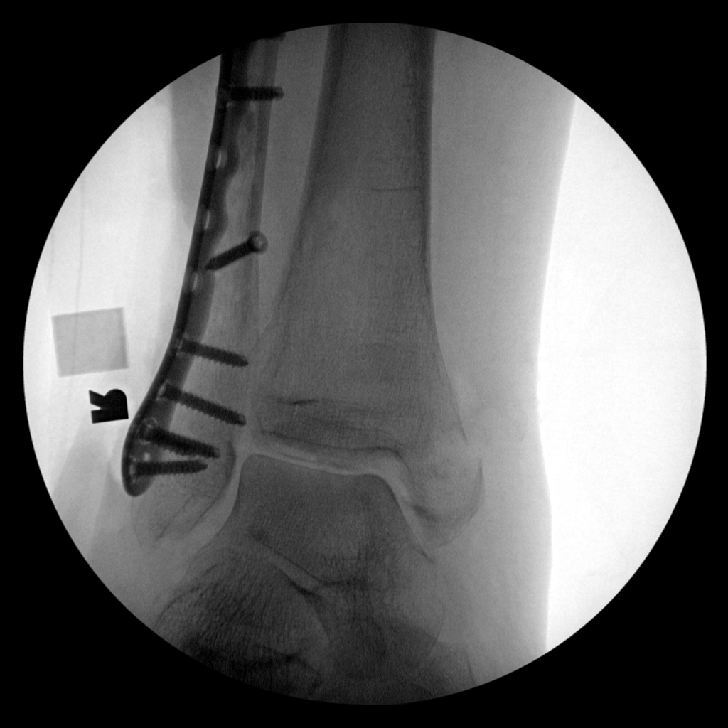
[im 3/10]
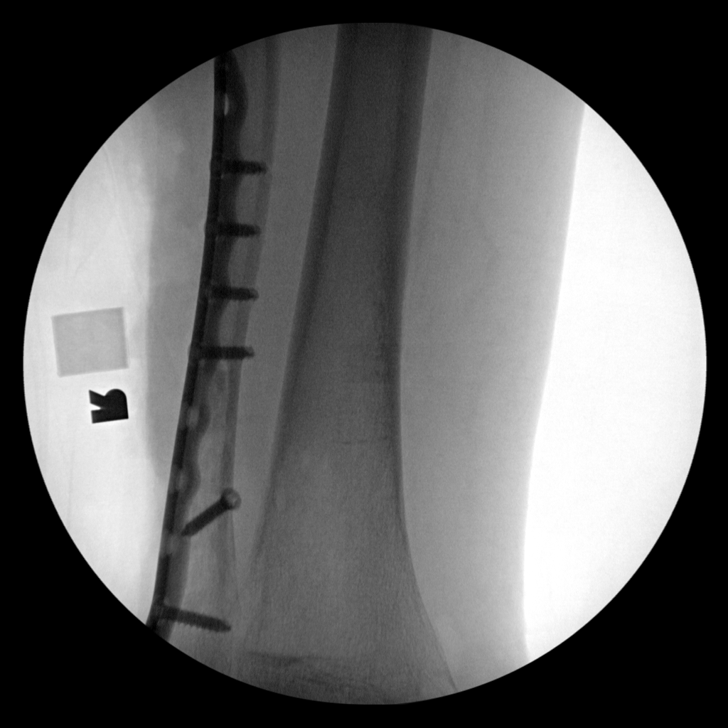
[im 4/10]
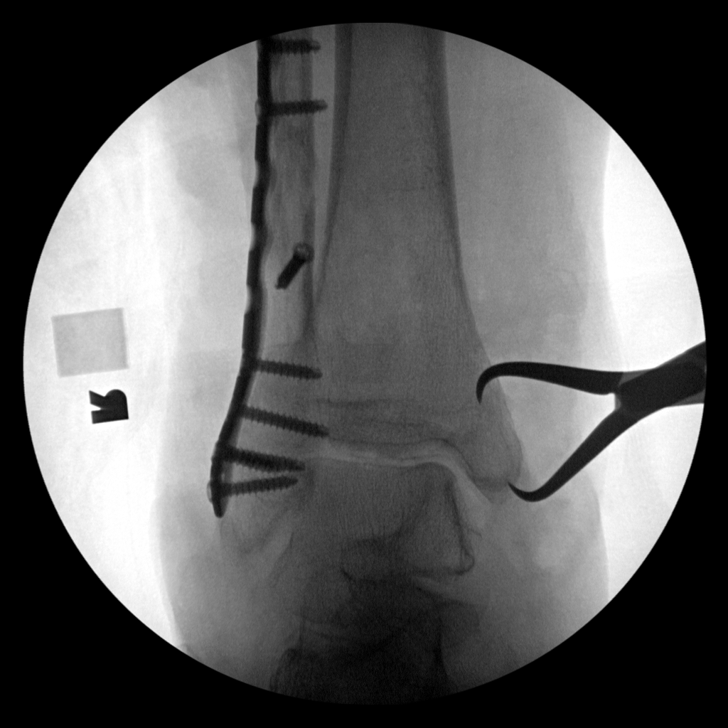
[im 5/10]
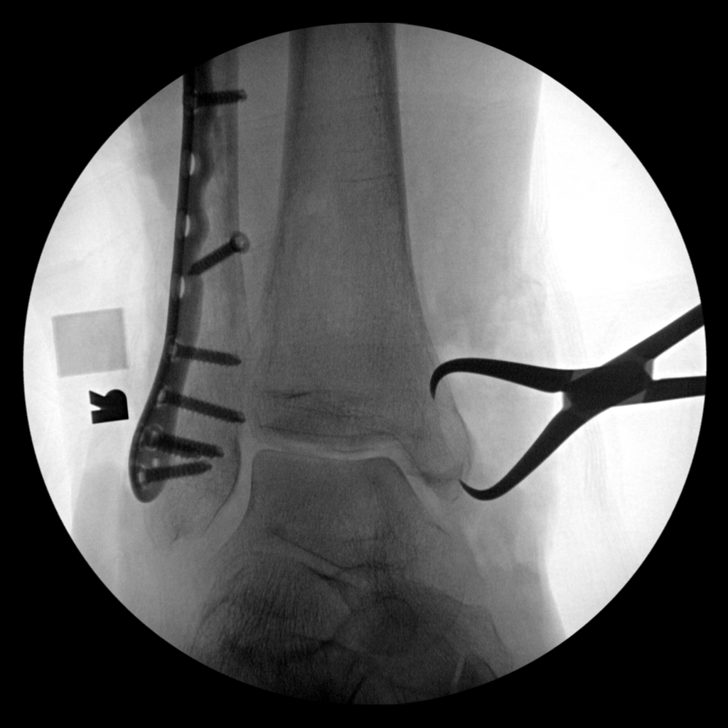
[im 6/10]
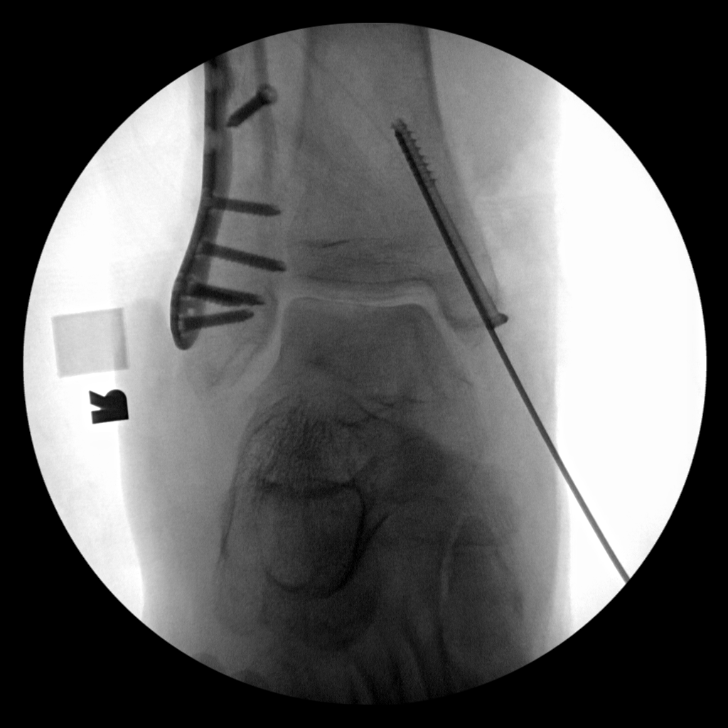
[im 7/10]
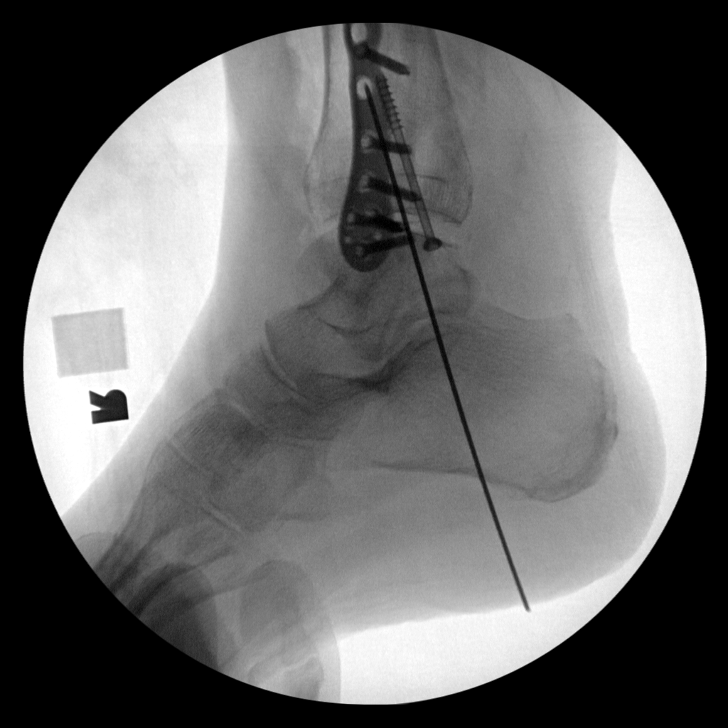
[im 8/10]
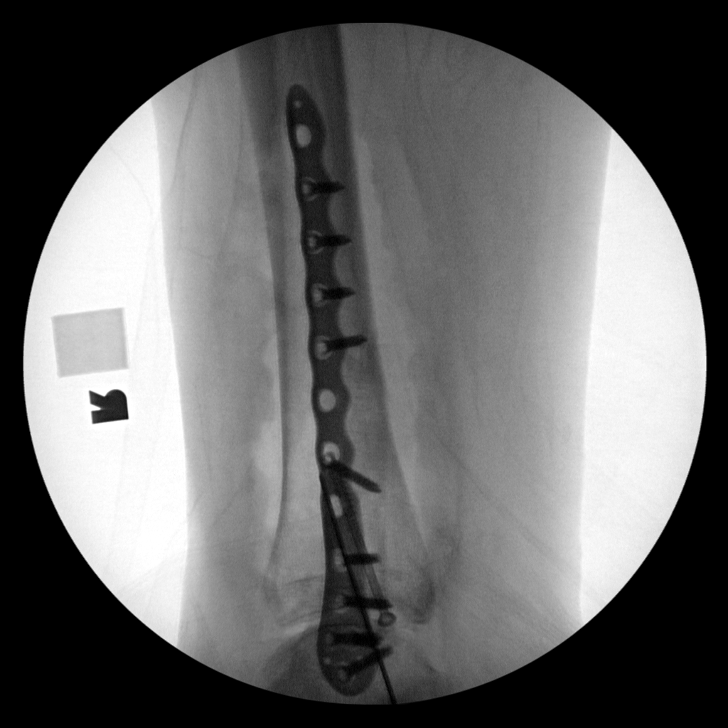
[im 9/10]
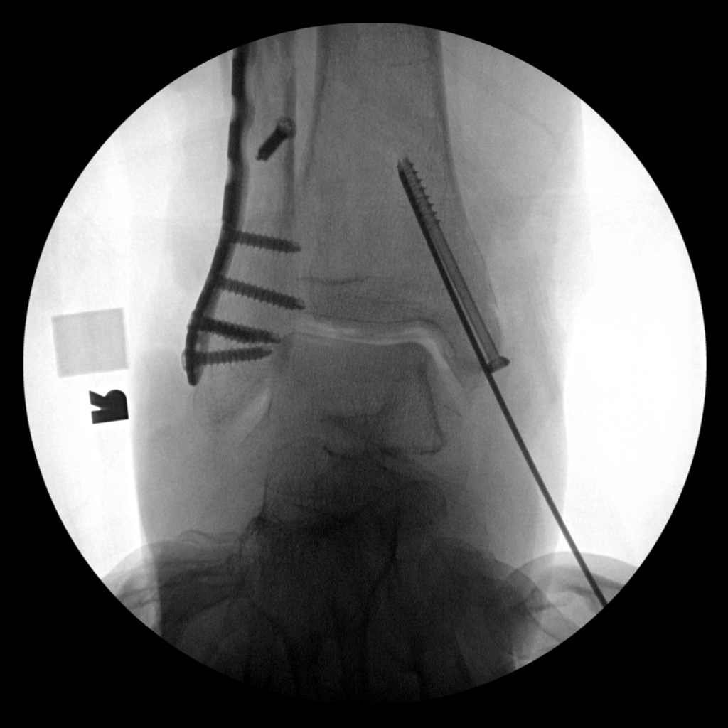
[im 10/10]
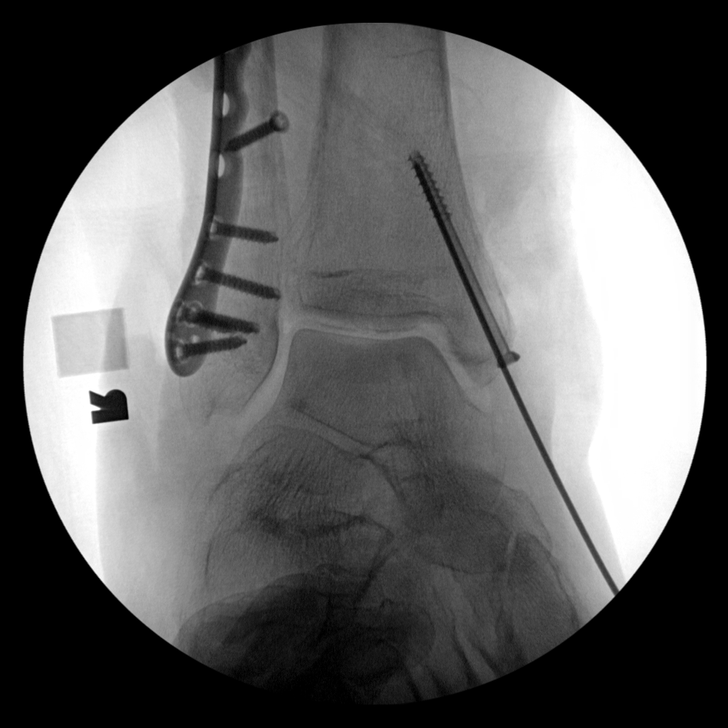

[10 of 10 positions shown; findings below may reference images not displayed]

FLUOROSCOPY TIME:  Radiation Exposure Index (as provided by the
fluoroscopic device): 2.05 mGy

If the device does not provide the exposure index:

Fluoroscopy Time:  30 seconds

Number of Acquired Images:  10
FINDINGS: Initial images again demonstrate the trimalleolar fracture which has
been significantly reduced. Fixation sideplate is subsequently noted
along the distal fibula with the fracture fragments in anatomic
alignment. Fixation screw and wire are noted traversing the medial
malleolus with the fracture fragments in anatomic alignment.
IMPRESSION: Status post ORIF of right ankle fracture

## 2019-03-01 ENCOUNTER — Ambulatory Visit: Payer: Medicare Other | Admitting: Family Medicine

## 2019-04-10 ENCOUNTER — Telehealth: Payer: Self-pay | Admitting: Family Medicine

## 2023-05-24 ENCOUNTER — Encounter: Payer: Self-pay | Admitting: Orthopedic Surgery

## 2024-03-06 DEATH — deceased
